# Patient Record
Sex: Male | Born: 1945 | Hispanic: Yes | Marital: Married | Smoking: Former smoker
Health system: Southern US, Community
[De-identification: ages and names within clinical notes are randomized; demographics above are authoritative.]

## PROBLEM LIST (undated history)

## (undated) DIAGNOSIS — E119 Type 2 diabetes mellitus without complications: Secondary | ICD-10-CM

## (undated) DIAGNOSIS — E785 Hyperlipidemia, unspecified: Secondary | ICD-10-CM

## (undated) DIAGNOSIS — I519 Heart disease, unspecified: Secondary | ICD-10-CM

## (undated) DIAGNOSIS — K219 Gastro-esophageal reflux disease without esophagitis: Secondary | ICD-10-CM

## (undated) DIAGNOSIS — H269 Unspecified cataract: Secondary | ICD-10-CM

## (undated) DIAGNOSIS — H409 Unspecified glaucoma: Secondary | ICD-10-CM

## (undated) DIAGNOSIS — M109 Gout, unspecified: Secondary | ICD-10-CM

## (undated) DIAGNOSIS — I10 Essential (primary) hypertension: Secondary | ICD-10-CM

## (undated) HISTORY — DX: Essential (primary) hypertension: I10

## (undated) HISTORY — DX: Hyperlipidemia, unspecified: E78.5

## (undated) HISTORY — DX: Heart disease, unspecified: I51.9

## (undated) HISTORY — DX: Gout, unspecified: M10.9

## (undated) HISTORY — DX: Unspecified glaucoma: H40.9

## (undated) HISTORY — DX: Gastro-esophageal reflux disease without esophagitis: K21.9

## (undated) HISTORY — DX: Type 2 diabetes mellitus without complications: E11.9

## (undated) HISTORY — PX: EYE SURGERY: SHX253

## (undated) HISTORY — PX: CHOLECYSTECTOMY: SHX55

## (undated) HISTORY — DX: Unspecified cataract: H26.9

---

## 2000-11-10 ENCOUNTER — Encounter: Payer: Self-pay | Admitting: Emergency Medicine

## 2000-11-11 ENCOUNTER — Inpatient Hospital Stay (HOSPITAL_COMMUNITY): Admission: EM | Admit: 2000-11-11 | Discharge: 2000-11-12 | Payer: Self-pay | Admitting: Emergency Medicine

## 2000-11-12 ENCOUNTER — Encounter: Payer: Self-pay | Admitting: Vascular Surgery

## 2005-10-10 ENCOUNTER — Emergency Department (HOSPITAL_COMMUNITY): Admission: EM | Admit: 2005-10-10 | Discharge: 2005-10-10 | Payer: Self-pay | Admitting: Emergency Medicine

## 2012-01-27 ENCOUNTER — Encounter (HOSPITAL_COMMUNITY): Payer: Self-pay | Admitting: Emergency Medicine

## 2012-01-27 ENCOUNTER — Emergency Department (HOSPITAL_COMMUNITY): Payer: Medicare Other

## 2012-01-27 ENCOUNTER — Emergency Department (HOSPITAL_COMMUNITY)
Admission: EM | Admit: 2012-01-27 | Discharge: 2012-01-27 | Disposition: A | Discharge status: Home / Self Care | Payer: Medicare Other | Attending: Emergency Medicine | Admitting: Emergency Medicine

## 2012-01-27 DIAGNOSIS — M549 Dorsalgia, unspecified: Secondary | ICD-10-CM | POA: Insufficient documentation

## 2012-01-27 DIAGNOSIS — Z79899 Other long term (current) drug therapy: Secondary | ICD-10-CM | POA: Insufficient documentation

## 2012-01-27 DIAGNOSIS — R109 Unspecified abdominal pain: Secondary | ICD-10-CM | POA: Insufficient documentation

## 2012-01-27 LAB — CBC WITH DIFFERENTIAL/PLATELET
Eosinophils Absolute: 0.1 10*3/uL (ref 0.0–0.7)
Eosinophils Relative: 2 % (ref 0–5)
HCT: 44 % (ref 39.0–52.0)
Hemoglobin: 14.9 g/dL (ref 13.0–17.0)
Lymphocytes Relative: 21 % (ref 12–46)
Lymphs Abs: 1.7 10*3/uL (ref 0.7–4.0)
MCH: 31.6 pg (ref 26.0–34.0)
MCV: 93.2 fL (ref 78.0–100.0)
Monocytes Absolute: 0.6 10*3/uL (ref 0.1–1.0)
Monocytes Relative: 7 % (ref 3–12)
Platelets: 228 10*3/uL (ref 150–400)
RBC: 4.72 MIL/uL (ref 4.22–5.81)
WBC: 8.3 10*3/uL (ref 4.0–10.5)

## 2012-01-27 LAB — URINALYSIS, MICROSCOPIC ONLY
Bilirubin Urine: NEGATIVE
Hgb urine dipstick: NEGATIVE
Nitrite: NEGATIVE
Protein, ur: NEGATIVE mg/dL
Urobilinogen, UA: 1 mg/dL (ref 0.0–1.0)

## 2012-01-27 LAB — LIPASE, BLOOD: Lipase: 24 U/L (ref 11–59)

## 2012-01-27 LAB — COMPREHENSIVE METABOLIC PANEL
ALT: 21 U/L (ref 0–53)
BUN: 15 mg/dL (ref 6–23)
CO2: 28 mEq/L (ref 19–32)
Calcium: 9.4 mg/dL (ref 8.4–10.5)
GFR calc Af Amer: >90 mL/min (ref >90)
GFR calc non Af Amer: 85 mL/min — ABNORMAL LOW (ref >90)
Glucose, Bld: 159 mg/dL — ABNORMAL HIGH (ref 70–99)
Sodium: 140 mEq/L (ref 135–145)

## 2012-01-27 MED ORDER — ONDANSETRON HCL 4 MG/2ML IJ SOLN
4.0000 mg | Freq: Once | INTRAMUSCULAR | Status: AC
  Administered 2012-01-27: 4 mg via INTRAVENOUS
  Filled 2012-01-27: qty 2

## 2012-01-27 MED ORDER — HYDROCODONE-ACETAMINOPHEN 5-500 MG PO TABS: 1.0000 | ORAL_TABLET | Freq: Four times a day (QID) | ORAL | Status: DC | PRN

## 2012-01-27 MED ORDER — HYDROMORPHONE HCL PF 1 MG/ML IJ SOLN
1.0000 mg | Freq: Once | INTRAMUSCULAR | Status: AC
  Administered 2012-01-27: 1 mg via INTRAVENOUS
  Filled 2012-01-27: qty 1

## 2012-01-27 NOTE — ED Notes (Signed)
Pt. C/o of right sided back and flank pain that occasionally shoots up to ribs with deep inspiration. X 1 week. Pt. Denies any urinary symptoms. Pt states he has burning with urination but this is normal for him. Pt. States he feels nauseous.

## 2012-01-27 NOTE — ED Provider Notes (Addendum)
History     CSN: [id number]  Arrival date & time 01/27/12  1234   First MD Initiated Contact with Patient 01/27/12 1343      Chief Complaint  Patient presents with  . Flank Pain    (Consider location/radiation/quality/duration/timing/severity/associated sxs/prior treatment) Patient is a 66 y.o. male presenting with flank pain. The history is provided by the patient.  Flank Pain Pertinent negatives include no chest pain, no abdominal pain, no headaches and no shortness of breath.  in past week pt c/o pain right mid to upper flank laterally and posteriorly. Non radiating. Constant, dull. Moderate to severe. Is constant but waxes and wanes in severity. No specific exacerbating or alleviating factors.  ?similar pain years ago with kidney stone. Has had gallbladder removed in past, denies other abd surgery. Nausea. No vomiting. Having normal bms. No constipation or diarrhea. No abd or flank trauma or strain. No cough or uri c/o. No fever or chills. No cp or sob. No lower abd pain, no scrotal or testicular pain or swelling.   History reviewed. No pertinent past medical history.  Past Surgical History  Procedure Date  . Cholecystectomy     History reviewed. No pertinent family history.  History  Substance Use Topics  . Smoking status: Never Smoker   . Smokeless tobacco: Not on file  . Alcohol Use: Yes     occasion      Review of Systems  Constitutional: Negative for fever.  HENT: Negative for neck pain.   Eyes: Negative for redness.  Respiratory: Negative for shortness of breath.   Cardiovascular: Negative for chest pain.  Gastrointestinal: Negative for abdominal pain.  Genitourinary: Positive for flank pain. Negative for dysuria and hematuria.  Musculoskeletal: Negative for back pain.  Skin: Negative for rash.  Neurological: Negative for headaches.  Hematological: Does not bruise/bleed easily.  Psychiatric/Behavioral: Negative for confusion.    Allergies   Penicillins  Home Medications   Current Outpatient Rx  Name Route Sig Dispense Refill  . BC HEADACHE PO Oral Take 1 packet by mouth daily as needed. For pain    . INDOMETHACIN 25 MG PO CAPS Oral Take 25 mg by mouth 3 (three) times daily with meals.    Marland Kitchen HYDROCODONE-ACETAMINOPHEN 5-500 MG PO TABS Oral Take 1-2 tablets by mouth every 6 (six) hours as needed for pain. 20 tablet 0    BP 141/88  Pulse 72  Temp 99.1 F (37.3 C) (Oral)  Resp 18  SpO2 96%  Physical Exam  Nursing note and vitals reviewed. Constitutional: He is oriented to person, place, and time. He appears well-developed and well-nourished. No distress.  HENT:  Head: Atraumatic.  Eyes: Pupils are equal, round, and reactive to light.  Neck: Neck supple. No tracheal deviation present.  Cardiovascular: Normal rate, regular rhythm, normal heart sounds and intact distal pulses.   Pulmonary/Chest: Effort normal and breath sounds normal. No accessory muscle usage. No respiratory distress. He exhibits no tenderness.  Abdominal: Soft. Bowel sounds are normal. He exhibits no distension and no mass. There is no tenderness. There is no rebound and no guarding.  Genitourinary:       No cva tenderness. No scrotal/testicular pain or tenderness. No hernia.   Musculoskeletal: Normal range of motion. He exhibits no edema and no tenderness.  Neurological: He is alert and oriented to person, place, and time.  Skin: Skin is warm and dry. No rash noted.       No rash/shingles in area of pain  Psychiatric: He  has a normal mood and affect.    ED Course  Procedures (including critical care time)  Results for orders placed during the hospital encounter of 01/27/12  CBC WITH DIFFERENTIAL      Component Value Range   WBC 8.3  4.0 - 10.5 K/uL   RBC 4.72  4.22 - 5.81 MIL/uL   Hemoglobin 14.9  13.0 - 17.0 g/dL   HCT 81.1  91.4 - 78.2 %   MCV 93.2  78.0 - 100.0 fL   MCH 31.6  26.0 - 34.0 pg   MCHC 33.9  30.0 - 36.0 g/dL   RDW 95.6  21.3  - 08.6 %   Platelets 228  150 - 400 K/uL   Neutrophils Relative 70  43 - 77 %   Neutro Abs 5.8  1.7 - 7.7 K/uL   Lymphocytes Relative 21  12 - 46 %   Lymphs Abs 1.7  0.7 - 4.0 K/uL   Monocytes Relative 7  3 - 12 %   Monocytes Absolute 0.6  0.1 - 1.0 K/uL   Eosinophils Relative 2  0 - 5 %   Eosinophils Absolute 0.1  0.0 - 0.7 K/uL   Basophils Relative 1  0 - 1 %   Basophils Absolute 0.0  0.0 - 0.1 K/uL  COMPREHENSIVE METABOLIC PANEL      Component Value Range   Sodium 140  135 - 145 mEq/L   Potassium 3.9  3.5 - 5.1 mEq/L   Chloride 103  96 - 112 mEq/L   CO2 28  19 - 32 mEq/L   Glucose, Bld 159 (*) 70 - 99 mg/dL   BUN 15  6 - 23 mg/dL   Creatinine, Ser 5.78  0.50 - 1.35 mg/dL   Calcium 9.4  8.4 - 46.9 mg/dL   Total Protein 7.4  6.0 - 8.3 g/dL   Albumin 4.1  3.5 - 5.2 g/dL   AST 17  0 - 37 U/L   ALT 21  0 - 53 U/L   Alkaline Phosphatase 91  39 - 117 U/L   Total Bilirubin 0.5  0.3 - 1.2 mg/dL   GFR calc non Af Amer 85 (*) >90 mL/min   GFR calc Af Amer >90  >90 mL/min  LIPASE, BLOOD      Component Value Range   Lipase 24  11 - 59 U/L  URINALYSIS, MICROSCOPIC ONLY      Component Value Range   Color, Urine YELLOW  YELLOW   APPearance CLEAR  CLEAR   Specific Gravity, Urine 1.026  1.005 - 1.030   pH 8.0  5.0 - 8.0   Glucose, UA NEGATIVE  NEGATIVE mg/dL   Hgb urine dipstick NEGATIVE  NEGATIVE   Bilirubin Urine NEGATIVE  NEGATIVE   Ketones, ur NEGATIVE  NEGATIVE mg/dL   Protein, ur NEGATIVE  NEGATIVE mg/dL   Urobilinogen, UA 1.0  0.0 - 1.0 mg/dL   Nitrite NEGATIVE  NEGATIVE   Leukocytes, UA NEGATIVE  NEGATIVE   Squamous Epithelial / LPF RARE  RARE   Ct Abdomen Pelvis Wo Contrast  01/27/2012  *RADIOLOGY REPORT*  Clinical Data: Right flank pain.  History of kidney stones.  CT ABDOMEN AND PELVIS WITHOUT CONTRAST  Technique:  Multidetector CT imaging of the abdomen and pelvis was performed following the standard protocol without intravenous contrast.  Comparison: None.  Findings:  The lung bases are clear.  No pericardial or pleural effusion noted.  The heart size appears normal.  Normal appearance of the liver.  Prior cholecystectomy.  No biliary dilatation.  The pancreas is normal.  The spleen is unremarkable.  Both adrenal glands are normal.  The right kidney is normal.  No right-sided hydronephrosis or nephrolithiasis.  The left kidney is also normal without evidence for nephrolithiasis or hydronephrosis. Urinary bladder appears within normal limits.  Calcification within the central portion of the prostate gland is noted.  The seminal vesicles appear unremarkable.  No enlarged upper abdominal lymph nodes.  There is no pelvic or inguinal adenopathy.  No ascites or fluid collections within the abdomen or pelvis.  The stomach is normal.  The small bowel loops are unremarkable. The appendix is visualized and appears normal.  Normal appearance of the colon.  Review of the visualized bony structures is significant for mild degenerative disc disease.  No acute bony abnormalities identified.  IMPRESSION:  1.  No acute findings identified within the abdomen or pelvis. 2.  No evidence for renal calculi or obstructive uropathy.   Original Report Authenticated By: Signa Kell, M.D.    Dg Chest 2 View  01/27/2012  *RADIOLOGY REPORT*  Clinical Data: Pain  CHEST - 2 VIEW  Comparison: 10/10/2005  Findings: The heart size and mediastinal contours are within normal limits.  Both lungs are clear.  The visualized skeletal structures are unremarkable.  IMPRESSION: No active cardiopulmonary abnormalities.   Original Report Authenticated By: Signa Kell, M.D.         MDM  Iv ns. Dilaudid 1 mg iv. Labs. Ct.  Reviewed nursing notes and prior charts for additional history.    Ct negative for acute process. On recheck pts pain mainly posterior/back, +pain w turning torso and palpation area. ?musculoskeletal pain. Chest cta. abd soft nt. No rash/shingles to area. Spine nt.         Suzi Roots, MD 01/27/12 1540  Suzi Roots, MD 01/27/12 1556

## 2012-01-27 NOTE — ED Notes (Signed)
Pt reports R flank pain for about a week that radiates to RUQ; pt reports nausea, denies dysuria

## 2015-01-19 DIAGNOSIS — H11003 Unspecified pterygium of eye, bilateral: Secondary | ICD-10-CM | POA: Insufficient documentation

## 2015-05-03 DIAGNOSIS — K219 Gastro-esophageal reflux disease without esophagitis: Secondary | ICD-10-CM | POA: Diagnosis not present

## 2015-05-03 DIAGNOSIS — N400 Enlarged prostate without lower urinary tract symptoms: Secondary | ICD-10-CM | POA: Diagnosis not present

## 2015-05-03 DIAGNOSIS — I10 Essential (primary) hypertension: Secondary | ICD-10-CM | POA: Diagnosis not present

## 2015-05-03 DIAGNOSIS — M109 Gout, unspecified: Secondary | ICD-10-CM | POA: Diagnosis not present

## 2015-05-03 DIAGNOSIS — E119 Type 2 diabetes mellitus without complications: Secondary | ICD-10-CM | POA: Diagnosis not present

## 2015-05-05 DIAGNOSIS — I10 Essential (primary) hypertension: Secondary | ICD-10-CM | POA: Diagnosis not present

## 2015-05-05 DIAGNOSIS — E559 Vitamin D deficiency, unspecified: Secondary | ICD-10-CM | POA: Diagnosis not present

## 2015-05-05 DIAGNOSIS — E119 Type 2 diabetes mellitus without complications: Secondary | ICD-10-CM | POA: Diagnosis not present

## 2015-05-05 DIAGNOSIS — M109 Gout, unspecified: Secondary | ICD-10-CM | POA: Diagnosis not present

## 2015-05-05 DIAGNOSIS — R5383 Other fatigue: Secondary | ICD-10-CM | POA: Diagnosis not present

## 2015-09-08 DIAGNOSIS — H5713 Ocular pain, bilateral: Secondary | ICD-10-CM | POA: Diagnosis not present

## 2015-09-08 DIAGNOSIS — H04123 Dry eye syndrome of bilateral lacrimal glands: Secondary | ICD-10-CM | POA: Diagnosis not present

## 2015-09-08 DIAGNOSIS — H40043 Steroid responder, bilateral: Secondary | ICD-10-CM | POA: Diagnosis not present

## 2015-09-08 DIAGNOSIS — H00015 Hordeolum externum left lower eyelid: Secondary | ICD-10-CM | POA: Diagnosis not present

## 2015-10-18 DIAGNOSIS — H04123 Dry eye syndrome of bilateral lacrimal glands: Secondary | ICD-10-CM | POA: Diagnosis not present

## 2015-10-18 DIAGNOSIS — H00015 Hordeolum externum left lower eyelid: Secondary | ICD-10-CM | POA: Diagnosis not present

## 2015-10-18 DIAGNOSIS — H5713 Ocular pain, bilateral: Secondary | ICD-10-CM | POA: Diagnosis not present

## 2015-10-18 DIAGNOSIS — H40043 Steroid responder, bilateral: Secondary | ICD-10-CM | POA: Diagnosis not present

## 2016-01-19 DIAGNOSIS — H401131 Primary open-angle glaucoma, bilateral, mild stage: Secondary | ICD-10-CM | POA: Diagnosis not present

## 2016-01-19 DIAGNOSIS — H2513 Age-related nuclear cataract, bilateral: Secondary | ICD-10-CM | POA: Diagnosis not present

## 2016-01-19 DIAGNOSIS — H5713 Ocular pain, bilateral: Secondary | ICD-10-CM | POA: Diagnosis not present

## 2016-01-19 DIAGNOSIS — H40043 Steroid responder, bilateral: Secondary | ICD-10-CM | POA: Diagnosis not present

## 2016-02-26 DIAGNOSIS — Z23 Encounter for immunization: Secondary | ICD-10-CM | POA: Diagnosis not present

## 2016-09-14 ENCOUNTER — Ambulatory Visit (INDEPENDENT_AMBULATORY_CARE_PROVIDER_SITE_OTHER): Payer: Medicare Other | Admitting: Physician Assistant

## 2016-09-14 ENCOUNTER — Encounter: Payer: Self-pay | Admitting: Physician Assistant

## 2016-09-14 VITALS — BP 108/70 | HR 81 | Temp 97.8°F | Resp 16 | Ht 63.0 in | Wt 234.8 lb

## 2016-09-14 DIAGNOSIS — Z0000 Encounter for general adult medical examination without abnormal findings: Secondary | ICD-10-CM

## 2016-09-14 DIAGNOSIS — E119 Type 2 diabetes mellitus without complications: Secondary | ICD-10-CM | POA: Insufficient documentation

## 2016-09-14 DIAGNOSIS — Z7689 Persons encountering health services in other specified circumstances: Secondary | ICD-10-CM

## 2016-09-14 DIAGNOSIS — I10 Essential (primary) hypertension: Secondary | ICD-10-CM

## 2016-09-14 DIAGNOSIS — Z23 Encounter for immunization: Secondary | ICD-10-CM | POA: Diagnosis not present

## 2016-09-14 NOTE — Progress Notes (Signed)
Patient ID: Patrick Marshall MRN: [id number], DOB: Feb 14, 1946 71 y.o. Date of Encounter: 09/14/2016, 11:12 AM    Chief Complaint: Physical (CPE)  HPI: 71 y.o. y/o male here for CPE.   He presents as a new patient to establish care. He had been seeing Delia Chimes but her practice is closed so he is here to establish.  His daughter who is 69 years old is here with him for visit. He speaks some [place] but she speaks more [place]. She helps to translate.  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  Daughter states that she is 19 years old and at home it is the 2 of them. Patient states that he also has another daughter who lives in [place] but she has her own family and is usually too busy to come with him to his appointments.  He has diabetes and hypertension. These are his only known medical problems.  I asked about eye exam. He states that he his last eye exam was 4 or 5 months ago. States that he sees someone in [place]. Says that they always call him when he is due for another visit.  He has no specific complaints or concerns to address today.   Review of Systems: Consitutional: No fever, chills, fatigue, night sweats, lymphadenopathy, or weight changes. Eyes: No visual changes, eye redness, or discharge. ENT/Mouth: Ears: No otalgia, tinnitus, hearing loss, discharge. Nose: No congestion, rhinorrhea, sinus pain, or epistaxis. Throat: No sore throat, post nasal drip, or teeth pain. Cardiovascular: No CP, palpitations, diaphoresis, DOE, edema, orthopnea, PND. Respiratory: No cough, hemoptysis, SOB, or wheezing. Gastrointestinal: No anorexia, dysphagia, reflux, pain, nausea, vomiting, hematemesis, diarrhea, constipation, BRBPR, or melena. Genitourinary: No dysuria, frequency, urgency, hematuria, incontinence, nocturia, decreased urinary stream, discharge, impotence, or testicular pain/masses. Musculoskeletal: No  decreased ROM, myalgias, stiffness, joint swelling, or weakness. Skin: No rash, erythema, lesion changes, pain, warmth, jaundice, or pruritis. Neurological: No headache, dizziness, syncope, seizures, tremors, memory loss, coordination problems, or paresthesias. Psychological: No anxiety, depression, hallucinations, SI/HI. Endocrine: Known diabetes. All other systems were reviewed and are otherwise negative.  Past Medical History:  Diagnosis Date  . Diabetes mellitus without complication ([place])   . Hypertension      Past Surgical History:  Procedure Laterality Date  . CHOLECYSTECTOMY    . EYE SURGERY     2016 both eyes    Home Meds:  Outpatient Medications Prior to Visit  Medication Sig Dispense Refill  . Aspirin-Salicylamide-Caffeine (BC HEADACHE PO) Take 1 packet by mouth daily as needed. For pain    . HYDROcodone-acetaminophen (VICODIN) 5-500 MG per tablet Take 1-2 tablets by mouth every 6 (six) hours as needed for pain. 20 tablet 0  . indomethacin (INDOCIN) 25 MG capsule Take 25 mg by mouth 3 (three) times daily with meals.     No facility-administered medications prior to visit.     Allergies:  Allergies  Allergen Reactions  . Penicillins Other (See Comments)    Difficulty sleeping    Social History   Social History  . Marital status: Married    Spouse name: N/A  . Number of children: N/A  . Years of education: N/A   Occupational History  . Not on file.   Social History Main Topics  . Smoking status: Never Smoker  . Smokeless tobacco: Never Used  . Alcohol use Yes     Comment: occasion  . Drug use: No  .  Sexual activity: Not on file   Other Topics Concern  . Not on file   Social History Narrative  . No narrative on file    No family history on file.  Physical Exam: Blood pressure 108/70, pulse 81, temperature 97.8 F (36.6 C), temperature source Oral, resp. rate 16, height 5\' 3"  (1.6 m), weight 234 lb 12.8 oz (106.5 kg), SpO2 97 %.  General:  Mildly obese Hispanic Male. Appears in no acute distress. HEENT: Normocephalic, atraumatic. Conjunctiva pink, sclera non-icteric. Pupils 2 mm constricting to 1 mm, round, regular, and equally reactive to light and accomodation. EOMI. Internal auditory canal clear. TMs with good cone of light and without pathology. Nasal mucosa pink. Nares are without discharge. No sinus tenderness. Oral mucosa pink. Pharynx without exudate.   Neck: Supple. Trachea midline. No thyromegaly. Full ROM. No lymphadenopathy. No carotid bruit. Lungs: Clear to auscultation bilaterally without wheezes, rales, or rhonchi. Breathing is of normal effort and unlabored. Cardiovascular: RRR with S1 S2. No murmurs, rubs, or gallops. No carotid or abdominal bruits. Abdomen: Soft, non-tender, non-distended with normoactive bowel sounds. No hepatosplenomegaly or masses. No rebound/guarding. No CVA tenderness. No hernias. Musculoskeletal: Full range of motion and 5/5 strength throughout.  Skin: Warm and moist without erythema, ecchymosis, wounds, or rash. Neuro: A+Ox3. CN II-XII grossly intact. Moves all extremities spontaneously. Full sensation throughout. Normal gait.  Diabetic foot exam: Inspection is normal. He has no wounds. No problem areas. He has no palpable dorsalis pedis or posterior tibial pulses bilaterally. Psych:  Responds to questions appropriately with a normal affect.   Assessment/Plan:  71 y.o. y/o  male here for CPE  -1. Encounter for preventive health examination A. Screening Labs: - CBC with Differential/Platelet; Future - COMPLETE METABOLIC PANEL WITH GFR; Future - Lipid panel; Future - TSH; Future - PSA; Future - Hemoglobin A1c; Future   B. Screening For Prostate Cancer: - PSA; Future  C. Screening For Colorectal Cancer:  He states that he has never had a colonoscopy. Discussed with them recommendations for colorectal cancer screening and fact that colonoscopy is the best test to evaluate for this. He  is agreeable to see GI for colonoscopy. - Ambulatory referral to Gastroenterology  D. Immunizations: Flu------------------N/A Tetanus-------- he states that he has had no tetanus vaccine and at least 10 years. He is agreeable to get this today especially given his work on a farm highly recommended. T dap given here 09/14/16 Pneumococcal--- given Pneumovax 23 today. Give Prevnar 13 6-12 months. Shingrix-----will wait to discuss this at future visits since we have so much to address today.   2. Medicare annual wellness visit, subsequent  - CBC with Differential/Platelet; Future - COMPLETE METABOLIC PANEL WITH GFR; Future - Lipid panel; Future - TSH; Future - PSA; Future - Hemoglobin A1c; Future  3. Controlled type 2 diabetes mellitus without complication, without long-term current use of insulin ([place])  He is on metformin. He is on ACE inhibitor. -----------AT NEXT OV--WILL DISCUSS ASPIRIN-----ALSO WILL F/U RESULTS OF FLP---ADD STATIN IF LDL > 70------------   - Hemoglobin A1c; Future -MicroAlbumin  Today I discussed proper foot care and he voices understanding and agrees.  He is already having routine eye exams.   4. Essential hypertension Blood pressure is at goal. Will continue current medication. Will check lab to monitor. - COMPLETE METABOLIC PANEL WITH GFR; Future   Will schedule routine follow-up visit 3 months. Follow-up sooner if needed.   Subjective:   Patient presents for Medicare Annual/Subsequent preventive examination.  Review Past Medical/Family/Social: All of this information was reviewed today.  Risk Factors  Current exercise habits: He works on a tobacco farm. Dietary issues discussed: He has been educated regarding diabetic diet.  Cardiac risk factors: Obesity, diabetes, hypertension  Depression Screen  (Note: if answer to either of the following is "Yes", a more complete depression screening is indicated)  Over the past two weeks, have you felt  down, depressed or hopeless? No Over the past two weeks, have you felt little interest or pleasure in doing things? No Have you lost interest or pleasure in daily life? No Do you often feel hopeless? No Do you cry easily over simple problems? No   Activities of Daily Living  In your present state of health, do you have any difficulty performing the following activities?:  Driving? No  Managing money? No  Feeding yourself? No  Getting from bed to chair? No  Climbing a flight of stairs? No  Preparing food and eating?: No  Bathing or showering? No  Getting dressed: No  Getting to the toilet? No  Using the toilet:No  Moving around from place to place: No  In the past year have you fallen or had a near fall?:No  Are you sexually active? No  Do you have more than one partner? No   Hearing Difficulties: No  Do you often ask people to speak up or repeat themselves? No  Do you experience ringing or noises in your ears? No Do you have difficulty understanding soft or whispered voices? No  Do you feel that you have a problem with memory? No Do you often misplace items? No  Do you feel safe at home? Yes  Cognitive Testing  Alert? Yes Normal Appearance?Yes  Oriented to person? Yes Place? Yes  Time? Yes  Recall of three objects? Yes  Can perform simple calculations? Yes  Displays appropriate judgment?Yes  Can read the correct time from a watch face?Yes   List the Names of Other Physician/Practitioners you currently use:  He sees an eye doctor. This is the only other practitioner he sees.  Indicate any recent Medical Services you may have received from other than [facility] providers in the past year (date may be approximate).  None Screening Tests / Date Colonoscopy  ----so far he has never had a colonoscopy. Today I have referred him to GI for colonoscopy.                   Zostavax --------- will discuss shingles vaccine at upcoming visit. 2 much to discuss today. Mammogram  --n/a Influenza Vaccine --na Tetanus/tdap----T dap given today 09/14/16     Assessment:    Annual wellness medicare exam   Plan:    During the course of the visit the patient was educated and counseled about appropriate screening and preventive services including:  Screening mammography  Colorectal cancer screening  Shingles vaccine. Prescription given to that she can get the vaccine at the pharmacy or Medicare part D.  Screen + for depression. 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 We discussed the options of counseling versus possibly a medication. I encouraged her strongly think about the counseling. She is going through some medical problems currently and her husband is as well Mrs. been very stressful for her. She says she will think about it. She does have Xanax to use as needed. Though she may benefit from an SSRI for her more depressive type symptoms but she wants to hold off at this time.  I aksed her to please have her cardioloist send records since we have none on file.  Diet review for nutrition referral? Yes ____ Not Indicated __x__  Patient Instructions (the written plan) was given to the patient.  Medicare Attestation  I have personally reviewed:  The patient's medical and social history  Their use of alcohol, tobacco or illicit drugs  Their current medications and supplements  The patient's functional ability including ADLs,fall risks, home safety risks, cognitive, and hearing and visual impairment  Diet and physical activities  Evidence for depression or mood disorders  The patient's weight, height, BMI, and visual acuity have been recorded in the chart. I have made referrals, counseling, and provided education to the patient based on review of the above and I have provided the patient with a written personalized care plan for preventive services.        Signed:   [address] [place], [place]  09/14/2016 11:12 AM

## 2016-09-14 NOTE — Addendum Note (Signed)
Addended by: Vonna Kotyk A on: 09/14/2016 05:14 PM   Modules accepted: Orders

## 2016-09-15 ENCOUNTER — Other Ambulatory Visit: Payer: Medicare Other

## 2016-09-15 DIAGNOSIS — E119 Type 2 diabetes mellitus without complications: Secondary | ICD-10-CM | POA: Diagnosis not present

## 2016-09-15 DIAGNOSIS — Z0000 Encounter for general adult medical examination without abnormal findings: Secondary | ICD-10-CM | POA: Diagnosis not present

## 2016-09-15 DIAGNOSIS — I10 Essential (primary) hypertension: Secondary | ICD-10-CM

## 2016-09-15 LAB — CBC WITH DIFFERENTIAL/PLATELET
Basophils Absolute: 66 cells/uL (ref 0–200)
Basophils Relative: 1 %
Eosinophils Absolute: 396 cells/uL (ref 15–500)
Eosinophils Relative: 6 %
HEMATOCRIT: 40 % (ref 38.5–50.0)
HEMOGLOBIN: 12.9 g/dL — AB (ref 13.0–17.0)
LYMPHS ABS: 1914 {cells}/uL (ref 850–3900)
Lymphocytes Relative: 29 %
MCH: 29.3 pg (ref 27.0–33.0)
MCHC: 32.3 g/dL (ref 32.0–36.0)
MCV: 90.7 fL (ref 80.0–100.0)
MPV: 10.7 fL (ref 7.5–12.5)
Monocytes Absolute: 528 cells/uL (ref 200–950)
Monocytes Relative: 8 %
NEUTROS PCT: 56 %
Neutro Abs: 3696 cells/uL (ref 1500–7800)
Platelets: 243 10*3/uL (ref 140–400)
RBC: 4.41 MIL/uL (ref 4.20–5.80)
RDW: 14.9 % (ref 11.0–15.0)
WBC: 6.6 10*3/uL (ref 3.8–10.8)

## 2016-09-15 LAB — COMPLETE METABOLIC PANEL WITH GFR
ALT: 19 U/L (ref 9–46)
AST: 16 U/L (ref 10–35)
Albumin: 4.1 g/dL (ref 3.6–5.1)
Alkaline Phosphatase: 71 U/L (ref 40–115)
BUN: 21 mg/dL (ref 7–25)
CALCIUM: 8.7 mg/dL (ref 8.6–10.3)
CO2: 22 mmol/L (ref 20–31)
Chloride: 106 mmol/L (ref 98–110)
Creat: 1.49 mg/dL — ABNORMAL HIGH (ref 0.70–1.25)
GFR, Est African American: 55 mL/min — ABNORMAL LOW (ref >=60)
GFR, Est Non African American: 47 mL/min — ABNORMAL LOW (ref >=60)
GLUCOSE: 109 mg/dL — AB (ref 70–99)
POTASSIUM: 5 mmol/L (ref 3.5–5.3)
Sodium: 139 mmol/L (ref 135–146)
Total Bilirubin: 0.4 mg/dL (ref 0.2–1.2)
Total Protein: 6.5 g/dL (ref 6.1–8.1)

## 2016-09-15 LAB — LIPID PANEL
CHOL/HDL RATIO: 3.5 ratio (ref <5.0)
Cholesterol: 158 mg/dL (ref <200)
HDL: 45 mg/dL (ref >40)
LDL CALC: 93 mg/dL (ref <100)
TRIGLYCERIDES: 101 mg/dL (ref <150)
VLDL: 20 mg/dL (ref <30)

## 2016-09-15 LAB — MICROALBUMIN, URINE: Microalb, Ur: 0.4 mg/dL

## 2016-09-16 LAB — PSA: PSA: 2.9 ng/mL (ref <=4.0)

## 2016-09-16 LAB — HEMOGLOBIN A1C
Hgb A1c MFr Bld: 6.7 % — ABNORMAL HIGH (ref <5.7)
Mean Plasma Glucose: 146 mg/dL

## 2016-09-16 LAB — TSH: TSH: 1.88 m[IU]/L (ref 0.40–4.50)

## 2016-09-19 ENCOUNTER — Other Ambulatory Visit: Payer: Self-pay

## 2016-09-19 DIAGNOSIS — R799 Abnormal finding of blood chemistry, unspecified: Principal | ICD-10-CM

## 2016-09-19 DIAGNOSIS — R7989 Other specified abnormal findings of blood chemistry: Secondary | ICD-10-CM

## 2016-09-19 MED ORDER — SIMVASTATIN 10 MG PO TABS: 10.0000 mg | ORAL_TABLET | Freq: Every day | ORAL | 1 refills | Status: DC

## 2016-10-16 ENCOUNTER — Encounter: Payer: Self-pay | Admitting: Physician Assistant

## 2016-10-31 ENCOUNTER — Other Ambulatory Visit: Payer: Medicare Other

## 2016-10-31 DIAGNOSIS — R799 Abnormal finding of blood chemistry, unspecified: Secondary | ICD-10-CM | POA: Diagnosis not present

## 2016-10-31 DIAGNOSIS — R7989 Other specified abnormal findings of blood chemistry: Secondary | ICD-10-CM

## 2016-10-31 LAB — COMPREHENSIVE METABOLIC PANEL
ALBUMIN: 4.4 g/dL (ref 3.6–5.1)
ALT: 20 U/L (ref 9–46)
AST: 18 U/L (ref 10–35)
Alkaline Phosphatase: 65 U/L (ref 40–115)
BUN: 22 mg/dL (ref 7–25)
CALCIUM: 9.3 mg/dL (ref 8.6–10.3)
CHLORIDE: 100 mmol/L (ref 98–110)
CO2: 24 mmol/L (ref 20–32)
Creat: 1.22 mg/dL (ref 0.70–1.25)
GLUCOSE: 124 mg/dL — AB (ref 70–99)
Potassium: 4.4 mmol/L (ref 3.5–5.3)
SODIUM: 136 mmol/L (ref 135–146)
Total Bilirubin: 0.6 mg/dL (ref 0.2–1.2)
Total Protein: 6.8 g/dL (ref 6.1–8.1)

## 2016-10-31 LAB — LIPID PANEL
CHOLESTEROL: 134 mg/dL (ref <200)
HDL: 53 mg/dL (ref >40)
LDL Cholesterol: 50 mg/dL (ref <100)
Total CHOL/HDL Ratio: 2.5 Ratio (ref <5.0)
Triglycerides: 157 mg/dL — ABNORMAL HIGH (ref <150)
VLDL: 31 mg/dL — AB (ref <30)

## 2016-11-24 ENCOUNTER — Encounter (HOSPITAL_COMMUNITY): Payer: Self-pay | Admitting: *Deleted

## 2016-11-24 ENCOUNTER — Emergency Department (HOSPITAL_COMMUNITY)
Admission: EM | Admit: 2016-11-24 | Discharge: 2016-11-24 | Disposition: A | Discharge status: Home / Self Care | Payer: Medicare Other | Attending: Emergency Medicine | Admitting: Emergency Medicine

## 2016-11-24 ENCOUNTER — Telehealth: Payer: Self-pay | Admitting: Physician Assistant

## 2016-11-24 DIAGNOSIS — K0889 Other specified disorders of teeth and supporting structures: Secondary | ICD-10-CM | POA: Insufficient documentation

## 2016-11-24 DIAGNOSIS — K029 Dental caries, unspecified: Secondary | ICD-10-CM | POA: Diagnosis not present

## 2016-11-24 DIAGNOSIS — I10 Essential (primary) hypertension: Secondary | ICD-10-CM | POA: Insufficient documentation

## 2016-11-24 DIAGNOSIS — E119 Type 2 diabetes mellitus without complications: Secondary | ICD-10-CM | POA: Diagnosis not present

## 2016-11-24 DIAGNOSIS — Z7984 Long term (current) use of oral hypoglycemic drugs: Secondary | ICD-10-CM | POA: Insufficient documentation

## 2016-11-24 MED ORDER — CLINDAMYCIN HCL 150 MG PO CAPS: 300.0000 mg | ORAL_CAPSULE | Freq: Three times a day (TID) | ORAL | 0 refills | Status: DC

## 2016-11-24 NOTE — ED Notes (Signed)
C/o right upper dental pain x 3 weeks.

## 2016-11-24 NOTE — ED Provider Notes (Signed)
[facility] DEPT Provider Note   CSN: [id number] Arrival date & time: 11/24/16  1130     History   Chief Complaint Chief Complaint  Patient presents with  . Dental Pain    HPI Patrick Marshall is a 71 y.o. male.  Patrick Marshall is a 71 y.o. Male who presents to the emergency department complaining of right upper dental pain for the past 2 weeks. He reports his pain is worse with eating and with cold liquids. He tells me he has been taking medication from his home country for a few days with little relief. He has a pill of augmentin which he reports taking once and some pain pills that have tylenol and naproxen in them. He does not have a dental provider. He denies fevers, discharge out of his mouth, sore throat, trouble swallowing, or rashes.    The history is provided by the patient and medical records. No language interpreter was used.  Dental Pain      Past Medical History:  Diagnosis Date  . Diabetes mellitus without complication ([place])   . Hypertension     Patient Active Problem List   Diagnosis Date Noted  . Controlled type 2 diabetes mellitus without complication, without long-term current use of insulin ([place]) 09/14/2016  . Hypertension 09/14/2016    Past Surgical History:  Procedure Laterality Date  . CHOLECYSTECTOMY    . EYE SURGERY     2016 both eyes       Home Medications    Prior to Admission medications   Medication Sig Start Date End Date Taking? Authorizing Provider  clindamycin (CLEOCIN) 150 MG capsule Take 2 capsules (300 mg total) by mouth 3 (three) times daily. May dispense as 150mg  capsules 11/24/16   Waynetta Pean, PA-C  lisinopril (PRINIVIL,ZESTRIL) 10 MG tablet Take 10 mg by mouth daily.    [provider]  metFORMIN (GLUCOPHAGE) 500 MG tablet Take 500 mg by mouth 2 (two) times daily.    [provider]  simvastatin (ZOCOR) 10 MG tablet Take 1 tablet (10 mg total) by mouth at bedtime. 09/19/16   Orlena Sheldon, PA-C      Family History History reviewed. No pertinent family history.  Social History Social History  Substance Use Topics  . Smoking status: Never Smoker  . Smokeless tobacco: Never Used  . Alcohol use Yes     Comment: occasion     Allergies   Penicillins   Review of Systems Review of Systems  Constitutional: Negative for chills and fever.  HENT: Positive for dental problem. Negative for drooling, ear pain, facial swelling, sore throat and trouble swallowing.   Eyes: Negative for pain and visual disturbance.  Musculoskeletal: Negative for neck pain and neck stiffness.  Skin: Negative for rash.  Neurological: Negative for headaches.     Physical Exam Updated Vital Signs BP 124/89 (BP Location: Right Arm)   Pulse 86   Temp (!) 97.5 F (36.4 C) (Oral)   Resp 18   SpO2 100%   Physical Exam  Constitutional: He appears well-developed and well-nourished. No distress.  Non-toxic appearing.   HENT:  Head: Normocephalic and atraumatic.  Right Ear: External ear normal.  Left Ear: External ear normal.  Mouth/Throat: Oropharynx is clear and moist. No oropharyngeal exudate.  Tenderness to right upper incisor. No acute findings. Generally poor dentition.  No discharge from the mouth. No facial swelling.  Uvula is midline without edema. Soft palate rises symmetrically. No tonsillar hypertrophy or exudates. Tongue protrusion  is normal. No trismus. No PTA.  Bilateral tympanic membranes are pearly-gray without erythema or loss of landmarks.   Eyes: Pupils are equal, round, and reactive to light. Conjunctivae are normal. Right eye exhibits no discharge. Left eye exhibits no discharge.  Neck: Normal range of motion. Neck supple. No JVD present. No tracheal deviation present.  Cardiovascular: Normal rate and intact distal pulses.   Pulmonary/Chest: Effort normal. No stridor. No respiratory distress.  Lymphadenopathy:    He has no cervical adenopathy.  Neurological: He is alert.  Coordination normal.  Skin: Skin is warm and dry. No rash noted. He is not diaphoretic.  Psychiatric: He has a normal mood and affect. His behavior is normal.  Nursing note and vitals reviewed.    ED Treatments / Results  Labs (all labs ordered are listed, but only abnormal results are displayed) Labs Reviewed - No data to display  EKG  EKG Interpretation None       Radiology No results found.  Procedures Procedures (including critical care time)  Medications Ordered in ED Medications - No data to display   Initial Impression / Assessment and Plan / ED Course  I have reviewed the triage vital signs and the nursing notes.  Pertinent labs & imaging results that were available during my care of the patient were reviewed by me and considered in my medical decision making (see chart for details).    This is a 71 y.o. Male who presents to the emergency department complaining of right upper dental pain for the past 2 weeks. He reports his pain is worse with eating and with cold liquids. He tells me he has been taking medication from his home country for a few days with little relief. He has a pill of augmentin which he reports taking once and some pain pills that have tylenol and naproxen in them.  Patient with toothache.  No gross abscess.  Exam unconcerning for Ludwig's angina or spread of infection.  He tells me he had 1 dose of the Augmentin. Says he is an allergy to penicillins. He tells me he is unsure. He tells me now he has one other pill of Augmentin. I advised him to discontinue this and will place him on a course of clindamycin. I advised he continue taking the pain pills that he was prescribed by his doctor in [place] own [place]. They are to have Tylenol in them which is what I would plan on giving him. I provided him with a dental resource guide and encouraged him to follow-up with dentistry. I advised the patient to follow-up with their primary care provider this week. I  advised the patient to return to the emergency department with new or worsening symptoms or new concerns. The patient verbalized understanding and agreement with plan.     Final Clinical Impressions(s) / ED Diagnoses   Final diagnoses:  Pain, dental  Dental caries    New Prescriptions New Prescriptions   CLINDAMYCIN (CLEOCIN) 150 MG CAPSULE    Take 2 capsules (300 mg total) by mouth 3 (three) times daily. May dispense as 150mg  capsules     Waynetta Pean, Hershal Coria 11/24/16 [place]    Lacretia Leigh, MD 11/24/16 1415

## 2016-11-24 NOTE — ED Triage Notes (Signed)
Pt reports hx of root canal, now having pain to that tooth x 2-3 weeks. Difficulty eating and sleeping. Airway intact, no swelling noted.

## 2016-11-28 ENCOUNTER — Other Ambulatory Visit: Payer: Self-pay | Admitting: Family Medicine

## 2016-11-28 MED ORDER — SIMVASTATIN 10 MG PO TABS: 10.0000 mg | ORAL_TABLET | Freq: Every day | ORAL | 1 refills | Status: DC

## 2016-11-28 NOTE — Telephone Encounter (Signed)
Pharm requesting 90 day supply on simvastatin - med sent to pharm for 90 day supply

## 2016-11-29 NOTE — Telephone Encounter (Signed)
Error

## 2016-12-18 ENCOUNTER — Ambulatory Visit: Payer: Medicare Other | Admitting: Physician Assistant

## 2016-12-21 ENCOUNTER — Encounter: Payer: Self-pay | Admitting: Physician Assistant

## 2017-03-09 ENCOUNTER — Telehealth: Payer: Self-pay | Admitting: Physician Assistant

## 2017-03-09 MED ORDER — LISINOPRIL 10 MG PO TABS: 10.0000 mg | ORAL_TABLET | Freq: Every day | ORAL | 0 refills | Status: DC

## 2017-03-09 MED ORDER — METFORMIN HCL 500 MG PO TABS: 500.0000 mg | ORAL_TABLET | Freq: Two times a day (BID) | ORAL | 0 refills | Status: DC

## 2017-03-09 NOTE — Telephone Encounter (Signed)
Rx sent to pharmacy   

## 2017-03-09 NOTE — Telephone Encounter (Signed)
Patient has appt Monday, however would like to get refill on his lisinopril and metformin if possible  He is out!  [facility]

## 2017-03-12 ENCOUNTER — Other Ambulatory Visit: Payer: Self-pay

## 2017-03-12 ENCOUNTER — Encounter: Payer: Self-pay | Admitting: Physician Assistant

## 2017-03-12 ENCOUNTER — Ambulatory Visit: Payer: Medicare Other | Admitting: Physician Assistant

## 2017-03-12 VITALS — BP 128/90 | HR 92 | Temp 97.9°F | Resp 18 | Wt 237.2 lb

## 2017-03-12 DIAGNOSIS — Z23 Encounter for immunization: Secondary | ICD-10-CM | POA: Diagnosis not present

## 2017-03-12 DIAGNOSIS — Z1159 Encounter for screening for other viral diseases: Secondary | ICD-10-CM | POA: Diagnosis not present

## 2017-03-12 DIAGNOSIS — E119 Type 2 diabetes mellitus without complications: Secondary | ICD-10-CM | POA: Diagnosis not present

## 2017-03-12 DIAGNOSIS — E785 Hyperlipidemia, unspecified: Secondary | ICD-10-CM | POA: Diagnosis not present

## 2017-03-12 DIAGNOSIS — I10 Essential (primary) hypertension: Secondary | ICD-10-CM

## 2017-03-12 MED ORDER — ASPIRIN EC 81 MG PO TBEC: 81.0000 mg | DELAYED_RELEASE_TABLET | Freq: Every day | ORAL | 2 refills | Status: DC

## 2017-03-12 NOTE — Progress Notes (Signed)
Patient ID: Patrick Marshall MRN: [id number], DOB: 1945/05/13 71 y.o. Date of Encounter: 03/12/2017, 10:30 AM    Chief Complaint: Routine f/u OV  HPI: 71 y.o. y/o male here for    09/14/2016: He presents as a new patient to establish care. He had been seeing Delia Chimes but her practice is closed so he is here to establish.  His daughter who is 69 years old is here with him for visit. He speaks some [place] but she speaks more [place]. She helps to translate.  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  Daughter states that she is 15 years old and at home it is the 2 of them. Patient states that he also has another daughter who lives in [place] but she has her own family and is usually too busy to come with him to his appointments.  He has diabetes and hypertension. These are his only known medical problems.  I asked about eye exam. He states that he his last eye exam was 4 or 5 months ago. States that he sees someone in [place]. Says that they always call him when he is due for another visit.  He has no specific complaints or concerns to address today.    03/12/2017: At his visit 09/14/16 labs were obtained.  His cholesterol showed LDL 93.  Given that he had diabetes I have him start simvastatin 10 mg.  Did start that as directed and did come in for follow-up labs 10/31/16.  We will was down to 50. Other labs were good lab check 09/14/16. Today he reports that he continues to take the simvastatin daily.  This is causing no myalgias or other adverse effects. He is also taking his medication for diabetes as directed.  This is causing no adverse effects. Also taking blood pressure medication as directed.  This is causing no lightheadedness or other adverse effects. He also reports that he is taking a baby aspirin daily.  This is added to med list today. He has no specific concerns today. He has been feeling  fine. Notes that he has not been working as much during these winter months and plus be been having lots of rain.   Review of Systems: Consitutional: No fever, chills, fatigue, night sweats, lymphadenopathy, or weight changes. Eyes: No visual changes, eye redness, or discharge. ENT/Mouth: Ears: No otalgia, tinnitus, hearing loss, discharge. Nose: No congestion, rhinorrhea, sinus pain, or epistaxis. Throat: No sore throat, post nasal drip, or teeth pain. Cardiovascular: No CP, palpitations, diaphoresis, DOE, edema, orthopnea, PND. Respiratory: No cough, hemoptysis, SOB, or wheezing. Gastrointestinal: No anorexia, dysphagia, reflux, pain, nausea, vomiting, hematemesis, diarrhea, constipation, BRBPR, or melena. Genitourinary: No dysuria, frequency, urgency, hematuria, incontinence, nocturia, decreased urinary stream, discharge, impotence, or testicular pain/masses. Musculoskeletal: No decreased ROM, myalgias, stiffness, joint swelling, or weakness. Skin: No rash, erythema, lesion changes, pain, warmth, jaundice, or pruritis. Neurological: No headache, dizziness, syncope, seizures, tremors, memory loss, coordination problems, or paresthesias. Psychological: No anxiety, depression, hallucinations, SI/HI. Endocrine: Known diabetes. All other systems were reviewed and are otherwise negative.  Past Medical History:  Diagnosis Date  . Diabetes mellitus without complication ([place])   . Hypertension      Past Surgical History:  Procedure Laterality Date  . CHOLECYSTECTOMY    . EYE SURGERY     2016 both eyes    Home Meds:  Outpatient Medications Prior to Visit  Medication Sig Dispense Refill  .  clindamycin (CLEOCIN) 150 MG capsule Take 2 capsules (300 mg total) by mouth 3 (three) times daily. May dispense as 150mg  capsules 42 capsule 0  . lisinopril (PRINIVIL,ZESTRIL) 10 MG tablet Take 1 tablet (10 mg total) by mouth daily. 30 tablet 0  . metFORMIN (GLUCOPHAGE) 500 MG tablet Take 1 tablet (500  mg total) by mouth 2 (two) times daily. 60 tablet 0  . simvastatin (ZOCOR) 10 MG tablet Take 1 tablet (10 mg total) by mouth at bedtime. 90 tablet 1   No facility-administered medications prior to visit.     Allergies:  Allergies  Allergen Reactions  . Penicillins Other (See Comments)    Difficulty sleeping    Social History   Socioeconomic History  . Marital status: Married    Spouse name: Not on file  . Number of children: Not on file  . Years of education: Not on file  . Highest education level: Not on file  Social Needs  . Financial resource strain: Not on file  . Food insecurity - worry: Not on file  . Food insecurity - inability: Not on file  . Transportation needs - medical: Not on file  . Transportation needs - non-medical: Not on file  Occupational History  . Not on file  Tobacco Use  . Smoking status: Never Smoker  . Smokeless tobacco: Never Used  Substance and Sexual Activity  . Alcohol use: Yes    Comment: occasion  . Drug use: No  . Sexual activity: Not on file  Other Topics Concern  . Not on file  Social History Narrative  . Not on file    History reviewed. No pertinent family history.  Physical Exam: Blood pressure 128/90, pulse 92, temperature 97.9 F (36.6 C), temperature source Oral, resp. rate 18, weight 107.6 kg (237 lb 3.2 oz), SpO2 97 %.  General: Obese Hispanic Male. Appears in no acute distress. Neck: Supple. Trachea midline. No thyromegaly. Full ROM. No lymphadenopathy. No carotid bruit. Lungs: Clear to auscultation bilaterally without wheezes, rales, or rhonchi. Breathing is of normal effort and unlabored. Cardiovascular: RRR with S1 S2. No murmurs, rubs, or gallops. No carotid or abdominal bruits. Abdomen: Soft, non-tender, non-distended with normoactive bowel sounds. No hepatosplenomegaly or masses. No rebound/guarding. No CVA tenderness. No hernias. Musculoskeletal: Full range of motion and 5/5 strength throughout.  Skin: Warm and  moist without erythema, ecchymosis, wounds, or rash. Neuro: A+Ox3. CN II-XII grossly intact. Moves all extremities spontaneously. Full sensation throughout. Normal gait.  Diabetic foot exam: Inspection is normal. He has no wounds. No problem areas. He has no palpable dorsalis pedis or posterior tibial pulses bilaterally. Psych:  Responds to questions appropriately with a normal affect.   Assessment/Plan:  71 y.o. y/o  male here for  1. Hyperlipidemia, unspecified hyperlipidemia type He is taking his simvastatin 10 mg daily.  Did have small amount of coffee with cream in it this morning but otherwise is fasting so we will go ahead and check lab. - COMPLETE METABOLIC PANEL WITH GFR - Lipid panel  2. Essential hypertension Blood Pressure is at goal.  Continue current medication without change.  Check lab to monitor. - COMPLETE METABOLIC PANEL WITH GFR  3. Controlled type 2 diabetes mellitus without complication, without long-term current use of insulin ([place]) Check lab to monitor.  - COMPLETE METABOLIC PANEL WITH GFR - Hemoglobin A1c  4. Need for hepatitis C screening test - Hepatitis C antibody   For preventive care see his CPE note from 09/14/2016.  Plan for routine follow-up visit in 3 months.  Follow-up sooner if needed.  Signed:   [address] [place], [place]  03/12/2017 10:30 AM

## 2017-03-13 LAB — HEMOGLOBIN A1C
HEMOGLOBIN A1C: 6.8 %{Hb} — AB (ref <5.7)
Mean Plasma Glucose: 148 (calc)
eAG (mmol/L): 8.2 (calc)

## 2017-03-13 LAB — COMPLETE METABOLIC PANEL WITH GFR
AG Ratio: 1.7 (calc) (ref 1.0–2.5)
ALKALINE PHOSPHATASE (APISO): 54 U/L (ref 40–115)
ALT: 21 U/L (ref 9–46)
AST: 24 U/L (ref 10–35)
Albumin: 4.5 g/dL (ref 3.6–5.1)
BUN/Creatinine Ratio: 17 (calc) (ref 6–22)
BUN: 22 mg/dL (ref 7–25)
CHLORIDE: 100 mmol/L (ref 98–110)
CO2: 24 mmol/L (ref 20–32)
Calcium: 9.4 mg/dL (ref 8.6–10.3)
Creat: 1.29 mg/dL — ABNORMAL HIGH (ref 0.70–1.18)
GFR, Est African American: 65 mL/min/{1.73_m2} (ref >=60)
GFR, Est Non African American: 56 mL/min/{1.73_m2} — ABNORMAL LOW (ref >=60)
GLUCOSE: 114 mg/dL — AB (ref 65–99)
Globulin: 2.7 g/dL (calc) (ref 1.9–3.7)
POTASSIUM: 4.3 mmol/L (ref 3.5–5.3)
Sodium: 135 mmol/L (ref 135–146)
Total Bilirubin: 1 mg/dL (ref 0.2–1.2)
Total Protein: 7.2 g/dL (ref 6.1–8.1)

## 2017-03-13 LAB — LIPID PANEL
CHOLESTEROL: 133 mg/dL (ref <200)
HDL: 66 mg/dL (ref >40)
LDL CHOLESTEROL (CALC): 43 mg/dL
Non-HDL Cholesterol (Calc): 67 mg/dL (calc) (ref <130)
TRIGLYCERIDES: 160 mg/dL — AB (ref <150)
Total CHOL/HDL Ratio: 2 (calc) (ref <5.0)

## 2017-03-13 LAB — HEPATITIS C ANTIBODY
Hepatitis C Ab: NONREACTIVE
SIGNAL TO CUT-OFF: 0.01 (ref <1.00)

## 2017-04-17 ENCOUNTER — Telehealth: Payer: Self-pay | Admitting: Physician Assistant

## 2017-04-17 MED ORDER — LISINOPRIL 10 MG PO TABS: 10.0000 mg | ORAL_TABLET | Freq: Every day | ORAL | 0 refills | Status: DC

## 2017-04-17 MED ORDER — METFORMIN HCL 500 MG PO TABS: 500.0000 mg | ORAL_TABLET | Freq: Two times a day (BID) | ORAL | 0 refills | Status: DC

## 2017-04-17 NOTE — Telephone Encounter (Signed)
Refill on metformin and lisinopril sent to [facility]

## 2017-04-17 NOTE — Telephone Encounter (Signed)
Pt came in requesting refill on metformin and lisinopril [facility].

## 2017-06-01 DIAGNOSIS — E113293 Type 2 diabetes mellitus with mild nonproliferative diabetic retinopathy without macular edema, bilateral: Secondary | ICD-10-CM | POA: Diagnosis not present

## 2017-06-01 DIAGNOSIS — Z961 Presence of intraocular lens: Secondary | ICD-10-CM | POA: Diagnosis not present

## 2017-06-01 DIAGNOSIS — H401131 Primary open-angle glaucoma, bilateral, mild stage: Secondary | ICD-10-CM | POA: Diagnosis not present

## 2017-06-01 DIAGNOSIS — E113291 Type 2 diabetes mellitus with mild nonproliferative diabetic retinopathy without macular edema, right eye: Secondary | ICD-10-CM | POA: Diagnosis not present

## 2017-06-11 ENCOUNTER — Ambulatory Visit: Payer: Medicare Other | Admitting: Physician Assistant

## 2017-06-11 ENCOUNTER — Encounter: Payer: Self-pay | Admitting: Physician Assistant

## 2017-06-11 ENCOUNTER — Other Ambulatory Visit: Payer: Self-pay

## 2017-06-11 VITALS — BP 130/78 | HR 86 | Temp 98.0°F | Resp 16 | Ht 63.0 in | Wt 240.8 lb

## 2017-06-11 DIAGNOSIS — E119 Type 2 diabetes mellitus without complications: Secondary | ICD-10-CM | POA: Diagnosis not present

## 2017-06-11 DIAGNOSIS — M1000 Idiopathic gout, unspecified site: Secondary | ICD-10-CM

## 2017-06-11 DIAGNOSIS — E785 Hyperlipidemia, unspecified: Secondary | ICD-10-CM | POA: Diagnosis not present

## 2017-06-11 DIAGNOSIS — I10 Essential (primary) hypertension: Secondary | ICD-10-CM | POA: Diagnosis not present

## 2017-06-11 NOTE — Progress Notes (Signed)
Patient ID: Patrick Marshall MRN: [id number], DOB: March 28, 1945 72 y.o. Date of Encounter: 06/11/2017, 9:25 AM    Chief Complaint: Routine f/u OV  HPI: 72 y.o. y/o male here for    09/14/2016: He presents as a new patient to establish care. He had been seeing Delia Chimes but her practice is closed so he is here to establish.  His daughter who is 70 years old is here with him for visit. He speaks some [place] but she speaks more [place]. She helps to translate.  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  Daughter states that she is 74 years old and at home it is the 2 of them. Patient states that he also has another daughter who lives in [place] but she has her own family and is usually too busy to come with him to his appointments.  He has diabetes and hypertension. These are his only known medical problems.  I asked about eye exam. He states that he his last eye exam was 4 or 5 months ago. States that he sees someone in [place]. Says that they always call him when he is due for another visit.  He has no specific complaints or concerns to address today.    03/12/2017: At his visit 09/14/16 labs were obtained.  His cholesterol showed LDL 93.  Given that he had diabetes I have him start simvastatin 10 mg.  Did start that as directed and did come in for follow-up labs 10/31/16.  We will was down to 50. Other labs were good lab check 09/14/16. Today he reports that he continues to take the simvastatin daily.  This is causing no myalgias or other adverse effects. He is also taking his medication for diabetes as directed.  This is causing no adverse effects. Also taking blood pressure medication as directed.  This is causing no lightheadedness or other adverse effects. He also reports that he is taking a baby aspirin daily.  This is added to med list today. He has no specific concerns today. He has been feeling  fine. Notes that he has not been working as much during these winter months and plus be been having lots of rain.    06/11/2017: Today he mentions that in the past he has had h/o gout. Says that recently he has felt symptoms of gout--- some pain and swelling in toe/foot-- but says it resolved on its own within just 1 -2 days.  He has no other complaints, concerns today.  Continues to take all meds as directed with no adv effects. He continues to take the simvastatin daily.  This is causing no myalgias or other adverse effects. He is also taking his medication for diabetes as directed.  This is causing no adverse effects. Also taking blood pressure medication as directed.  This is causing no lightheadedness or other adverse effects. He also reports that he is taking a baby aspirin daily.      Review of Systems: Consitutional: No fever, chills, fatigue, night sweats, lymphadenopathy, or weight changes. Eyes: No visual changes, eye redness, or discharge. ENT/Mouth: Ears: No otalgia, tinnitus, hearing loss, discharge. Nose: No congestion, rhinorrhea, sinus pain, or epistaxis. Throat: No sore throat, post nasal drip, or teeth pain. Cardiovascular: No CP, palpitations, diaphoresis, DOE, edema, orthopnea, PND. Respiratory: No cough, hemoptysis, SOB, or wheezing. Gastrointestinal: No anorexia, dysphagia, reflux, pain, nausea, vomiting, hematemesis, diarrhea, constipation, BRBPR, or melena. Genitourinary: No dysuria,  frequency, urgency, hematuria, incontinence, nocturia, decreased urinary stream, discharge, impotence, or testicular pain/masses. Musculoskeletal: No decreased ROM, myalgias, stiffness, joint swelling, or weakness. Skin: No rash, erythema, lesion changes, pain, warmth, jaundice, or pruritis. Neurological: No headache, dizziness, syncope, seizures, tremors, memory loss, coordination problems, or paresthesias. Psychological: No anxiety, depression, hallucinations, SI/HI. Endocrine: Known  diabetes. All other systems were reviewed and are otherwise negative.  Past Medical History:  Diagnosis Date  . Diabetes mellitus without complication ([place])   . Hypertension      Past Surgical History:  Procedure Laterality Date  . CHOLECYSTECTOMY    . EYE SURGERY     2016 both eyes    Home Meds:  Outpatient Medications Prior to Visit  Medication Sig Dispense Refill  . aspirin EC 81 MG tablet Take 1 tablet (81 mg total) by mouth daily. 150 tablet 2  . clindamycin (CLEOCIN) 150 MG capsule Take 2 capsules (300 mg total) by mouth 3 (three) times daily. May dispense as 150mg  capsules 42 capsule 0  . lisinopril (PRINIVIL,ZESTRIL) 10 MG tablet Take 1 tablet (10 mg total) by mouth daily. 90 tablet 0  . metFORMIN (GLUCOPHAGE) 500 MG tablet Take 1 tablet (500 mg total) by mouth 2 (two) times daily. 180 tablet 0  . simvastatin (ZOCOR) 10 MG tablet Take 1 tablet (10 mg total) by mouth at bedtime. 90 tablet 1   No facility-administered medications prior to visit.     Allergies:  Allergies  Allergen Reactions  . Penicillins Other (See Comments)    Difficulty sleeping    Social History   Socioeconomic History  . Marital status: Married    Spouse name: Not on file  . Number of children: Not on file  . Years of education: Not on file  . Highest education level: Not on file  Social Needs  . Financial resource strain: Not on file  . Food insecurity - worry: Not on file  . Food insecurity - inability: Not on file  . Transportation needs - medical: Not on file  . Transportation needs - non-medical: Not on file  Occupational History  . Not on file  Tobacco Use  . Smoking status: Never Smoker  . Smokeless tobacco: Never Used  Substance and Sexual Activity  . Alcohol use: Yes    Comment: occasion  . Drug use: No  . Sexual activity: Not on file  Other Topics Concern  . Not on file  Social History Narrative  . Not on file    History reviewed. No pertinent family  history.  Physical Exam: Blood pressure 130/78, pulse 86, temperature 98 F (36.7 C), temperature source Oral, resp. rate 16, height 5\' 3"  (1.6 m), weight 109.2 kg (240 lb 12.8 oz), SpO2 96 %.  General: Obese Hispanic Male. Appears in no acute distress. Neck: Supple. Trachea midline. No thyromegaly. Full ROM. No lymphadenopathy. No carotid bruit. Lungs: Clear to auscultation bilaterally without wheezes, rales, or rhonchi. Breathing is of normal effort and unlabored. Cardiovascular: RRR with S1 S2. No murmurs, rubs, or gallops. No carotid or abdominal bruits. Abdomen: Soft, non-tender, non-distended with normoactive bowel sounds. No hepatosplenomegaly or masses. No rebound/guarding. No CVA tenderness. No hernias. Musculoskeletal: Full range of motion and 5/5 strength throughout.  Skin: Warm and moist without erythema, ecchymosis, wounds, or rash. Neuro: A+Ox3. CN II-XII grossly intact. Moves all extremities spontaneously. Full sensation throughout. Normal gait.  Diabetic foot exam: Inspection is normal. He has no wounds. No problem areas. He has no palpable dorsalis pedis or posterior  tibial pulses bilaterally. Psych:  Responds to questions appropriately with a normal affect.   Assessment/Plan:  72 y.o. y/o  male here for  1. Hyperlipidemia, unspecified hyperlipidemia type 06/11/2017: He is taking his simvastatin 10 mg daily.  He had lab 02/2017--LDL was 43---cont simva 10mg  QD   2. Essential hypertension 06/11/2017: Blood Pressure is at goal.  Continue current medication without change.  Check lab to monitor. - COMPLETE METABOLIC PANEL WITH GFR  3. Controlled type 2 diabetes mellitus without complication, without long-term current use of insulin ([place]) 06/11/2017:Check lab to monitor.  NOTE----IF CREATININE INCREASES--- WILL NEED TO ADJUST MEDS----ON METFORMIN---- - COMPLETE METABOLIC PANEL WITH GFR - Hemoglobin A1c  4. Idiopathic gout, unspecified chronicity, unspecified  site 06/11/2017: Check Uric Acid level - Uric acid - BASIC METABOLIC PANEL WITH GFR     For preventive care see his CPE note from 09/14/2016.   Plan for routine follow-up visit in 3 months.  Follow-up sooner if needed.  Signed:   [address] [place], [place]  06/11/2017 9:25 AM

## 2017-06-12 LAB — BASIC METABOLIC PANEL WITH GFR
BUN: 18 mg/dL (ref 7–25)
CO2: 27 mmol/L (ref 20–32)
CREATININE: 1.17 mg/dL (ref 0.70–1.18)
Calcium: 9.6 mg/dL (ref 8.6–10.3)
Chloride: 105 mmol/L (ref 98–110)
GFR, Est African American: 73 mL/min/{1.73_m2} (ref >=60)
GFR, Est Non African American: 63 mL/min/{1.73_m2} (ref >=60)
Glucose, Bld: 156 mg/dL — ABNORMAL HIGH (ref 65–99)
Potassium: 4.8 mmol/L (ref 3.5–5.3)
Sodium: 140 mmol/L (ref 135–146)

## 2017-06-12 LAB — HEMOGLOBIN A1C
Hgb A1c MFr Bld: 6.8 % of total Hgb — ABNORMAL HIGH (ref <5.7)
Mean Plasma Glucose: 148 (calc)
eAG (mmol/L): 8.2 (calc)

## 2017-06-12 LAB — URIC ACID: Uric Acid, Serum: 6.7 mg/dL (ref 4.0–8.0)

## 2017-06-15 ENCOUNTER — Other Ambulatory Visit: Payer: Self-pay

## 2017-06-15 MED ORDER — ALLOPURINOL 100 MG PO TABS: 100.0000 mg | ORAL_TABLET | Freq: Every day | ORAL | 5 refills | Status: DC

## 2017-06-18 ENCOUNTER — Other Ambulatory Visit: Payer: Self-pay

## 2017-06-18 MED ORDER — SIMVASTATIN 10 MG PO TABS: 10.0000 mg | ORAL_TABLET | Freq: Every day | ORAL | 1 refills | Status: DC

## 2017-06-18 MED ORDER — ALLOPURINOL 100 MG PO TABS: 100.0000 mg | ORAL_TABLET | Freq: Every day | ORAL | 1 refills | Status: DC

## 2017-06-18 MED ORDER — ASPIRIN EC 81 MG PO TBEC: 81.0000 mg | DELAYED_RELEASE_TABLET | Freq: Every day | ORAL | 1 refills | Status: AC

## 2017-06-18 MED ORDER — LISINOPRIL 10 MG PO TABS: 10.0000 mg | ORAL_TABLET | Freq: Every day | ORAL | 1 refills | Status: DC

## 2017-06-18 MED ORDER — METFORMIN HCL 500 MG PO TABS: 500.0000 mg | ORAL_TABLET | Freq: Two times a day (BID) | ORAL | 1 refills | Status: DC

## 2017-06-18 NOTE — Telephone Encounter (Signed)
Refill appropriate 

## 2017-12-12 ENCOUNTER — Encounter: Payer: Self-pay | Admitting: Physician Assistant

## 2017-12-12 ENCOUNTER — Ambulatory Visit: Payer: Medicare Other | Admitting: Physician Assistant

## 2017-12-12 VITALS — BP 132/86 | HR 79 | Temp 97.9°F | Resp 16 | Ht 63.0 in | Wt 237.8 lb

## 2017-12-12 DIAGNOSIS — M1000 Idiopathic gout, unspecified site: Secondary | ICD-10-CM | POA: Diagnosis not present

## 2017-12-12 DIAGNOSIS — I10 Essential (primary) hypertension: Secondary | ICD-10-CM

## 2017-12-12 DIAGNOSIS — R0600 Dyspnea, unspecified: Secondary | ICD-10-CM

## 2017-12-12 DIAGNOSIS — M109 Gout, unspecified: Secondary | ICD-10-CM | POA: Insufficient documentation

## 2017-12-12 DIAGNOSIS — E119 Type 2 diabetes mellitus without complications: Secondary | ICD-10-CM | POA: Diagnosis not present

## 2017-12-12 DIAGNOSIS — Z125 Encounter for screening for malignant neoplasm of prostate: Secondary | ICD-10-CM

## 2017-12-12 DIAGNOSIS — E785 Hyperlipidemia, unspecified: Secondary | ICD-10-CM | POA: Diagnosis not present

## 2017-12-12 MED ORDER — COLCHICINE 0.6 MG PO TABS: 0.6000 mg | ORAL_TABLET | Freq: Two times a day (BID) | ORAL | 2 refills | Status: DC

## 2017-12-12 NOTE — Progress Notes (Signed)
Patient ID: VIMAL DEREGO MRN: [id number], DOB: 1945-08-03 72 y.o. Date of Encounter: 12/12/2017, 8:16 AM    Chief Complaint: Routine f/u OV  HPI: 72 y.o. y/o male here for    09/14/2016: He presents as a new patient to establish care. He had been seeing Delia Chimes but her practice is closed so he is here to establish.  His daughter who is 14 years old is here with him for visit. He speaks some [place] but she speaks more [place]. She helps to translate.  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  Daughter states that she is 75 years old and at home it is the 2 of them. Patient states that he also has another daughter who lives in [place] but she has her own family and is usually too busy to come with him to his appointments.  He has diabetes and hypertension. These are his only known medical problems.  I asked about eye exam. He states that he his last eye exam was 4 or 5 months ago. States that he sees someone in [place]. Says that they always call him when he is due for another visit.  He has no specific complaints or concerns to address today.    03/12/2017: At his visit 09/14/16 labs were obtained.  His cholesterol showed LDL 93.  Given that he had diabetes I have him start simvastatin 10 mg.  Did start that as directed and did come in for follow-up labs 10/31/16.  We will was down to 50. Other labs were good lab check 09/14/16. Today he reports that he continues to take the simvastatin daily.  This is causing no myalgias or other adverse effects. He is also taking his medication for diabetes as directed.  This is causing no adverse effects. Also taking blood pressure medication as directed.  This is causing no lightheadedness or other adverse effects. He also reports that he is taking a baby aspirin daily.  This is added to med list today. He has no specific concerns today. He has been feeling  fine. Notes that he has not been working as much during these winter months and plus be been having lots of rain.    06/11/2017: Today he mentions that in the past he has had h/o gout. Says that recently he has felt symptoms of gout--- some pain and swelling in toe/foot-- but says it resolved on its own within just 1 -2 days.  He has no other complaints, concerns today.  Continues to take all meds as directed with no adv effects. He continues to take the simvastatin daily.  This is causing no myalgias or other adverse effects. He is also taking his medication for diabetes as directed.  This is causing no adverse effects. Also taking blood pressure medication as directed.  This is causing no lightheadedness or other adverse effects. He also reports that he is taking a baby aspirin daily.     12/12/2017: Today he reports that he "[place] in [place]."  Says that " while he was in [place] country he had gout in his right first toe and saw Dr. in his country that put an injection in that toe.  Says that got better but now feels like he has the gout up closer to his ankle." Usually his 21 year old daughter comes with him and helps to translate and interpret but today he is alone  for visit.  He does speak some English but is not fluent and does not understand my English real easily. Also states that some nights around 2 AM he will wake up feeling short of breath and feeling some heaviness on his chest. I asked if he was having any chest heaviness or other discomfort in his chest or shortness of breath with physical exertion when he is doing work on the farm but he states that he feels fine once he is up moving around then only has the symptoms at night. He reports that he did have some coffee with cream this morning otherwise is fasting. He is taking his statin daily.  No myalgias or other adverse effects. Is taking his diabetes medicines as directed with no adverse effects. He is  taking his BP meds as directed with no lightheadedness or other adverse effects. Continues to take baby aspirin daily with no bleeding.     Review of Systems: Consitutional: No fever, chills, fatigue, night sweats, lymphadenopathy, or weight changes. Eyes: No visual changes, eye redness, or discharge. ENT/Mouth: Ears: No otalgia, tinnitus, hearing loss, discharge. Nose: No congestion, rhinorrhea, sinus pain, or epistaxis. Throat: No sore throat, post nasal drip, or teeth pain. Cardiovascular: No CP, palpitations, diaphoresis, DOE, edema, orthopnea, PND. Respiratory: No cough, hemoptysis, SOB, or wheezing. Gastrointestinal: No anorexia, dysphagia, reflux, pain, nausea, vomiting, hematemesis, diarrhea, constipation, BRBPR, or melena. Genitourinary: No dysuria, frequency, urgency, hematuria, incontinence, nocturia, decreased urinary stream, discharge, impotence, or testicular pain/masses. Musculoskeletal: No decreased ROM, myalgias, stiffness, joint swelling, or weakness. Skin: No rash, erythema, lesion changes, pain, warmth, jaundice, or pruritis. Neurological: No headache, dizziness, syncope, seizures, tremors, memory loss, coordination problems, or paresthesias. Psychological: No anxiety, depression, hallucinations, SI/HI. Endocrine: Known diabetes. All other systems were reviewed and are otherwise negative.  Past Medical History:  Diagnosis Date  . Diabetes mellitus without complication ([place])   . Hypertension      Past Surgical History:  Procedure Laterality Date  . CHOLECYSTECTOMY    . EYE SURGERY     2016 both eyes    Home Meds:  Outpatient Medications Prior to Visit  Medication Sig Dispense Refill  . allopurinol (ZYLOPRIM) 100 MG tablet Take 1 tablet (100 mg total) by mouth daily. 90 tablet 1  . aspirin EC 81 MG tablet Take 1 tablet (81 mg total) by mouth daily. 90 tablet 1  . clindamycin (CLEOCIN) 150 MG capsule Take 2 capsules (300 mg total) by mouth 3 (three) times  daily. May dispense as 150mg  capsules 42 capsule 0  . lisinopril (PRINIVIL,ZESTRIL) 10 MG tablet Take 1 tablet (10 mg total) by mouth daily. 90 tablet 1  . metFORMIN (GLUCOPHAGE) 500 MG tablet Take 1 tablet (500 mg total) by mouth 2 (two) times daily. 180 tablet 1  . simvastatin (ZOCOR) 10 MG tablet Take 1 tablet (10 mg total) by mouth at bedtime. 90 tablet 1   No facility-administered medications prior to visit.     Allergies:  Allergies  Allergen Reactions  . Penicillins Other (See Comments)    Difficulty sleeping    Social History   Socioeconomic History  . Marital status: Married    Spouse name: Not on file  . Number of children: Not on file  . Years of education: Not on file  . Highest education level: Not on file  Occupational History  . Not on file  Social Needs  . Financial resource strain: Not on file  . Food insecurity:    Worry: Not on  file    Inability: Not on file  . Transportation needs:    Medical: Not on file    Non-medical: Not on file  Tobacco Use  . Smoking status: Never Smoker  . Smokeless tobacco: Never Used  Substance and Sexual Activity  . Alcohol use: Yes    Comment: occasion  . Drug use: No  . Sexual activity: Not on file  Lifestyle  . Physical activity:    Days per week: Not on file    Minutes per session: Not on file  . Stress: Not on file  Relationships  . Social connections:    Talks on phone: Not on file    Gets together: Not on file    Attends religious service: Not on file    Active member of club or organization: Not on file    Attends meetings of clubs or organizations: Not on file    Relationship status: Not on file  . Intimate partner violence:    Fear of current or ex partner: Not on file    Emotionally abused: Not on file    Physically abused: Not on file    Forced sexual activity: Not on file  Other Topics Concern  . Not on file  Social History Narrative  . Not on file    History reviewed. No pertinent family  history.  Physical Exam: Blood pressure 132/86, pulse 79, temperature 97.9 F (36.6 C), temperature source Oral, resp. rate 16, height 5\' 3"  (1.6 m), weight 107.9 kg, SpO2 98 %.  General: Obese Hispanic Male. Appears in no acute distress. Neck: Supple. Trachea midline. No thyromegaly. Full ROM. No lymphadenopathy. No carotid bruit. Lungs: Clear to auscultation bilaterally without wheezes, rales, or rhonchi. Breathing is of normal effort and unlabored. Cardiovascular: RRR with S1 S2. No murmurs, rubs, or gallops. No carotid or abdominal bruits. Abdomen: Soft, non-tender, non-distended with normoactive bowel sounds. No hepatosplenomegaly or masses. No rebound/guarding. No CVA tenderness. No hernias. Musculoskeletal: Full range of motion and 5/5 strength throughout.  Feet: He reports mild tenderness right foot just inferior.distal to medial malleolus. However, there is no warmth at this area.  No erythema.  Very minimal swelling. He then states that it was worse yesterday and is better today. Skin: Warm and moist without erythema, ecchymosis, wounds, or rash. Neuro: A+Ox3. CN II-XII grossly intact. Moves all extremities spontaneously. Full sensation throughout. Normal gait.  Diabetic foot exam: Inspection is normal. He has no wounds. No problem areas. He has no palpable dorsalis pedis or posterior tibial pulses bilaterally. Psych:  Responds to questions appropriately with a normal affect.   Assessment/Plan:  72 y.o. y/o  male here for   Paroxysmal nocturnal dyspnea 12/12/2017: Symptoms are concerning for paroxysmal nocturnal dyspnea.  Will refer to cardiology for ischemia evaluation.   If this is negative then may need sleep study. - Ambulatory referral to Cardiology   Controlled type 2 diabetes mellitus without complication, without long-term current use of insulin ([place]) 12/12/2017: Recheck A1c and microalbumin.  Monitor. - COMPLETE METABOLIC PANEL WITH GFR - Hemoglobin A1c -  Microalbumin, urine - Ambulatory referral to Cardiology   Hyperlipidemia, unspecified hyperlipidemia type 06/11/2017: He is taking his simvastatin 10 mg daily.  He had lab 02/2017--LDL was 43---cont simva 10mg  QD 12/12/2017: He did have some coffee with cream this morning.  Otherwise is fasting.  He is on simvastatin 10 mg.  Check FLP/LFT to monitor.   Essential hypertension 12/12/2017: Blood Pressure is at goal.  Continue current medication without  change.  Check lab to monitor. - COMPLETE METABOLIC PANEL WITH GFR   Idiopathic gout, unspecified chronicity, unspecified site 12/12/2017:  At lab 06/11/2017 showed uric acid 6.7.  At that time started allopurinol 100 mg daily. Today we will add colchicine for him to use as needed when has gout flare. Today we will also recheck uric acid level since adding allopurinol. - Uric acid - COMPLETE METABOLIC PANEL WITH GFR - colchicine 0.6 MG tablet; Take 1 tablet (0.6 mg total) by mouth 2 (two) times daily. As needed for gout flare.  Dispense: 60 tablet; Refill: 2   Prostate cancer screening 12/12/2017: Last PSA 08/2016.  Check annual PSA for prostate cancer screening. - PSA         For preventive care see his CPE note from 09/14/2016.   Plan for routine follow-up visit in 3 months.  Follow-up sooner if needed.  Signed:   [address] [place], [place]  12/12/2017 8:16 AM

## 2017-12-13 LAB — COMPLETE METABOLIC PANEL WITH GFR
AG Ratio: 1.9 (calc) (ref 1.0–2.5)
ALBUMIN MSPROF: 4.4 g/dL (ref 3.6–5.1)
ALT: 21 U/L (ref 9–46)
AST: 20 U/L (ref 10–35)
Alkaline phosphatase (APISO): 91 U/L (ref 40–115)
BUN / CREAT RATIO: 13 (calc) (ref 6–22)
BUN: 15 mg/dL (ref 7–25)
CHLORIDE: 104 mmol/L (ref 98–110)
CO2: 24 mmol/L (ref 20–32)
Calcium: 9.6 mg/dL (ref 8.6–10.3)
Creat: 1.19 mg/dL — ABNORMAL HIGH (ref 0.70–1.18)
GFR, EST AFRICAN AMERICAN: 71 mL/min/{1.73_m2} (ref >=60)
GFR, EST NON AFRICAN AMERICAN: 62 mL/min/{1.73_m2} (ref >=60)
GLUCOSE: 142 mg/dL — AB (ref 65–99)
Globulin: 2.3 g/dL (calc) (ref 1.9–3.7)
Potassium: 4.8 mmol/L (ref 3.5–5.3)
Sodium: 138 mmol/L (ref 135–146)
TOTAL PROTEIN: 6.7 g/dL (ref 6.1–8.1)
Total Bilirubin: 0.3 mg/dL (ref 0.2–1.2)

## 2017-12-13 LAB — MICROALBUMIN, URINE: Microalb, Ur: 0.3 mg/dL

## 2017-12-13 LAB — LIPID PANEL
CHOL/HDL RATIO: 2.5 (calc) (ref <5.0)
Cholesterol: 127 mg/dL (ref <200)
HDL: 51 mg/dL (ref >40)
LDL CHOLESTEROL (CALC): 56 mg/dL
Non-HDL Cholesterol (Calc): 76 mg/dL (calc) (ref <130)
Triglycerides: 116 mg/dL (ref <150)

## 2017-12-13 LAB — PSA: PSA: 3.4 ng/mL (ref <=4.0)

## 2017-12-13 LAB — HEMOGLOBIN A1C
Hgb A1c MFr Bld: 7.2 % of total Hgb — ABNORMAL HIGH (ref <5.7)
Mean Plasma Glucose: 160 (calc)
eAG (mmol/L): 8.9 (calc)

## 2017-12-13 LAB — URIC ACID: URIC ACID, SERUM: 6 mg/dL (ref 4.0–8.0)

## 2017-12-17 ENCOUNTER — Telehealth: Payer: Self-pay

## 2017-12-17 NOTE — Telephone Encounter (Signed)
Prior authorization started on colchicine 0.6mg  tablet

## 2017-12-18 MED ORDER — COLCHICINE 0.6 MG PO TABS: ORAL_TABLET | ORAL | 2 refills | Status: DC

## 2017-12-18 NOTE — Telephone Encounter (Signed)
Received  Voicemail  from Patrick Marshall with Patrick Marshall stating prior authorization for colchicine was a nonformulary drug and was denied because patien had not tried and failed alternatives. Per Patrick Marshall Patrick Marshall is covered with no prior authorization needed.

## 2017-12-19 DIAGNOSIS — E113293 Type 2 diabetes mellitus with mild nonproliferative diabetic retinopathy without macular edema, bilateral: Secondary | ICD-10-CM | POA: Diagnosis not present

## 2017-12-19 DIAGNOSIS — H04123 Dry eye syndrome of bilateral lacrimal glands: Secondary | ICD-10-CM | POA: Diagnosis not present

## 2017-12-19 DIAGNOSIS — H401132 Primary open-angle glaucoma, bilateral, moderate stage: Secondary | ICD-10-CM | POA: Diagnosis not present

## 2017-12-19 DIAGNOSIS — H2513 Age-related nuclear cataract, bilateral: Secondary | ICD-10-CM | POA: Diagnosis not present

## 2018-01-27 NOTE — Progress Notes (Signed)
Cardiology Office Note   Date:  01/28/2018   ID:  Patrick Marshall, DOB [birth year], MRN [id number]  PCP:  Orlena Sheldon, PA-C  Cardiologist:   No primary care provider on file. Referring:  Orlena Sheldon, PA-C  Chief Complaint  Patient presents with  . Shortness of Breath      History of Present Illness: Patrick Marshall is a 72 y.o. male who presents is referred by Orlena Sheldon, PA-C for evalaution of SOB.   This interview was obtained through an interpreter.  Patient is had no prior cardiac history.  He feels like his "heart he is tired.  He feels like there is something stuck in his throat.  He describes as choking sensation.  He says that all of this happens when he lies down at night.  He feels better if he is upright or even seated.  He does associated with eating meals but not with particular types of meals.  He thinks this has been going on for some time but getting slowly worse.  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  He denies any neck or arm discomfort.  He does not describe any palpitations, presyncope or syncope to me.  Sometimes at night when he is short of breath or get up and go to sleep in a recliner.  He is not describing any weight gain or edema.  He does not describe any prior cardiac testing.   Past Medical History:  Diagnosis Date  . Diabetes mellitus without complication ([place])    x few years.  . Hypertension     Past Surgical History:  Procedure Laterality Date  . CHOLECYSTECTOMY    . EYE SURGERY     2016 both eyes     Current Outpatient Medications  Medication Sig Dispense Refill  . allopurinol (ZYLOPRIM) 100 MG tablet Take 1 tablet (100 mg total) by mouth daily. 90 tablet 1  . aspirin EC 81 MG tablet Take 1 tablet (81 mg total) by mouth daily. 90 tablet 1  . clindamycin (CLEOCIN) 150 MG capsule Take 2 capsules (300 mg total) by mouth 3 (three) times daily. May dispense as 150mg   capsules 42 capsule 0  . colchicine (COLCRYS) 0.6 MG tablet Take 1 tablet by mouth 2(times) daily as needed for gout flare up 60 tablet 2  . lisinopril (PRINIVIL,ZESTRIL) 10 MG tablet Take 1 tablet (10 mg total) by mouth daily. 90 tablet 1  . metFORMIN (GLUCOPHAGE) 500 MG tablet Take 1 tablet (500 mg total) by mouth 2 (two) times daily. 180 tablet 1  . simvastatin (ZOCOR) 10 MG tablet Take 1 tablet (10 mg total) by mouth at bedtime. 90 tablet 1   No current facility-administered medications for this visit.     Allergies:   Penicillins    Social History:  The patient  reports that he has never smoked. He has never used smokeless tobacco. He reports that he drinks alcohol. He reports that he does not use drugs.   Family History:  The patient's family history includes Heart failure in his father.    ROS:  Please see the history of present illness.   Otherwise, review of systems are positive for decreased hearing.   All other systems are reviewed and negative.    PHYSICAL EXAM: VS:  BP 104/70 (BP Location: Right Arm, Patient Position: Sitting, Cuff Size: Large)   Pulse 74  Ht 5\' 3"  (1.6 m)   Wt 237 lb (107.5 kg)   BMI 41.98 kg/m  , BMI Body mass index is 41.98 kg/m. GENERAL:  Well appearing HEENT:  Pupils equal round and reactive, fundi not visualized, oral mucosa unremarkable NECK:  No jugular venous distention, waveform within normal limits, carotid upstroke brisk and symmetric, no bruits, no thyromegaly LYMPHATICS:  No cervical, inguinal adenopathy LUNGS:  Clear to auscultation bilaterally BACK:  No CVA tenderness CHEST:  Unremarkable HEART:  PMI not displaced or sustained,S1 and S2 within normal limits, no S3, no S4, no clicks, no rubs, no murmurs ABD:  Flat, positive bowel sounds normal in frequency in pitch, no bruits, no rebound, no guarding, no midline pulsatile mass, no hepatomegaly, no splenomegaly EXT:  2 plus pulses throughout, no edema, no cyanosis no clubbing SKIN:   No rashes no nodules NEURO:  Cranial nerves II through XII grossly intact, motor grossly intact throughout PSYCH:  Cognitively intact, oriented to person place and time    EKG:  EKG is ordered today. The ekg ordered today demonstrates sinus rhythm, rate 74, axis within normal limits, intervals within normal limits, no acute ST-T wave changes.   Recent Labs: 12/12/2017: ALT 21; BUN 15; Creat 1.19; Potassium 4.8; Sodium 138    Lipid Panel    Component Value Date/Time   CHOL 127 12/12/2017 0834   TRIG 116 12/12/2017 0834   HDL 51 12/12/2017 0834   CHOLHDL 2.5 12/12/2017 0834   VLDL 31 (H) 10/31/2016 0919   LDLCALC 56 12/12/2017 0834     Lab Results  Component Value Date   HGBA1C 7.2 (H) 12/12/2017    Wt Readings from Last 3 Encounters:  01/28/18 237 lb (107.5 kg)  12/12/17 237 lb 12.8 oz (107.9 kg)  06/11/17 240 lb 12.8 oz (109.2 kg)      Other studies Reviewed: Additional studies/ records that were reviewed today include: Office records and labs. Review of the above records demonstrates:  Please see elsewhere in the note.     ASSESSMENT AND PLAN:  SOB:   I suspect this is more GI related possibly reflux but he does have risk factors.  I would like him to do a stress test .  He will come back for a POET (Plain Old Exercise Treadmill).  I will check a BNP.   POSSIBLE SLEEP APNEA:    He is going to have this evaluated if the cardiac work up is negative.   DM:  A1C was as above.  He is having therapy per Dena Billet B, PA-C  DYSLIPIDEMIA:    Lipids are excellent.  No change in therapy  HTN:  The blood pressure is at target. No change in medications is indicated. We will continue with therapeutic lifestyle changes (TLC).  OBESITY:  I discussed this and he says that this is natural for his age and he doesn't weigh as much as some people.     Current medicines are reviewed at length with the patient today.  The patient does not have concerns regarding medicines.  The  following changes have been made:  no change  Labs/ tests ordered today include:   Orders Placed This Encounter  Procedures  . Pro b natriuretic peptide (BNP)[facility])  . EXERCISE TOLERANCE TEST (ETT)  . EKG 12-Lead     Disposition:   FU with as needed based on the results of the above.     Signed, Minus Breeding, MD  01/28/2018 10:53 AM  [facility]

## 2018-01-28 ENCOUNTER — Encounter: Payer: Self-pay | Admitting: Cardiology

## 2018-01-28 ENCOUNTER — Ambulatory Visit (INDEPENDENT_AMBULATORY_CARE_PROVIDER_SITE_OTHER): Payer: Medicare Other | Admitting: Cardiology

## 2018-01-28 VITALS — BP 104/70 | HR 74 | Ht 63.0 in | Wt 237.0 lb

## 2018-01-28 DIAGNOSIS — E119 Type 2 diabetes mellitus without complications: Secondary | ICD-10-CM

## 2018-01-28 DIAGNOSIS — E785 Hyperlipidemia, unspecified: Secondary | ICD-10-CM | POA: Diagnosis not present

## 2018-01-28 DIAGNOSIS — I10 Essential (primary) hypertension: Secondary | ICD-10-CM

## 2018-01-28 DIAGNOSIS — R0602 Shortness of breath: Secondary | ICD-10-CM | POA: Insufficient documentation

## 2018-01-28 LAB — PRO B NATRIURETIC PEPTIDE: NT-Pro BNP: 75 pg/mL (ref 0–376)

## 2018-01-28 NOTE — Patient Instructions (Signed)
Medication Instructions:  Continue current medications  If you need a refill on your cardiac medications before your next appointment, please call your pharmacy.  Labwork: BNP Today HERE IN OUR OFFICE AT [facility]  Take the provided lab slips with you to the lab for your blood draw.   You will NOT need to fast   If you have labs (blood work) drawn today and your tests are completely normal, you will receive your results only by: Marland Kitchen MyChart Message (if you have MyChart) OR . A paper copy in the mail If you have any lab test that is abnormal or we need to change your treatment, we will call you to review the results.  Testing/Procedures: Your physician has requested that you have an exercise tolerance test. For further information please visit [email or web address]. Please also follow instruction sheet, as given.  Follow-Up: . You will need a follow up appointment in As Needed.   At [facility], you and your health needs are our priority.  As part of our continuing mission to provide you with exceptional heart care, we have created designated Provider Care Teams.  These Care Teams include your primary Cardiologist (physician) and Advanced Practice Providers (APPs -  Physician Assistants and Nurse Practitioners) who all work together to provide you with the care you need, when you need it.   Thank you for choosing [facility] at [facility]!!

## 2018-01-31 ENCOUNTER — Telehealth (HOSPITAL_COMMUNITY): Payer: Self-pay

## 2018-01-31 NOTE — Telephone Encounter (Signed)
Encounter complete. 

## 2018-02-05 ENCOUNTER — Ambulatory Visit (HOSPITAL_COMMUNITY)
Admission: RE | Admit: 2018-02-05 | Discharge: 2018-02-05 | Disposition: A | Discharge status: Home / Self Care | Payer: Medicare Other | Source: Ambulatory Visit | Attending: Internal Medicine | Admitting: Internal Medicine

## 2018-02-05 DIAGNOSIS — R0602 Shortness of breath: Secondary | ICD-10-CM | POA: Insufficient documentation

## 2018-02-05 LAB — EXERCISE TOLERANCE TEST
CHL CUP RESTING HR STRESS: 74 {beats}/min
CSEPED: 6 min
CSEPEDS: 0 s
CSEPEW: 7 METS
MPHR: 149 {beats}/min
Peak HR: 129 {beats}/min
Percent HR: 86 %
RPE: 18

## 2018-02-14 ENCOUNTER — Encounter: Payer: Self-pay | Admitting: Family Medicine

## 2018-02-26 ENCOUNTER — Ambulatory Visit (INDEPENDENT_AMBULATORY_CARE_PROVIDER_SITE_OTHER): Payer: Medicare Other | Admitting: Family Medicine

## 2018-02-26 ENCOUNTER — Encounter: Payer: Self-pay | Admitting: Family Medicine

## 2018-02-26 VITALS — BP 110/74 | HR 88 | Temp 98.3°F | Resp 16 | Ht 63.0 in | Wt 239.4 lb

## 2018-02-26 DIAGNOSIS — E119 Type 2 diabetes mellitus without complications: Secondary | ICD-10-CM

## 2018-02-26 DIAGNOSIS — Z125 Encounter for screening for malignant neoplasm of prostate: Secondary | ICD-10-CM

## 2018-02-26 DIAGNOSIS — Z23 Encounter for immunization: Secondary | ICD-10-CM

## 2018-02-26 DIAGNOSIS — M1000 Idiopathic gout, unspecified site: Secondary | ICD-10-CM

## 2018-02-26 DIAGNOSIS — E785 Hyperlipidemia, unspecified: Secondary | ICD-10-CM | POA: Diagnosis not present

## 2018-02-26 DIAGNOSIS — I10 Essential (primary) hypertension: Secondary | ICD-10-CM

## 2018-02-26 DIAGNOSIS — Z0000 Encounter for general adult medical examination without abnormal findings: Secondary | ICD-10-CM | POA: Diagnosis not present

## 2018-02-26 DIAGNOSIS — R399 Unspecified symptoms and signs involving the genitourinary system: Secondary | ICD-10-CM

## 2018-02-26 DIAGNOSIS — Z01818 Encounter for other preprocedural examination: Secondary | ICD-10-CM | POA: Diagnosis not present

## 2018-02-26 DIAGNOSIS — Z1211 Encounter for screening for malignant neoplasm of colon: Secondary | ICD-10-CM

## 2018-02-26 DIAGNOSIS — Z6841 Body Mass Index (BMI) 40.0 and over, adult: Secondary | ICD-10-CM

## 2018-02-26 NOTE — Patient Instructions (Addendum)
Mr. Patrick Marshall , Thank you for taking time to come for your Medicare Wellness Visit. I appreciate your ongoing commitment to your health goals. Please review the following plan we discussed and let me know if I can assist you in the future.   These are the goals we discussed: Goals    . Weight (lb) < 200 lb (90.7 kg)       This is a list of the screening recommended for you and due dates:  Health Maintenance  Topic Date Due  . Complete foot exam   12/22/1956  . Eye exam for diabetics  12/22/1956  . Colon Cancer Screening  12/22/1996  . Pneumonia vaccines (2 of 2 - PCV13) 09/14/2017  . Flu Shot  10/25/2017  . Hemoglobin A1C  06/12/2018  . Tetanus Vaccine  09/15/2026  .  Hepatitis C: One time screening is recommended by Center for Disease Control  (CDC) for  adults born from [birth year] through 1964.   Completed   Colonoscopy referral ordered - they will contact you  Foot exam done today  PCV 13 and Flu done today  Return ASAP for FASTING LABS - nothing to eat or drink for 8 hours except water or black coffee with no cream, no sugar   Health Maintenance, Male A healthy lifestyle and preventive care is important for your health and wellness. Ask your health care provider about what schedule of regular examinations is right for you. What should I know about weight and diet? Eat a Healthy Diet  Eat plenty of vegetables, fruits, whole grains, low-fat dairy products, and lean protein.  Do not eat a lot of foods high in solid fats, added sugars, or salt.  Maintain a Healthy Weight Regular exercise can help you achieve or maintain a healthy weight. You should:  Do at least 150 minutes of exercise each week. The exercise should increase your heart rate and make you sweat (moderate-intensity exercise).  Do strength-training exercises at least twice a week.  Watch Your Levels of Cholesterol and Blood Lipids  Have your blood tested for lipids and cholesterol every 5 years starting at 71  years of age. If you are at high risk for heart disease, you should start having your blood tested when you are 71 years old. You may need to have your cholesterol levels checked more often if: ? Your lipid or cholesterol levels are high. ? You are older than 71 years of age. ? You are at high risk for heart disease.  What should I know about cancer screening? Many types of cancers can be detected early and may often be prevented. Lung Cancer  You should be screened every year for lung cancer if: ? You are a current smoker who has smoked for at least 30 years. ? You are a former smoker who has quit within the past 15 years.  Talk to your health care provider about your screening options, when you should start screening, and how often you should be screened.  Colorectal Cancer  Routine colorectal cancer screening usually begins at 71 years of age and should be repeated every 5-10 years until you are 71 years old. You may need to be screened more often if early forms of precancerous polyps or small growths are found. Your health care provider may recommend screening at an earlier age if you have risk factors for colon cancer.  Your health care provider may recommend using home test kits to check for hidden blood in the stool.  A  small camera at the end of a tube can be used to examine your colon (sigmoidoscopy or colonoscopy). This checks for the earliest forms of colorectal cancer.  Prostate and Testicular Cancer  Depending on your age and overall health, your health care provider may do certain tests to screen for prostate and testicular cancer.  Talk to your health care provider about any symptoms or concerns you have about testicular or prostate cancer.  Skin Cancer  Check your skin from head to toe regularly.  Tell your health care provider about any new moles or changes in moles, especially if: ? There is a change in a mole's size, shape, or color. ? You have a mole that is  larger than a pencil eraser.  Always use sunscreen. Apply sunscreen liberally and repeat throughout the day.  Protect yourself by wearing long sleeves, pants, a wide-brimmed hat, and sunglasses when outside.  What should I know about heart disease, diabetes, and high blood pressure?  If you are 81-14 years of age, have your blood pressure checked every 3-5 years. If you are 13 years of age or older, have your blood pressure checked every year. You should have your blood pressure measured twice-once when you are at a hospital or clinic, and once when you are not at a hospital or clinic. Record the average of the two measurements. To check your blood pressure when you are not at a hospital or clinic, you can use: ? An automated blood pressure machine at a pharmacy. ? A home blood pressure monitor.  Talk to your health care provider about your target blood pressure.  If you are between 71-19 years old, ask your health care provider if you should take aspirin to prevent heart disease.  Have regular diabetes screenings by checking your fasting blood sugar level. ? If you are at a normal weight and have a low risk for diabetes, have this test once every three years after the age of 34. ? If you are overweight and have a high risk for diabetes, consider being tested at a younger age or more often.  A one-time screening for abdominal aortic aneurysm (AAA) by ultrasound is recommended for men aged 48-75 years who are current or former smokers. What should I know about preventing infection? Hepatitis B If you have a higher risk for hepatitis B, you should be screened for this virus. Talk with your health care provider to find out if you are at risk for hepatitis B infection. Hepatitis C Blood testing is recommended for:  Everyone born from [birth year] through 1964.  Anyone with known risk factors for hepatitis C.  Sexually Transmitted Diseases (STDs)  You should be screened each year for STDs  including gonorrhea and chlamydia if: ? You are sexually active and are younger than 71 years of age. ? You are older than 71 years of age and your health care provider tells you that you are at risk for this type of infection. ? Your sexual activity has changed since you were last screened and you are at an increased risk for chlamydia or gonorrhea. Ask your health care provider if you are at risk.  Talk with your health care provider about whether you are at high risk of being infected with HIV. Your health care provider may recommend a prescription medicine to help prevent HIV infection.  What else can I do?  Schedule regular health, dental, and eye exams.  Stay current with your vaccines (immunizations).  Do not use any  tobacco products, such as cigarettes, chewing tobacco, and e-cigarettes. If you need help quitting, ask your health care provider.  Limit alcohol intake to no more than 2 drinks per day. One drink equals 12 ounces of beer, 5 ounces of wine, or 1 ounces of hard liquor.  Do not use street drugs.  Do not share needles.  Ask your health care provider for help if you need support or information about quitting drugs.  Tell your health care provider if you often feel depressed.  Tell your health care provider if you have ever been abused or do not feel safe at home. This information is not intended to replace advice given to you by your health care provider. Make sure you discuss any questions you have with your health care provider. Document Released: 09/09/2007 Document Revised: 11/10/2015 Document Reviewed: 12/15/2014 Elsevier Interactive Patient Education  [employer].

## 2018-02-26 NOTE — Progress Notes (Signed)
Subjective:   Patrick Marshall is a 72 y.o. male who presents for Medicare Annual/Subsequent preventive examination.  Has arrived alone, no paperwork done, not completed prior to eval, pt HOH, spanish speaking.  Has multiple acute complaints but does want to proceed with MWV, he was asked to return for new acute complaints.  He complains of chronic pain in feet and ankles, new pain and decreased ROM to right shoulder without injury.  He denies any joint swelling or redness.   He brings with him a form from the ophthalmologist to be completed so he can have cataract procedure done   Review of Systems:  Review of Systems  Constitutional: Negative.  Negative for activity change, appetite change, fatigue and unexpected weight change.  HENT: Negative.   Eyes: Negative.   Respiratory: Negative.  Negative for shortness of breath.   Cardiovascular: Negative.  Negative for chest pain, palpitations and leg swelling.  Gastrointestinal: Negative.  Negative for abdominal pain and blood in stool.  Endocrine: Negative.   Genitourinary: Negative.  Negative for decreased urine volume, difficulty urinating, testicular pain and urgency.  Skin: Negative.  Negative for color change and pallor.  Allergic/Immunologic: Negative.   Neurological: Negative.  Negative for syncope, weakness, light-headedness and numbness.  Psychiatric/Behavioral: Negative.  Negative for confusion, dysphoric mood, self-injury and suicidal ideas. The patient is not nervous/anxious.   All other systems reviewed and are negative.   Cardiac Risk Factors include: advanced age (>73men, >70 women);diabetes mellitus;dyslipidemia;male gender;sedentary lifestyle;obesity (BMI >30kg/m2);hypertension     Objective:    Vitals: BP 110/74   Pulse 88   Temp 98.3 F (36.8 C) (Oral)   Resp 16   Ht 5\' 3"  (1.6 m)   Wt 239 lb 6 oz (108.6 kg)   SpO2 96%   BMI 42.40 kg/m   Body mass index is 42.4 kg/m.   Physical Exam  Constitutional:  He appears well-developed and well-nourished. No distress.  Obese male, well appearing, very HOH  HENT:  Head: Normocephalic and atraumatic.  Right Ear: Tympanic membrane, external ear and ear canal normal. Decreased hearing is noted.  Left Ear: Tympanic membrane, external ear and ear canal normal. Decreased hearing is noted.  Nose: Nose normal.  Mouth/Throat: Oropharynx is clear and moist.  Eyes: Pupils are equal, round, and reactive to light. Conjunctivae, EOM and lids are normal. Right eye exhibits no discharge. Left eye exhibits no discharge.  Neck: Trachea normal and normal range of motion. Neck supple. No tracheal deviation present.  Cardiovascular: Normal rate, regular rhythm, normal heart sounds and intact distal pulses. Exam reveals no gallop and no friction rub.  No murmur heard. Pulmonary/Chest: Effort normal and breath sounds normal. No stridor. No respiratory distress. He has no wheezes. He has no rales.  Abdominal: Soft. Bowel sounds are normal. He exhibits no distension. There is no tenderness.  Musculoskeletal:       Right shoulder: He exhibits decreased range of motion. He exhibits no tenderness and no bony tenderness.  Neurological: He is alert. He exhibits normal muscle tone. Coordination normal.  Skin: Skin is warm and dry. No rash noted. He is not diaphoretic.  Psychiatric: He has a normal mood and affect. His behavior is normal.  Nursing note and vitals reviewed.  Diabetic Foot Exam completed: Diabetic Foot Exam - detailed  Date & Time: 02/26/2018 10:00 AM Diabetic Foot exam was performed with the following findings: Yes  Visual Foot Exam completed.: Yes  Is there a history of foot ulcer?: No  Is there a foot ulcer now?: No  Is there swelling?: No  Is there elevated skin temperature?: No  Is there abnormal foot shape?: No  Is there a claw toe deformity?: No  Are the toenails long?: No  Are the toenails thick?: No  Are the toenails ingrown?: No  Is the skin thin,  fragile, shiny and hairless?": No  Normal Range of Motion?: No  Is there foot or ankle muscle weakness?: No  Do you have pain in calf while walking?: No  Are the shoes appropriate in style and fit?: No  Can the patient see the bottom of their feet?: No  Pulse Foot Exam completed.: Yes  Right Posterior Tibialis: Present Left posterior Tibialis: Present  Right Dorsalis Pedis: Present      Sensory Foot Exam Completed.: Yes  Semmes-Weinstein Monofilament Test  "+" means "has sensation" and "-" means "no sensation"  R Foot Test Control: Pos L Foot Test Control: Pos  R Site 1-Great Toe: Pos L Site 1-Great Toe: Pos  R Site 4: Pos L Site 4: Pos  R site 5: Pos L Site 5: Pos  R Site 6: Pos L Site 6: Pos     Image components are not supported.  Image components are not supported. Image components are not supported.      Advanced Directives 02/26/2018 11/24/2016  Does Patient Have a Medical Advance Directive? No No  Would patient like information on creating a medical advance directive? No - Patient declined -    Tobacco Social History   Tobacco Use  Smoking Status Never Smoker  Smokeless Tobacco Never Used     Counseling given: Not Answered   Clinical Intake:  Pre-visit preparation completed: No  Pain : 0-10 Pain Score: 3  Pain Type: Acute pain Pain Location: Shoulder Pain Orientation: Right     BMI - recorded: 42.4 Nutritional Status: BMI > 30  Obese Diabetes: Yes CBG done?: No Did pt. bring in CBG monitor from home?: No     Interpreter Needed?: No(pt denies need for interpreter, does not have family member with his today like he usually does)  Information entered by :: [facility]  Past Medical History:  Diagnosis Date  . Diabetes mellitus without complication ([place])    x few years.  . Hypertension    Past Surgical History:  Procedure Laterality Date  . CHOLECYSTECTOMY    . EYE SURGERY     2016 both eyes   Family History  Problem Relation Age of Onset  .  Heart failure Father         "Heart Surgery"  died age 15   Social History   Socioeconomic History  . Marital status: Married    Spouse name: Not on file  . Number of children: Not on file  . Years of education: Not on file  . Highest education level: Not on file  Occupational History  . Occupation: Technical brewer  . Financial resource strain: Not on file  . Food insecurity:    Worry: Not on file    Inability: Not on file  . Transportation needs:    Medical: Not on file    Non-medical: Not on file  Tobacco Use  . Smoking status: Never Smoker  . Smokeless tobacco: Never Used  Substance and Sexual Activity  . Alcohol use: Yes    Comment: occasion  . Drug use: No  . Sexual activity: Not on file  Lifestyle  . Physical activity:  Days per week: Not on file    Minutes per session: Not on file  . Stress: Not on file  Relationships  . Social connections:    Talks on phone: Not on file    Gets together: Not on file    Attends religious service: Not on file    Active member of club or organization: Not on file    Attends meetings of clubs or organizations: Not on file    Relationship status: Not on file  Other Topics Concern  . Not on file  Social History Narrative  . Not on file    Outpatient Encounter Medications as of 02/26/2018  Medication Sig  . allopurinol (ZYLOPRIM) 100 MG tablet Take 1 tablet (100 mg total) by mouth daily.  Marland Kitchen aspirin EC 81 MG tablet Take 1 tablet (81 mg total) by mouth daily.  . colchicine (COLCRYS) 0.6 MG tablet Take 1 tablet by mouth 2(times) daily as needed for gout flare up  . metFORMIN (GLUCOPHAGE) 500 MG tablet Take 1 tablet (500 mg total) by mouth 2 (two) times daily.  . simvastatin (ZOCOR) 10 MG tablet Take 1 tablet (10 mg total) by mouth at bedtime.  . [DISCONTINUED] lisinopril (PRINIVIL,ZESTRIL) 10 MG tablet Take 1 tablet (10 mg total) by mouth daily.  . clindamycin (CLEOCIN) 150 MG capsule Take 2 capsules (300 mg total) by  mouth 3 (three) times daily. May dispense as 150mg  capsules (Patient not taking: Reported on 02/28/2018)   No facility-administered encounter medications on file as of 02/26/2018.     Activities of Daily Living In your present state of health, do you have any difficulty performing the following activities: 02/28/2018  Hearing? Y  Vision? Y  Difficulty concentrating or making decisions? N  Walking or climbing stairs? N  Dressing or bathing? N  Doing errands, shopping? N  Preparing Food and eating ? N  Using the Toilet? N  In the past six months, have you accidently leaked urine? N  Do you have problems with loss of bowel control? N  Managing your Medications? N  Managing your Finances? N  Housekeeping or managing your Housekeeping? N  Some recent data might be hidden    Patient Care Team: Rennis Golden as PCP - General (Physician Assistant)    Recent eval by Dr. Percival Spanish - Cardiology for SOB Ophtomology - Dr. Donell Sievert - surgery 03/14/18 for left cataract extraction with lens implant   Assessment:   This is a routine wellness examination for Andrews.  Exercise Activities and Dietary recommendations Current Exercise Habits: The patient does not participate in regular exercise at present, Exercise limited by: None identified  Goals    . Weight (lb) < 200 lb (90.7 kg)       Fall Risk Fall Risk  02/26/2018 12/12/2017 06/11/2017 03/12/2017 09/14/2016  Falls in the past year? 0 No No No No  Number falls in past yr: 0 - - - -  Injury with Fall? 0 - - - -   Is the patient's home free of loose throw rugs in walkways, pet beds, electrical cords, etc?   no      Grab bars in the bathroom? no      Handrails on the stairs?   no      Adequate lighting?   yes   Depression Screen PHQ 2/9 Scores 02/26/2018 12/12/2017 06/11/2017 03/12/2017  PHQ - 2 Score 0 0 0 0      Office Visit from 02/26/2018 in [place]  [facility]  AUDIT-C Score  1       Cognitive Function       6CIT Screen 02/26/2018  What Year? 0 points  What month? 0 points  What time? 0 points  Count back from 20 0 points  Months in reverse 0 points  Repeat phrase 2 points  Total Score 2    Immunization History  Administered Date(s) Administered  . Influenza, High Dose Seasonal PF 02/26/2018  . Influenza-Unspecified 03/12/2017  . Pneumococcal Conjugate-13 02/26/2018  . Pneumococcal Polysaccharide-23 09/14/2016  . Tdap 09/14/2016    Qualifies for Shingles Vaccine? Yes - pt unsure if done int he past  Screening Tests Health Maintenance  Topic Date Due  . COLONOSCOPY  12/22/1996  . HEMOGLOBIN A1C  06/12/2018  . OPHTHALMOLOGY EXAM  01/29/2019  . FOOT EXAM  02/27/2019  . TETANUS/TDAP  09/15/2026  . INFLUENZA VACCINE  Completed  . Hepatitis C Screening  Completed  . PNA vac Low Risk Adult  Completed   Cancer Screenings: Lung: Low Dose CT Chest recommended if Age 8-80 years, 30 pack-year currently smoking OR have quit w/in 15years. Patient does not qualify. Colorectal: ordered - referred to GI  Additional Screenings:  Hepatitis C Screening: previously completed      Plan:      Problem List Items Addressed This Visit      Cardiovascular and Mediastinum   Hypertension   Relevant Orders   COMPLETE METABOLIC PANEL WITH GFR     Other   Hyperlipidemia   Relevant Orders   COMPLETE METABOLIC PANEL WITH GFR   Lipid panel   Gout   Relevant Orders   COMPLETE METABOLIC PANEL WITH GFR   Uric Acid   Prostate cancer screening   Relevant Orders   PSA   Class 3 severe obesity with body mass index (BMI) of 40.0 to 44.9 in adult [facility])   Relevant Orders   COMPLETE METABOLIC PANEL WITH GFR   Lipid panel   Hemoglobin A1c   Lower urinary tract symptoms (LUTS)   Relevant Orders   PSA    Other Visit Diagnoses    Encounter for Medicare annual wellness exam    -  Primary   Preop general physical exam       forms completed for pt   Relevant Orders   COMPLETE METABOLIC PANEL  WITH GFR   Lipid panel   Hemoglobin A1c   Controlled type 2 diabetes mellitus without complication, without long-term current use of insulin (HCC)       Relevant Orders   COMPLETE METABOLIC PANEL WITH GFR   Lipid panel   Hemoglobin A1c   Screening for malignant neoplasm of colon       Relevant Orders   Ambulatory referral to Gastroenterology   Need for influenza vaccination       Relevant Orders   Flu vaccine HIGH DOSE PF (Fluzone High dose) (Completed)   Need for vaccination       Relevant Orders   Pneumococcal conjugate vaccine 13-valent (Completed)      Patient was instructed to return as soon as possible for fasting labs    I have personally reviewed and noted the following in the patient's chart:   . Medical and social history . Use of alcohol, tobacco or illicit drugs  . Current medications and supplements . Functional ability and status . Nutritional status . Physical activity . Advanced directives . List of other physicians . Hospitalizations, surgeries, and ER visits in previous 42  months . Vitals . Screenings to include cognitive, depression, and falls . Referrals and appointments  In addition, I have reviewed and discussed with patient certain preventive protocols, quality metrics, and best practice recommendations. A written personalized care plan for preventive services as well as general preventive health recommendations were provided to patient.     Delsa Grana, PA-C  02/26/2018

## 2018-02-27 ENCOUNTER — Other Ambulatory Visit: Payer: Self-pay | Admitting: Family Medicine

## 2018-02-27 MED ORDER — LISINOPRIL 10 MG PO TABS: 10.0000 mg | ORAL_TABLET | Freq: Every day | ORAL | 1 refills | Status: DC

## 2018-02-28 ENCOUNTER — Encounter: Payer: Self-pay | Admitting: Family Medicine

## 2018-02-28 DIAGNOSIS — R399 Unspecified symptoms and signs involving the genitourinary system: Secondary | ICD-10-CM | POA: Insufficient documentation

## 2018-02-28 DIAGNOSIS — H2513 Age-related nuclear cataract, bilateral: Secondary | ICD-10-CM | POA: Diagnosis not present

## 2018-02-28 DIAGNOSIS — Z6841 Body Mass Index (BMI) 40.0 and over, adult: Secondary | ICD-10-CM | POA: Insufficient documentation

## 2018-02-28 DIAGNOSIS — H2512 Age-related nuclear cataract, left eye: Secondary | ICD-10-CM | POA: Diagnosis not present

## 2018-03-04 ENCOUNTER — Other Ambulatory Visit: Payer: Medicare Other

## 2018-03-04 DIAGNOSIS — E785 Hyperlipidemia, unspecified: Secondary | ICD-10-CM

## 2018-03-04 DIAGNOSIS — Z6841 Body Mass Index (BMI) 40.0 and over, adult: Secondary | ICD-10-CM

## 2018-03-04 DIAGNOSIS — Z125 Encounter for screening for malignant neoplasm of prostate: Secondary | ICD-10-CM

## 2018-03-04 DIAGNOSIS — E119 Type 2 diabetes mellitus without complications: Secondary | ICD-10-CM

## 2018-03-04 DIAGNOSIS — R399 Unspecified symptoms and signs involving the genitourinary system: Secondary | ICD-10-CM

## 2018-03-04 DIAGNOSIS — I10 Essential (primary) hypertension: Secondary | ICD-10-CM | POA: Diagnosis not present

## 2018-03-04 DIAGNOSIS — M1000 Idiopathic gout, unspecified site: Secondary | ICD-10-CM

## 2018-03-04 DIAGNOSIS — Z01818 Encounter for other preprocedural examination: Secondary | ICD-10-CM

## 2018-03-05 LAB — COMPLETE METABOLIC PANEL WITH GFR
AG Ratio: 1.8 (calc) (ref 1.0–2.5)
ALBUMIN MSPROF: 4.2 g/dL (ref 3.6–5.1)
ALT: 22 U/L (ref 9–46)
AST: 19 U/L (ref 10–35)
Alkaline phosphatase (APISO): 64 U/L (ref 40–115)
BUN / CREAT RATIO: 16 (calc) (ref 6–22)
BUN: 19 mg/dL (ref 7–25)
CO2: 28 mmol/L (ref 20–32)
CREATININE: 1.22 mg/dL — AB (ref 0.70–1.18)
Calcium: 9.1 mg/dL (ref 8.6–10.3)
Chloride: 105 mmol/L (ref 98–110)
GFR, EST AFRICAN AMERICAN: 69 mL/min/{1.73_m2} (ref >=60)
GFR, Est Non African American: 59 mL/min/{1.73_m2} — ABNORMAL LOW (ref >=60)
GLUCOSE: 109 mg/dL — AB (ref 65–99)
Globulin: 2.3 g/dL (calc) (ref 1.9–3.7)
Potassium: 4.9 mmol/L (ref 3.5–5.3)
Sodium: 139 mmol/L (ref 135–146)
TOTAL PROTEIN: 6.5 g/dL (ref 6.1–8.1)
Total Bilirubin: 0.5 mg/dL (ref 0.2–1.2)

## 2018-03-05 LAB — PSA: PSA: 3.8 ng/mL (ref <=4.0)

## 2018-03-05 LAB — LIPID PANEL
CHOLESTEROL: 159 mg/dL (ref <200)
HDL: 48 mg/dL (ref >40)
LDL Cholesterol (Calc): 92 mg/dL (calc)
Non-HDL Cholesterol (Calc): 111 mg/dL (calc) (ref <130)
Total CHOL/HDL Ratio: 3.3 (calc) (ref <5.0)
Triglycerides: 97 mg/dL (ref <150)

## 2018-03-05 LAB — HEMOGLOBIN A1C
HEMOGLOBIN A1C: 6.9 %{Hb} — AB (ref <5.7)
Mean Plasma Glucose: 151 (calc)
eAG (mmol/L): 8.4 (calc)

## 2018-03-05 LAB — URIC ACID: Uric Acid, Serum: 7.6 mg/dL (ref 4.0–8.0)

## 2018-03-06 ENCOUNTER — Other Ambulatory Visit: Payer: Self-pay | Admitting: Family Medicine

## 2018-03-06 ENCOUNTER — Encounter: Payer: Self-pay | Admitting: Gastroenterology

## 2018-03-06 DIAGNOSIS — E119 Type 2 diabetes mellitus without complications: Secondary | ICD-10-CM

## 2018-03-06 DIAGNOSIS — E785 Hyperlipidemia, unspecified: Secondary | ICD-10-CM

## 2018-03-06 DIAGNOSIS — M1000 Idiopathic gout, unspecified site: Secondary | ICD-10-CM

## 2018-03-06 MED ORDER — METFORMIN HCL 500 MG PO TABS: 500.0000 mg | ORAL_TABLET | Freq: Two times a day (BID) | ORAL | 1 refills | Status: DC

## 2018-03-06 MED ORDER — ALLOPURINOL 100 MG PO TABS: 150.0000 mg | ORAL_TABLET | Freq: Every day | ORAL | 2 refills | Status: DC

## 2018-03-06 MED ORDER — SIMVASTATIN 10 MG PO TABS: 10.0000 mg | ORAL_TABLET | Freq: Every day | ORAL | 1 refills | Status: DC

## 2018-03-06 NOTE — Progress Notes (Signed)
meds refilled and 1 month labs put in  Otherwise he should return in 3-4 months for f/up visit on DM, HLD, HTN, gout.

## 2018-03-13 ENCOUNTER — Ambulatory Visit: Payer: Medicare Other | Admitting: Physician Assistant

## 2018-03-14 DIAGNOSIS — I10 Essential (primary) hypertension: Secondary | ICD-10-CM | POA: Diagnosis not present

## 2018-03-14 DIAGNOSIS — H25812 Combined forms of age-related cataract, left eye: Secondary | ICD-10-CM | POA: Diagnosis not present

## 2018-03-14 DIAGNOSIS — Z7984 Long term (current) use of oral hypoglycemic drugs: Secondary | ICD-10-CM | POA: Diagnosis not present

## 2018-03-14 DIAGNOSIS — H2512 Age-related nuclear cataract, left eye: Secondary | ICD-10-CM | POA: Diagnosis not present

## 2018-03-14 DIAGNOSIS — Z880 Allergy status to penicillin: Secondary | ICD-10-CM | POA: Diagnosis not present

## 2018-03-14 DIAGNOSIS — Z79899 Other long term (current) drug therapy: Secondary | ICD-10-CM | POA: Diagnosis not present

## 2018-03-14 DIAGNOSIS — E119 Type 2 diabetes mellitus without complications: Secondary | ICD-10-CM | POA: Diagnosis not present

## 2018-03-14 DIAGNOSIS — Z7982 Long term (current) use of aspirin: Secondary | ICD-10-CM | POA: Diagnosis not present

## 2018-03-14 DIAGNOSIS — Z87891 Personal history of nicotine dependence: Secondary | ICD-10-CM | POA: Diagnosis not present

## 2018-03-14 DIAGNOSIS — Z6841 Body Mass Index (BMI) 40.0 and over, adult: Secondary | ICD-10-CM | POA: Diagnosis not present

## 2018-03-14 DIAGNOSIS — E669 Obesity, unspecified: Secondary | ICD-10-CM | POA: Diagnosis not present

## 2018-04-03 ENCOUNTER — Telehealth: Payer: Self-pay | Admitting: *Deleted

## 2018-04-03 NOTE — Telephone Encounter (Signed)
Patient no showed for Previsit. Called the patient and left message to call back and reschedule. Patient informed if no call back by 5 pm today, will cancel colonoscopy scheduled for 04/17/18.information given to Spanish speaking scheduler, Georgette Shell who will also call and attempt to reschedule.

## 2018-04-17 ENCOUNTER — Encounter: Payer: Medicare Other | Admitting: Gastroenterology

## 2018-05-03 DIAGNOSIS — Z961 Presence of intraocular lens: Secondary | ICD-10-CM | POA: Diagnosis not present

## 2018-05-03 DIAGNOSIS — T8529XA Other mechanical complication of intraocular lens, initial encounter: Secondary | ICD-10-CM | POA: Diagnosis not present

## 2018-05-06 ENCOUNTER — Encounter: Payer: Self-pay | Admitting: Family Medicine

## 2018-08-12 DIAGNOSIS — H2511 Age-related nuclear cataract, right eye: Secondary | ICD-10-CM | POA: Diagnosis not present

## 2018-08-12 DIAGNOSIS — H04123 Dry eye syndrome of bilateral lacrimal glands: Secondary | ICD-10-CM | POA: Diagnosis not present

## 2018-08-12 DIAGNOSIS — E113293 Type 2 diabetes mellitus with mild nonproliferative diabetic retinopathy without macular edema, bilateral: Secondary | ICD-10-CM | POA: Diagnosis not present

## 2018-08-12 DIAGNOSIS — H401132 Primary open-angle glaucoma, bilateral, moderate stage: Secondary | ICD-10-CM | POA: Diagnosis not present

## 2018-10-08 DIAGNOSIS — Z961 Presence of intraocular lens: Secondary | ICD-10-CM | POA: Diagnosis not present

## 2018-10-08 DIAGNOSIS — H20013 Primary iridocyclitis, bilateral: Secondary | ICD-10-CM | POA: Diagnosis not present

## 2018-10-08 DIAGNOSIS — H11153 Pinguecula, bilateral: Secondary | ICD-10-CM | POA: Diagnosis not present

## 2018-11-08 DIAGNOSIS — M5431 Sciatica, right side: Secondary | ICD-10-CM | POA: Diagnosis not present

## 2018-11-08 DIAGNOSIS — M955 Acquired deformity of pelvis: Secondary | ICD-10-CM | POA: Diagnosis not present

## 2018-11-08 DIAGNOSIS — M9903 Segmental and somatic dysfunction of lumbar region: Secondary | ICD-10-CM | POA: Diagnosis not present

## 2018-11-08 DIAGNOSIS — M9905 Segmental and somatic dysfunction of pelvic region: Secondary | ICD-10-CM | POA: Diagnosis not present

## 2018-11-11 DIAGNOSIS — M955 Acquired deformity of pelvis: Secondary | ICD-10-CM | POA: Diagnosis not present

## 2018-11-11 DIAGNOSIS — H2511 Age-related nuclear cataract, right eye: Secondary | ICD-10-CM | POA: Diagnosis not present

## 2018-11-11 DIAGNOSIS — M5431 Sciatica, right side: Secondary | ICD-10-CM | POA: Diagnosis not present

## 2018-11-11 DIAGNOSIS — M9903 Segmental and somatic dysfunction of lumbar region: Secondary | ICD-10-CM | POA: Diagnosis not present

## 2018-11-11 DIAGNOSIS — H04123 Dry eye syndrome of bilateral lacrimal glands: Secondary | ICD-10-CM | POA: Diagnosis not present

## 2018-11-11 DIAGNOSIS — M9905 Segmental and somatic dysfunction of pelvic region: Secondary | ICD-10-CM | POA: Diagnosis not present

## 2018-11-11 DIAGNOSIS — H401132 Primary open-angle glaucoma, bilateral, moderate stage: Secondary | ICD-10-CM | POA: Diagnosis not present

## 2018-11-11 DIAGNOSIS — Z961 Presence of intraocular lens: Secondary | ICD-10-CM | POA: Diagnosis not present

## 2018-11-13 DIAGNOSIS — M9905 Segmental and somatic dysfunction of pelvic region: Secondary | ICD-10-CM | POA: Diagnosis not present

## 2018-11-13 DIAGNOSIS — M5431 Sciatica, right side: Secondary | ICD-10-CM | POA: Diagnosis not present

## 2018-11-13 DIAGNOSIS — M955 Acquired deformity of pelvis: Secondary | ICD-10-CM | POA: Diagnosis not present

## 2018-11-13 DIAGNOSIS — M9903 Segmental and somatic dysfunction of lumbar region: Secondary | ICD-10-CM | POA: Diagnosis not present

## 2018-11-16 DIAGNOSIS — M9905 Segmental and somatic dysfunction of pelvic region: Secondary | ICD-10-CM | POA: Diagnosis not present

## 2018-11-16 DIAGNOSIS — M5431 Sciatica, right side: Secondary | ICD-10-CM | POA: Diagnosis not present

## 2018-11-16 DIAGNOSIS — M9903 Segmental and somatic dysfunction of lumbar region: Secondary | ICD-10-CM | POA: Diagnosis not present

## 2018-11-16 DIAGNOSIS — M955 Acquired deformity of pelvis: Secondary | ICD-10-CM | POA: Diagnosis not present

## 2018-11-18 DIAGNOSIS — M9903 Segmental and somatic dysfunction of lumbar region: Secondary | ICD-10-CM | POA: Diagnosis not present

## 2018-11-18 DIAGNOSIS — M5431 Sciatica, right side: Secondary | ICD-10-CM | POA: Diagnosis not present

## 2018-11-18 DIAGNOSIS — M955 Acquired deformity of pelvis: Secondary | ICD-10-CM | POA: Diagnosis not present

## 2018-11-18 DIAGNOSIS — M9905 Segmental and somatic dysfunction of pelvic region: Secondary | ICD-10-CM | POA: Diagnosis not present

## 2018-11-20 DIAGNOSIS — M9905 Segmental and somatic dysfunction of pelvic region: Secondary | ICD-10-CM | POA: Diagnosis not present

## 2018-11-20 DIAGNOSIS — M9903 Segmental and somatic dysfunction of lumbar region: Secondary | ICD-10-CM | POA: Diagnosis not present

## 2018-11-20 DIAGNOSIS — M955 Acquired deformity of pelvis: Secondary | ICD-10-CM | POA: Diagnosis not present

## 2018-11-20 DIAGNOSIS — M5431 Sciatica, right side: Secondary | ICD-10-CM | POA: Diagnosis not present

## 2018-11-22 DIAGNOSIS — M9905 Segmental and somatic dysfunction of pelvic region: Secondary | ICD-10-CM | POA: Diagnosis not present

## 2018-11-22 DIAGNOSIS — M5431 Sciatica, right side: Secondary | ICD-10-CM | POA: Diagnosis not present

## 2018-11-22 DIAGNOSIS — M955 Acquired deformity of pelvis: Secondary | ICD-10-CM | POA: Diagnosis not present

## 2018-11-22 DIAGNOSIS — M9903 Segmental and somatic dysfunction of lumbar region: Secondary | ICD-10-CM | POA: Diagnosis not present

## 2018-11-25 DIAGNOSIS — M955 Acquired deformity of pelvis: Secondary | ICD-10-CM | POA: Diagnosis not present

## 2018-11-25 DIAGNOSIS — M9903 Segmental and somatic dysfunction of lumbar region: Secondary | ICD-10-CM | POA: Diagnosis not present

## 2018-11-25 DIAGNOSIS — M5431 Sciatica, right side: Secondary | ICD-10-CM | POA: Diagnosis not present

## 2018-11-25 DIAGNOSIS — M9905 Segmental and somatic dysfunction of pelvic region: Secondary | ICD-10-CM | POA: Diagnosis not present

## 2018-11-27 DIAGNOSIS — M955 Acquired deformity of pelvis: Secondary | ICD-10-CM | POA: Diagnosis not present

## 2018-11-27 DIAGNOSIS — M9903 Segmental and somatic dysfunction of lumbar region: Secondary | ICD-10-CM | POA: Diagnosis not present

## 2018-11-27 DIAGNOSIS — M5431 Sciatica, right side: Secondary | ICD-10-CM | POA: Diagnosis not present

## 2018-11-27 DIAGNOSIS — M9905 Segmental and somatic dysfunction of pelvic region: Secondary | ICD-10-CM | POA: Diagnosis not present

## 2018-11-28 DIAGNOSIS — M5431 Sciatica, right side: Secondary | ICD-10-CM | POA: Diagnosis not present

## 2018-11-28 DIAGNOSIS — M955 Acquired deformity of pelvis: Secondary | ICD-10-CM | POA: Diagnosis not present

## 2018-11-28 DIAGNOSIS — M9903 Segmental and somatic dysfunction of lumbar region: Secondary | ICD-10-CM | POA: Diagnosis not present

## 2018-11-28 DIAGNOSIS — M9905 Segmental and somatic dysfunction of pelvic region: Secondary | ICD-10-CM | POA: Diagnosis not present

## 2018-12-12 ENCOUNTER — Telehealth: Payer: Self-pay | Admitting: Physician Assistant

## 2018-12-12 ENCOUNTER — Other Ambulatory Visit: Payer: Self-pay | Admitting: Family Medicine

## 2018-12-12 DIAGNOSIS — E119 Type 2 diabetes mellitus without complications: Secondary | ICD-10-CM

## 2018-12-12 MED ORDER — METFORMIN HCL 500 MG PO TABS: 500.0000 mg | ORAL_TABLET | Freq: Two times a day (BID) | ORAL | 1 refills | Status: DC

## 2018-12-12 MED ORDER — LISINOPRIL 10 MG PO TABS: 10.0000 mg | ORAL_TABLET | Freq: Every day | ORAL | 1 refills | Status: DC

## 2018-12-12 NOTE — Telephone Encounter (Signed)
Medication called/sent to requested pharmacy  

## 2018-12-12 NOTE — Telephone Encounter (Signed)
Patient needs refills on lisinopril and metformin  [facility]

## 2018-12-16 ENCOUNTER — Other Ambulatory Visit: Payer: Self-pay

## 2018-12-16 ENCOUNTER — Encounter: Payer: Self-pay | Admitting: Family Medicine

## 2018-12-16 ENCOUNTER — Ambulatory Visit (INDEPENDENT_AMBULATORY_CARE_PROVIDER_SITE_OTHER): Payer: Medicare Other | Admitting: Family Medicine

## 2018-12-16 VITALS — BP 132/80 | HR 83 | Temp 98.4°F | Resp 16 | Ht 63.0 in | Wt 237.0 lb

## 2018-12-16 DIAGNOSIS — M25561 Pain in right knee: Secondary | ICD-10-CM

## 2018-12-16 DIAGNOSIS — M722 Plantar fascial fibromatosis: Secondary | ICD-10-CM

## 2018-12-16 MED ORDER — DICLOFENAC SODIUM 75 MG PO TBEC: 75.0000 mg | DELAYED_RELEASE_TABLET | Freq: Two times a day (BID) | ORAL | 0 refills | Status: DC

## 2018-12-16 NOTE — Progress Notes (Signed)
Subjective:    Patient ID: Patrick Marshall, male    DOB: 1945-08-08, 73 y.o.   MRN: [id number]  HPI Patient presents today with pain in his right knee and right heel.  He states that several weeks ago he twisted his right knee and felt pain suddenly in his right knee.  He now reports pain over the medial and lateral joint lines although this is been gradually getting better.  He denies any laxity to varus or valgus stress.  He denies any locking or catching in the knee but he does report pain with ambulation.  He has a positive Apley grind test today.  He has a negative anterior and posterior drawer sign.  He does have a small effusion.  However his worst pain is in his right heel.  He reports a stabbing pain in his right heel on the plantar surface of the calcaneus near the insertion of the plantar fascia.  He states it feels like someone is stabbing him with a knife every time he gets out of bed in the morning or after sitting a prolonged period of time.  He denies any injuries to his feet. Past Medical History:  Diagnosis Date  . Diabetes mellitus without complication ([place])    x few years.  . Hypertension    Past Surgical History:  Procedure Laterality Date  . CHOLECYSTECTOMY    . EYE SURGERY     2016 both eyes   Current Outpatient Medications on File Prior to Visit  Medication Sig Dispense Refill  . lisinopril (ZESTRIL) 10 MG tablet Take 1 tablet (10 mg total) by mouth daily. 90 tablet 1  . metFORMIN (GLUCOPHAGE) 500 MG tablet Take 1 tablet (500 mg total) by mouth 2 (two) times daily. 180 tablet 1  . simvastatin (ZOCOR) 10 MG tablet Take 1 tablet (10 mg total) by mouth at bedtime. 90 tablet 1  . allopurinol (ZYLOPRIM) 100 MG tablet Take 1.5 tablets (150 mg total) by mouth daily. (Patient not taking: Reported on 12/16/2018) 180 tablet 2  . colchicine (COLCRYS) 0.6 MG tablet Take 1 tablet by mouth 2(times) daily as needed for gout flare up (Patient not taking: Reported on 12/16/2018) 60  tablet 2   No current facility-administered medications on file prior to visit.    Allergies  Allergen Reactions  . Penicillins Other (See Comments)    Difficulty sleeping   Social History   Socioeconomic History  . Marital status: Married    Spouse name: Not on file  . Number of children: Not on file  . Years of education: Not on file  . Highest education level: Not on file  Occupational History  . Occupation: Technical brewer  . Financial resource strain: Not on file  . Food insecurity    Worry: Not on file    Inability: Not on file  . Transportation needs    Medical: Not on file    Non-medical: Not on file  Tobacco Use  . Smoking status: Never Smoker  . Smokeless tobacco: Never Used  Substance and Sexual Activity  . Alcohol use: Yes    Comment: occasion  . Drug use: No  . Sexual activity: Not on file  Lifestyle  . Physical activity    Days per week: Not on file    Minutes per session: Not on file  . Stress: Not on file  Relationships  . Social Herbalist on phone: Not on file    Gets  together: Not on file    Attends religious service: Not on file    Active member of club or organization: Not on file    Attends meetings of clubs or organizations: Not on file    Relationship status: Not on file  . Intimate partner violence    Fear of current or ex partner: Not on file    Emotionally abused: Not on file    Physically abused: Not on file    Forced sexual activity: Not on file  Other Topics Concern  . Not on file  Social History Narrative  . Not on file      Review of Systems  All other systems reviewed and are negative.      Objective:   Physical Exam Vitals signs reviewed.  Cardiovascular:     Rate and Rhythm: Normal rate and regular rhythm.  Pulmonary:     Effort: Pulmonary effort is normal.     Breath sounds: Normal breath sounds.  Musculoskeletal:     Right knee: He exhibits swelling, effusion and abnormal meniscus. He  exhibits normal range of motion. Tenderness found. Medial joint line and lateral joint line tenderness noted.     Right foot: Normal range of motion. Tenderness and bony tenderness present. No swelling or deformity.       Feet:           Assessment & Plan:  Plantar fasciitis, right  Acute pain of right knee  Patient has plantar fasciitis in the right foot.  Using sterile technique, I injected the point of maximum tenderness with 1 mL of 0.1% lidocaine without epinephrine and a 1 mL of 40 mg/mL Kenalog.  Patient tolerated the procedure well without complication.  I believe his knee pain is likely either arthritis or possibly a meniscal tear.  I recommended trying diclofenac 75 mg twice daily for 1 to 2 weeks.  If pain is no better I would recommend an x-ray and possibly a cortisone injection in the right knee.  Patient will notify me if symptoms do not improve

## 2018-12-31 ENCOUNTER — Other Ambulatory Visit: Payer: Self-pay

## 2018-12-31 ENCOUNTER — Encounter: Payer: Self-pay | Admitting: Family Medicine

## 2018-12-31 ENCOUNTER — Ambulatory Visit (INDEPENDENT_AMBULATORY_CARE_PROVIDER_SITE_OTHER): Payer: Medicare Other | Admitting: Family Medicine

## 2018-12-31 VITALS — BP 158/94 | HR 98 | Temp 97.2°F | Resp 16 | Ht 63.0 in | Wt 235.0 lb

## 2018-12-31 DIAGNOSIS — M25561 Pain in right knee: Secondary | ICD-10-CM | POA: Diagnosis not present

## 2018-12-31 DIAGNOSIS — M722 Plantar fascial fibromatosis: Secondary | ICD-10-CM

## 2018-12-31 DIAGNOSIS — E118 Type 2 diabetes mellitus with unspecified complications: Secondary | ICD-10-CM

## 2018-12-31 MED ORDER — DICLOFENAC SODIUM 1 % TD GEL: 2.0000 g | Freq: Four times a day (QID) | TRANSDERMAL | 2 refills | Status: DC

## 2018-12-31 MED ORDER — DICLOFENAC SODIUM 75 MG PO TBEC: 75.0000 mg | DELAYED_RELEASE_TABLET | Freq: Two times a day (BID) | ORAL | 0 refills | Status: DC

## 2018-12-31 NOTE — Progress Notes (Signed)
Subjective:    Patient ID: Patrick Marshall, male    DOB: 11-27-1945, 73 y.o.   MRN: [id number]  HPI  12/16/18 Patient presents today with pain in his right knee and right heel.  He states that several weeks ago he twisted his right knee and felt pain suddenly in his right knee.  He now reports pain over the medial and lateral joint lines although this is been gradually getting better.  He denies any laxity to varus or valgus stress.  He denies any locking or catching in the knee but he does report pain with ambulation.  He has a positive Apley grind test today.  He has a negative anterior and posterior drawer sign.  He does have a small effusion.  However his worst pain is in his right heel.  He reports a stabbing pain in his right heel on the plantar surface of the calcaneus near the insertion of the plantar fascia.  He states it feels like someone is stabbing him with a knife every time he gets out of bed in the morning or after sitting a prolonged period of time.  He denies any injuries to his feet.  At that time, my plan was: Patient has plantar fasciitis in the right foot.  Using sterile technique, I injected the point of maximum tenderness with 1 mL of 0.1% lidocaine without epinephrine and a 1 mL of 40 mg/mL Kenalog.  Patient tolerated the procedure well without complication.  I believe his knee pain is likely either arthritis or possibly a meniscal tear.  I recommended trying diclofenac 75 mg twice daily for 1 to 2 weeks.  If pain is no better I would recommend an x-ray and possibly a cortisone injection in the right knee.  Patient will notify me if symptoms do not improve  12/31/18 Patient returns today stating that the cortisone injection in his right heel helped for approximately a week and a half.  The pain was still there however it was better.  However now that injection has worn off and the pain has returned in his right heel.  The diclofenac did not help the pain either.  Regarding his  right knee, he states that the pain has improved dramatically in his right knee.  He continues to have some mild pain over the medial and lateral joint line but he states that the diclofenac helped substantially.  He describes the pain at worst is a 3 on a scale of 1-10.  Some days is better than that.  He declines a cortisone injection in the knee today as he feels that he does not require it.  However he mainly wants the pain in his heel to improve.  Unfortunately the patient did not see benefit with a cortisone injection.  Pain still suggest plantar fasciitis.  Patient's blood pressure today is elevated.  He has not had lab work pertaining to his diabetes since December of last year.  I have respectfully requested that we do this today.  On his last lab work there was mild chronic kidney disease which would make repeated use of NSAIDs unsafe.  I explained to the patient through an interpreter that he should not take the medication daily as this would likely lead to an ulcer.  Also explained that the medication can irritate and exacerbate chronic kidney disease. Past Medical History:  Diagnosis Date  . Diabetes mellitus without complication ([place])    x few years.  . Hypertension    Past Surgical  History:  Procedure Laterality Date  . CHOLECYSTECTOMY    . EYE SURGERY     2016 both eyes   Current Outpatient Medications on File Prior to Visit  Medication Sig Dispense Refill  . diclofenac (VOLTAREN) 75 MG EC tablet Take 1 tablet (75 mg total) by mouth 2 (two) times daily. 30 tablet 0  . lisinopril (ZESTRIL) 10 MG tablet Take 1 tablet (10 mg total) by mouth daily. 90 tablet 1  . metFORMIN (GLUCOPHAGE) 500 MG tablet Take 1 tablet (500 mg total) by mouth 2 (two) times daily. 180 tablet 1  . allopurinol (ZYLOPRIM) 100 MG tablet Take 1.5 tablets (150 mg total) by mouth daily. (Patient not taking: Reported on 12/16/2018) 180 tablet 2  . colchicine (COLCRYS) 0.6 MG tablet Take 1 tablet by mouth 2(times)  daily as needed for gout flare up (Patient not taking: Reported on 12/16/2018) 60 tablet 2  . simvastatin (ZOCOR) 10 MG tablet Take 1 tablet (10 mg total) by mouth at bedtime. (Patient not taking: Reported on 12/31/2018) 90 tablet 1   No current facility-administered medications on file prior to visit.    Allergies  Allergen Reactions  . Penicillins Other (See Comments)    Difficulty sleeping   Social History   Socioeconomic History  . Marital status: Married    Spouse name: Not on file  . Number of children: Not on file  . Years of education: Not on file  . Highest education level: Not on file  Occupational History  . Occupation: Technical brewer  . Financial resource strain: Not on file  . Food insecurity    Worry: Not on file    Inability: Not on file  . Transportation needs    Medical: Not on file    Non-medical: Not on file  Tobacco Use  . Smoking status: Never Smoker  . Smokeless tobacco: Never Used  Substance and Sexual Activity  . Alcohol use: Yes    Comment: occasion  . Drug use: No  . Sexual activity: Not on file  Lifestyle  . Physical activity    Days per week: Not on file    Minutes per session: Not on file  . Stress: Not on file  Relationships  . Social Herbalist on phone: Not on file    Gets together: Not on file    Attends religious service: Not on file    Active member of club or organization: Not on file    Attends meetings of clubs or organizations: Not on file    Relationship status: Not on file  . Intimate partner violence    Fear of current or ex partner: Not on file    Emotionally abused: Not on file    Physically abused: Not on file    Forced sexual activity: Not on file  Other Topics Concern  . Not on file  Social History Narrative  . Not on file      Review of Systems  All other systems reviewed and are negative.      Objective:   Physical Exam Vitals signs reviewed.  Cardiovascular:     Rate and Rhythm:  Normal rate and regular rhythm.  Pulmonary:     Effort: Pulmonary effort is normal.     Breath sounds: Normal breath sounds.  Musculoskeletal:     Right knee: He exhibits swelling, effusion and abnormal meniscus. He exhibits normal range of motion. Tenderness found. Medial joint line and lateral joint line  tenderness noted.     Right foot: Normal range of motion. Tenderness and bony tenderness present. No swelling or deformity.       Feet:           Assessment & Plan:  Controlled type 2 diabetes mellitus with complication, without long-term current use of insulin (HCC) - Plan: Hemoglobin A1c, COMPLETE METABOLIC PANEL WITH GFR  Plantar fasciitis, right  Acute pain of right knee  Patient declines lab work today.  I want him to return at his earliest convenience so that we can get a CMP and hemoglobin A1c.  Goal hemoglobin A1c is less than 7.  Furthermore I want to understand what his renal function is to determine if it safe for him to continue the diclofenac.  I have recommended that he use the diclofenac for his knee sparingly.  He can take it 1 to 2 days a week and drink plenty of water with it I believe this would be safe.  Regarding his heel he can use Voltaren gel 2 g applied 4 times daily as needed for pain.  Meanwhile I will consult podiatry.

## 2019-01-06 ENCOUNTER — Telehealth: Payer: Self-pay | Admitting: *Deleted

## 2019-01-06 NOTE — Telephone Encounter (Signed)
Your information has been submitted to [employer] [place]. [employer] [place] will review the request and notify you of the determination decision directly, typically within 3 business days of your submission and once all necessary information is received.  You will also receive your request decision electronically. To check for an update later, open the request again from your dashboard.  If [employer] [place] has not responded within the specified timeframe or if you have any questions about your PA submission, contact [employer] [place] directly at (MAPD) [phone] or (PDP) [phone]. 

## 2019-01-06 NOTE — Telephone Encounter (Signed)
Received request from pharmacy for PA on Voltaren Gel.   PA submitted.   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

## 2019-01-06 NOTE — Telephone Encounter (Signed)
Received PA determination.   PA approved effective from 01/06/2019 through 01/06/2020

## 2019-01-07 ENCOUNTER — Other Ambulatory Visit: Payer: Self-pay

## 2019-01-07 ENCOUNTER — Ambulatory Visit: Payer: Medicare Other | Admitting: Podiatry

## 2019-01-07 ENCOUNTER — Encounter: Payer: Self-pay | Admitting: Podiatry

## 2019-01-07 ENCOUNTER — Ambulatory Visit (INDEPENDENT_AMBULATORY_CARE_PROVIDER_SITE_OTHER): Payer: Medicare Other

## 2019-01-07 VITALS — BP 123/87

## 2019-01-07 DIAGNOSIS — M79671 Pain in right foot: Secondary | ICD-10-CM | POA: Diagnosis not present

## 2019-01-07 DIAGNOSIS — M722 Plantar fascial fibromatosis: Secondary | ICD-10-CM

## 2019-01-07 DIAGNOSIS — M79672 Pain in left foot: Secondary | ICD-10-CM | POA: Diagnosis not present

## 2019-01-07 NOTE — Patient Instructions (Signed)
Fascitis plantar, rehabilitacin Plantar Fasciitis Rehab Pregunte al mdico qu ejercicios son seguros para usted. Haga los ejercicios exactamente como se lo haya indicado el mdico y gradelos como se lo hayan indicado. Es normal sentir un estiramiento leve, tironeo, opresin o Tree surgeon al [employer] [place]. Detngase de inmediato si siente un dolor repentino o Printmaker. No comience a hacer estos ejercicios hasta que se lo indique el mdico. Ejercicios de elongacin y amplitud de movimiento Estos ejercicios calientan los msculos y las articulaciones, y mejoran la movilidad y la flexibilidad del pie. Adems, ayudan a Best boy. Estiramiento de la fascia plantar  1. Sintese con la pierna izquierda/derecha cruzada sobre la rodilla opuesta. 2. Sostenga el taln con [place], con el pulgar cerca del arco. Con la otra mano, sostenga los dedos de los pies y empjelos con [place]. Debe sentir un estiramiento en la parte de abajo de los dedos o del pie (fascia plantar), o de ambos. 3. Mantenga esta posicin durante _________ segundos. 4. Afloje lentamente los dedos y vuelva a la posicin inicial. Repita __________ veces. Realice este ejercicio __________ veces al da. Estiramiento de los gemelos, de pie Este ejercicio tambin se denomina estiramiento de la pantorrilla (los msculos gemelos). Estira los msculos posteriores de la parte superior de la pantorrilla. 1. Prese con las manos [employer] pared. 2. Extienda la pierna izquierda/derecha hacia atrs y flexione ligeramente la rodilla de la pierna de adelante. 3. Mantenga los talones apoyados en el suelo y la rodilla de atrs extendida, y lleve el peso hacia la pared. No arquee la espalda. Debe sentir un estiramiento suave en la parte superior de la pantorrilla izquierda/derecha. 4. Mantenga esta posicin durante __________ segundos. Repita __________ veces. Realice este ejercicio __________ veces al da.  Estiramiento del msculo sleo, de pie Este ejercicio tambin se denomina estiramiento de la pantorrilla (sleo). Estira los msculos posteriores de la parte inferior de la pantorrilla. 1. Prese con las manos [employer] pared. 2. Extienda la pierna izquierda/derecha hacia atrs y flexione ligeramente la rodilla de la pierna de adelante. 3. Mantenga los talones apoyados en el suelo, flexione la rodilla de atrs y lleve el peso ligeramente a la pierna de atrs. Debe sentir un estiramiento suave en la parte profunda de la parte inferior de la pantorrilla. 4. Mantenga esta posicin durante __________ segundos. Repita __________ veces. Realice este ejercicio __________ veces al da. Estiramiento de los [employer] gemelos y sleo, de pie con un escaln Este ejercicio estira los msculos posteriores de la parte inferior de la pierna. Estos msculos se encuentran en la parte superior de la pantorrilla (gastrocnemio) y la parte inferior de la pantorrilla (sleo). 1. Prese sobre un escaln apoyando solo la regin metatarsiana de su pie derecho/izquierdo. La regin metatarsiana del pie es la superficie sobre la que caminamos, justo debajo de los dedos. 2. Mantenga el otro pie apoyado con firmeza en el mismo escaln. 3. Sostngase de la pared o de una baranda para mantener el equilibrio. 4. [place] lentamente el otro pie y deje que el peso del cuerpo presione el taln izquierdo/derecho sobre el borde del escaln. Debe sentir un estiramiento en la pantorrilla izquierda/derecha. 5. Mantenga esta posicin durante __________ segundos. 6. Vuelva a poner ambos pies sobre el escaln. 7. Repita este ejercicio con una leve flexin en la rodilla izquierda/derecha. Reptalo __________ veces con la rodilla izquierda/derecha extendida y __________ veces con la rodilla izquierda/derecha flexionada. Realice este ejercicio __________ veces al da. Ejercicio de  equilibrio Este ejercicio aumenta el equilibrio y el  control de la fuerza del arco, para ayudar a reducir la presin sobre la fascia plantar. Pararse sobre una pierna Si este ejercicio es muy fcil, puede intentar hacerlo con los ojos cerrados o parado sobre [place]. 1. Sin calzado, prese cerca de una baranda o [place]. Puede sostenerse de la baranda o del marco de la puerta, segn lo necesite. 2. Prese sobre el pie izquierdo/derecho. Sin despegar el dedo gordo del suelo, intente mantener el arco levantado. No deje que el pie se vaya hacia adentro. 3. Mantenga esta posicin durante __________ segundos. Repita __________ veces. Realice este ejercicio __________ veces al da. Esta informacin no tiene Marine scientist el consejo del mdico. Asegrese de hacerle al mdico cualquier pregunta que tenga. Document Released: 12/28/2005 Document Revised: 02/13/2018 Document Reviewed: 02/13/2018 Elsevier Patient Education  2020 [employer].

## 2019-01-08 ENCOUNTER — Encounter: Payer: Self-pay | Admitting: Podiatry

## 2019-01-08 NOTE — Progress Notes (Signed)
Subjective:  Patient ID: Patrick Marshall, male    DOB: May 23, 1945,  MRN: [id number]  Chief Complaint  Patient presents with  . Foot Pain    pt is here for plantar fasciitis of the right foot, pt states that pain has been going on for awhile, pt stated he twisted his knee awhile back, pt has tried icing it, but only to his knee    73 y.o. male presents with the above complaint.  Patient states that he has bilateral heel pain.  Especially when he gets up from the bed in the morning and takes a 4 step the pain is unbearable then goes little better as it progresses throughout the day but then at and then stays the same.  He has not tried anything conservative at home.  It hurts him the most when he is ambulating.   Review of Systems: Negative except as noted in the HPI. Denies N/V/F/Ch.  Past Medical History:  Diagnosis Date  . Diabetes mellitus without complication ([place])    x few years.  . Hypertension     Current Outpatient Medications:  .  allopurinol (ZYLOPRIM) 100 MG tablet, Take 1.5 tablets (150 mg total) by mouth daily., Disp: 180 tablet, Rfl: 2 .  colchicine (COLCRYS) 0.6 MG tablet, Take 1 tablet by mouth 2(times) daily as needed for gout flare up, Disp: 60 tablet, Rfl: 2 .  diclofenac (VOLTAREN) 75 MG EC tablet, Take 1 tablet (75 mg total) by mouth 2 (two) times daily., Disp: 30 tablet, Rfl: 0 .  diclofenac (VOLTAREN) 75 MG EC tablet, Take 1 tablet (75 mg total) by mouth 2 (two) times daily., Disp: 30 tablet, Rfl: 0 .  diclofenac sodium (VOLTAREN) 1 % GEL, Apply 2 g topically 4 (four) times daily., Disp: 100 g, Rfl: 2 .  lisinopril (ZESTRIL) 10 MG tablet, Take 1 tablet (10 mg total) by mouth daily., Disp: 90 tablet, Rfl: 1 .  metFORMIN (GLUCOPHAGE) 500 MG tablet, Take 1 tablet (500 mg total) by mouth 2 (two) times daily., Disp: 180 tablet, Rfl: 1 .  simvastatin (ZOCOR) 10 MG tablet, Take 1 tablet (10 mg total) by mouth at bedtime., Disp: 90 tablet, Rfl: 1  Social History    Tobacco Use  Smoking Status Never Smoker  Smokeless Tobacco Never Used    Allergies  Allergen Reactions  . Penicillins Other (See Comments)    Difficulty sleeping   Objective:   Vitals:   01/07/19 1621  BP: 123/87   There is no height or weight on file to calculate BMI. Constitutional Well developed. Well nourished.  Vascular Dorsalis pedis pulses palpable bilaterally. Posterior tibial pulses palpable bilaterally. Capillary refill normal to all digits.  No cyanosis or clubbing noted. Pedal hair growth normal.  Neurologic Normal speech. Oriented to person, place, and time. Epicritic sensation to light touch grossly present bilaterally.  Dermatologic Nails well groomed and normal in appearance. No open wounds. No skin lesions.  Orthopedic: Normal joint ROM without pain or crepitus bilaterally. No visible deformities. Tender to palpation at the calcaneal tuber bilaterally. No pain with calcaneal squeeze bilaterally. Ankle ROM diminished range of motion bilaterally. Silf[place]kiold Test: positive bilaterally.   Radiographs: Taken and reviewed. No acute fractures or dislocations. No evidence of stress fracture.  Plantar heel spur present. Posterior heel spur present.   Assessment:   1. Plantar fasciitis, right    Plan:  Patient was evaluated and treated and all questions answered.  Plantar Fasciitis, bilaterally - XR reviewed as above.  -  Educated on icing and stretching. Instructions given.  - Injection delivered to the plantar fascia as below. - DME: Plantar Fascial Brace bilateral - Pharmacologic management:  Educated on risks/benefits and proper taking of medication. -I educated him on the etiology and various treatment options of plantar fasciitis.  Procedure: Injection Tendon/Ligament Location: Bilateral plantar fascia at the glabrous junction; medial approach. Skin Prep: alcohol Injectate: 0.5 cc 0.5% marcaine plain, 0.5 cc of 1% Lidocaine, 0.5 cc kenalog 10.  Disposition: Patient tolerated procedure well. Injection site dressed with a band-aid.  No follow-ups on file.

## 2019-01-31 DIAGNOSIS — H5712 Ocular pain, left eye: Secondary | ICD-10-CM | POA: Diagnosis not present

## 2019-01-31 DIAGNOSIS — H401132 Primary open-angle glaucoma, bilateral, moderate stage: Secondary | ICD-10-CM | POA: Diagnosis not present

## 2019-01-31 DIAGNOSIS — H20042 Secondary noninfectious iridocyclitis, left eye: Secondary | ICD-10-CM | POA: Diagnosis not present

## 2019-02-07 ENCOUNTER — Ambulatory Visit: Payer: Medicare Other | Admitting: Podiatry

## 2019-02-07 ENCOUNTER — Other Ambulatory Visit: Payer: Self-pay

## 2019-02-07 DIAGNOSIS — M79672 Pain in left foot: Secondary | ICD-10-CM

## 2019-02-07 DIAGNOSIS — M79671 Pain in right foot: Secondary | ICD-10-CM

## 2019-02-07 DIAGNOSIS — M2141 Flat foot [pes planus] (acquired), right foot: Secondary | ICD-10-CM | POA: Diagnosis not present

## 2019-02-07 DIAGNOSIS — M722 Plantar fascial fibromatosis: Secondary | ICD-10-CM

## 2019-02-07 DIAGNOSIS — E119 Type 2 diabetes mellitus without complications: Secondary | ICD-10-CM

## 2019-02-07 DIAGNOSIS — M205X9 Other deformities of toe(s) (acquired), unspecified foot: Secondary | ICD-10-CM

## 2019-02-07 DIAGNOSIS — M2142 Flat foot [pes planus] (acquired), left foot: Secondary | ICD-10-CM

## 2019-02-11 ENCOUNTER — Ambulatory Visit: Payer: Medicare Other | Admitting: Podiatry

## 2019-02-12 ENCOUNTER — Encounter: Payer: Self-pay | Admitting: Podiatry

## 2019-02-12 NOTE — Progress Notes (Signed)
Subjective:  Patient ID: Patrick Marshall, male    DOB: Sep 14, 1945,  MRN: [id number]  Chief Complaint  Patient presents with  . Foot Pain    pt is here for f/u of foot pain to the right foot, pt states that he is still in pain and is requesting more injesting    73 y.o. male presents with the above complaint.  Patient is here in follow-up to bilateral lower extremity plantar fasciitis.  Patient had injection done when I last saw him.  Patient states that the pain is still present it has decreased after injection but not completely.  Patient has tried the plantar fascial braces which has helped a little bit but not fully.  Patient states that the injection helped and is requesting more to completely alleviate the pain from plantar fasciitis.  Patient would like to know if there is any long-term management to help alleviate the pain.  He denies any other acute complaints.  He has been ambulating in regular sneakers.   Review of Systems: Negative except as noted in the HPI. Denies N/V/F/Ch.  Past Medical History:  Diagnosis Date  . Diabetes mellitus without complication ([place])    x few years.  . Hypertension     Current Outpatient Medications:  .  allopurinol (ZYLOPRIM) 100 MG tablet, Take 1.5 tablets (150 mg total) by mouth daily., Disp: 180 tablet, Rfl: 2 .  colchicine (COLCRYS) 0.6 MG tablet, Take 1 tablet by mouth 2(times) daily as needed for gout flare up, Disp: 60 tablet, Rfl: 2 .  diclofenac (VOLTAREN) 75 MG EC tablet, Take 1 tablet (75 mg total) by mouth 2 (two) times daily., Disp: 30 tablet, Rfl: 0 .  diclofenac (VOLTAREN) 75 MG EC tablet, Take 1 tablet (75 mg total) by mouth 2 (two) times daily., Disp: 30 tablet, Rfl: 0 .  diclofenac sodium (VOLTAREN) 1 % GEL, Apply 2 g topically 4 (four) times daily., Disp: 100 g, Rfl: 2 .  lisinopril (ZESTRIL) 10 MG tablet, Take 1 tablet (10 mg total) by mouth daily., Disp: 90 tablet, Rfl: 1 .  metFORMIN (GLUCOPHAGE) 500 MG tablet, Take 1 tablet  (500 mg total) by mouth 2 (two) times daily., Disp: 180 tablet, Rfl: 1 .  simvastatin (ZOCOR) 10 MG tablet, Take 1 tablet (10 mg total) by mouth at bedtime., Disp: 90 tablet, Rfl: 1  Social History   Tobacco Use  Smoking Status Never Smoker  Smokeless Tobacco Never Used    Allergies  Allergen Reactions  . Penicillins Other (See Comments)    Difficulty sleeping   Objective:  There were no vitals filed for this visit. There is no height or weight on file to calculate BMI. Constitutional Well developed. Well nourished.  Vascular Dorsalis pedis pulses palpable bilaterally. Posterior tibial pulses palpable bilaterally. Capillary refill normal to all digits.  No cyanosis or clubbing noted. Pedal hair growth normal.  Neurologic Normal speech. Oriented to person, place, and time. Epicritic sensation to light touch grossly present bilaterally.  Dermatologic Nails well groomed and normal in appearance. No open wounds. No skin lesions.  Orthopedic: Normal joint ROM without pain or crepitus bilaterally. No visible deformities. Tender to palpation at the calcaneal tuber bilaterally. No pain with calcaneal squeeze bilaterally. Ankle ROM diminished range of motion bilaterally.    Radiographs: None  Assessment:   1. Plantar fasciitis, right   2. Plantar fasciitis, left   3. Pain in right foot   4. Left foot pain   5. Pes planus of both  feet   6. Type 2 diabetes mellitus without complication, without long-term current use of insulin (HCC)   7. Toe contracture, unspecified laterality    Plan:  Patient was evaluated and treated and all questions answered.  Plantar Fasciitis, bilaterally - XR reviewed as above.  -Re-educated on icing and stretching. Instructions given.  - Injection delivered to the plantar fascia as below. - DME: None - Pharmacologic management: Educated on risks/benefits and proper taking of medication.  Bilateral pes planus deformity -I explained to the  patient the etiology and various treatment options available about treating bilateral pes planus deformity.  Given the patient is a diabetic with increase area of sensory pressure points.  I believe patient will benefit from diabetic insoles. Patient will follow up with Patrick Marshall to obtain the diabetic insoles made with diabetic shoes.  Procedure: Injection Tendon/Ligament Location: Bilateral plantar fascia at the glabrous junction; medial approach. Skin Prep: alcohol Injectate: 0.5 cc 0.5% marcaine plain, 0.5 cc of 1% Lidocaine, 0.5 cc kenalog 10. Disposition: Patient tolerated procedure well. Injection site dressed with a band-aid.  Return in about 1 week (around 02/14/2019) for Sched with Patrick Marshall for [employer].

## 2019-02-17 ENCOUNTER — Other Ambulatory Visit: Payer: Self-pay

## 2019-02-17 ENCOUNTER — Ambulatory Visit: Payer: Medicare Other | Admitting: Orthotics

## 2019-02-17 DIAGNOSIS — E119 Type 2 diabetes mellitus without complications: Secondary | ICD-10-CM

## 2019-02-17 DIAGNOSIS — M722 Plantar fascial fibromatosis: Secondary | ICD-10-CM

## 2019-02-17 DIAGNOSIS — M79671 Pain in right foot: Secondary | ICD-10-CM

## 2019-02-17 DIAGNOSIS — M2141 Flat foot [pes planus] (acquired), right foot: Secondary | ICD-10-CM

## 2019-02-17 NOTE — Progress Notes (Signed)
Patient has [employer] plan and due to financial considerations does not want to pursue with f/o at this time.

## 2019-02-25 DIAGNOSIS — H401132 Primary open-angle glaucoma, bilateral, moderate stage: Secondary | ICD-10-CM | POA: Diagnosis not present

## 2019-02-25 DIAGNOSIS — Z961 Presence of intraocular lens: Secondary | ICD-10-CM | POA: Diagnosis not present

## 2019-04-04 ENCOUNTER — Ambulatory Visit: Payer: Medicare Other | Admitting: Podiatry

## 2019-04-14 ENCOUNTER — Ambulatory Visit: Payer: Medicare Other | Admitting: Podiatry

## 2019-04-14 ENCOUNTER — Other Ambulatory Visit: Payer: Self-pay

## 2019-04-14 DIAGNOSIS — M722 Plantar fascial fibromatosis: Secondary | ICD-10-CM

## 2019-04-14 DIAGNOSIS — M76822 Posterior tibial tendinitis, left leg: Secondary | ICD-10-CM

## 2019-04-14 DIAGNOSIS — M2142 Flat foot [pes planus] (acquired), left foot: Secondary | ICD-10-CM | POA: Diagnosis not present

## 2019-04-14 DIAGNOSIS — M2141 Flat foot [pes planus] (acquired), right foot: Secondary | ICD-10-CM

## 2019-04-14 DIAGNOSIS — M76821 Posterior tibial tendinitis, right leg: Secondary | ICD-10-CM | POA: Diagnosis not present

## 2019-04-15 ENCOUNTER — Encounter: Payer: Self-pay | Admitting: Podiatry

## 2019-04-15 NOTE — Progress Notes (Addendum)
Subjective:  Patient ID: Patrick Marshall, male    DOB: 06-28-1945,  MRN: [id number]  No chief complaint on file.   74 y.o. male presents with the above complaint.  Patient is here to follow-up for bilateral plantar fasciitis.  Patient states that the heel pain has completely resolved he does not have any major pain to the heel.  However he is here with another complaint of bilateral posterior tibial tendon pain.  Patient states that this has been hurting him for quite some time now and has progressively gotten worse.  He would like to know if there is anything that could be done.  He states the pain is right posterior to the tibial plafond.  He denies doing anything for this.  He denies seeing anyone else for this.  He states that he will be going back to his [place] soon and would like to have this resolved prior to that.   Review of Systems: Negative except as noted in the HPI. Denies N/V/F/Ch.  Past Medical History:  Diagnosis Date  . Diabetes mellitus without complication ([place])    x few years.  . Hypertension     Current Outpatient Medications:  .  allopurinol (ZYLOPRIM) 100 MG tablet, Take 1.5 tablets (150 mg total) by mouth daily., Disp: 180 tablet, Rfl: 2 .  colchicine (COLCRYS) 0.6 MG tablet, Take 1 tablet by mouth 2(times) daily as needed for gout flare up, Disp: 60 tablet, Rfl: 2 .  diclofenac (VOLTAREN) 75 MG EC tablet, Take 1 tablet (75 mg total) by mouth 2 (two) times daily., Disp: 30 tablet, Rfl: 0 .  diclofenac (VOLTAREN) 75 MG EC tablet, Take 1 tablet (75 mg total) by mouth 2 (two) times daily., Disp: 30 tablet, Rfl: 0 .  diclofenac sodium (VOLTAREN) 1 % GEL, Apply 2 g topically 4 (four) times daily., Disp: 100 g, Rfl: 2 .  lisinopril (ZESTRIL) 10 MG tablet, Take 1 tablet (10 mg total) by mouth daily., Disp: 90 tablet, Rfl: 1 .  metFORMIN (GLUCOPHAGE) 500 MG tablet, Take 1 tablet (500 mg total) by mouth 2 (two) times daily., Disp: 180 tablet, Rfl: 1 .  simvastatin  (ZOCOR) 10 MG tablet, Take 1 tablet (10 mg total) by mouth at bedtime., Disp: 90 tablet, Rfl: 1  Social History   Tobacco Use  Smoking Status Never Smoker  Smokeless Tobacco Never Used    Allergies  Allergen Reactions  . Penicillins Other (See Comments)    Difficulty sleeping   Objective:  There were no vitals filed for this visit. There is no height or weight on file to calculate BMI. Constitutional Well developed. Well nourished.  Vascular Dorsalis pedis pulses palpable bilaterally. Posterior tibial pulses palpable bilaterally. Capillary refill normal to all digits.  No cyanosis or clubbing noted. Pedal hair growth normal.  Neurologic Normal speech. Oriented to person, place, and time. Epicritic sensation to light touch grossly present bilaterally.  Dermatologic Nails well groomed and normal in appearance. No open wounds. No skin lesions.  Orthopedic: Normal joint ROM without pain or crepitus bilaterally. No visible deformities. No pain on palpation to the bilateral calcaneal tuber.  No pain with range of motion.  Good ankle range of motion noted after doing stretching exercises.  Pain on palpation to the posterior tibial tendon bilaterally with right being worse than left.  Pain along the course of the tendon as it curves this way behind the posterior tibial plafond.  No pain on the insertion.  Pain with forced eversion of  the foot pain with active and passive inversion of the foot    Radiographs: None  Assessment:   No diagnosis found. Plan:  Patient was evaluated and treated and all questions answered.  Plantar Fasciitis, bilaterally -Resolved.  Patient states he no longer has any pain.  I have asked the patient to continue doing stretching exercises and wearing good supportive sneakers.  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  I explained to the patient the importance of orthotics and good supportive sneakers.  Patient was not able to get orthotics as they are too costly and would like to hold off on them for now.  I believe patient will benefit from a steroid injection to the point of maximal tenderness.  Patient agrees with the injection would like to proceed with injection to both the posterior tibial tendon but not within the tendon sheath itself.  I explained to the patient there is a high risk of rupture if injected within the tendon.  Patient states understanding would like to proceed with injection. -A steroid injection was performed at bilateral posterior ankle at the point of maximal tenderness using 1% plain Lidocaine and 10 mg of Kenalog. This was well tolerated. -ASO Tri-Lock ankle brace was dispensed to the right foot as this is giving him a lot more pain than the left side.  I have asked the patient he can switch back and forth if the right side pain resolves he can move all to the left side with it.  Patient states understanding.   Bilateral pes planus deformity -I explained to the patient the etiology and various treatment options available about treating bilateral pes planus deformity.  Given the patient is a diabetic with increase area of sensory pressure points.  I believe patient will benefit from diabetic insoles. Patient will follow up with Liliane Channel to obtain the diabetic insoles made with diabetic shoes.    Return if symptoms worsen or fail to improve.

## 2019-05-19 ENCOUNTER — Other Ambulatory Visit: Payer: Self-pay

## 2019-05-19 ENCOUNTER — Ambulatory Visit: Payer: Medicare Other | Attending: Internal Medicine

## 2019-05-19 DIAGNOSIS — Z20822 Contact with and (suspected) exposure to covid-19: Secondary | ICD-10-CM | POA: Diagnosis not present

## 2019-05-20 LAB — NOVEL CORONAVIRUS, NAA: SARS-CoV-2, NAA: NOT DETECTED

## 2019-09-04 ENCOUNTER — Telehealth: Payer: Self-pay | Admitting: Family Medicine

## 2019-09-04 ENCOUNTER — Other Ambulatory Visit: Payer: Self-pay

## 2019-09-04 DIAGNOSIS — E119 Type 2 diabetes mellitus without complications: Secondary | ICD-10-CM

## 2019-09-04 MED ORDER — METFORMIN HCL 500 MG PO TABS: 500.0000 mg | ORAL_TABLET | Freq: Two times a day (BID) | ORAL | 1 refills | Status: DC

## 2019-09-04 MED ORDER — LISINOPRIL 10 MG PO TABS: 10.0000 mg | ORAL_TABLET | Freq: Every day | ORAL | 1 refills | Status: DC

## 2019-09-04 NOTE — Telephone Encounter (Signed)
refilled 

## 2019-09-04 NOTE — Telephone Encounter (Signed)
CB# [phone] Refill Metformin, Zestril sent [facility]  [address]

## 2019-12-05 ENCOUNTER — Telehealth: Payer: Self-pay | Admitting: *Deleted

## 2019-12-05 NOTE — Telephone Encounter (Signed)
-----   Message from Susy Frizzle, MD sent at 12/04/2019  6:22 AM EDT ----- Patient needs an ov.  ----- Message ----- From: Cathi Roan, [facility] Sent: 12/02/2019   2:43 PM EDT To: Susy Frizzle, MD  Dr. Dennard Schaumann-   Per review of chart and payor information, patient has a diagnosis of diabetes but is not currently prescribed a statin.    He was previously taking simvastatin 10mg , but the prescription expired December 2020. Due to [place] pandemic, patient does not have an upcoming appointment scheduled with you this calendar year. If medically appropriate, would you like to have patient come in for labs or an office visit to assess hyperlipidemia?   10-year ASCVD risk: 35.9%  Last LDL: 92 mg/dL    Thank you for your time!   Ruben Reason, [place] [phone]

## 2019-12-05 NOTE — Telephone Encounter (Signed)
Letter sent.

## 2019-12-18 ENCOUNTER — Other Ambulatory Visit: Payer: Self-pay

## 2019-12-18 ENCOUNTER — Ambulatory Visit (INDEPENDENT_AMBULATORY_CARE_PROVIDER_SITE_OTHER): Payer: Medicare Other | Admitting: Family Medicine

## 2019-12-18 VITALS — BP 110/80 | HR 96 | Temp 98.2°F | Ht 63.0 in | Wt 235.0 lb

## 2019-12-18 DIAGNOSIS — E118 Type 2 diabetes mellitus with unspecified complications: Secondary | ICD-10-CM

## 2019-12-18 DIAGNOSIS — E785 Hyperlipidemia, unspecified: Secondary | ICD-10-CM

## 2019-12-18 DIAGNOSIS — I10 Essential (primary) hypertension: Secondary | ICD-10-CM | POA: Diagnosis not present

## 2019-12-18 DIAGNOSIS — I209 Angina pectoris, unspecified: Secondary | ICD-10-CM

## 2019-12-18 MED ORDER — ROSUVASTATIN CALCIUM 20 MG PO TABS: 20.0000 mg | ORAL_TABLET | Freq: Every day | ORAL | 3 refills | Status: DC

## 2019-12-18 MED ORDER — METOPROLOL SUCCINATE ER 25 MG PO TB24: 12.5000 mg | ORAL_TABLET | Freq: Every day | ORAL | 3 refills | Status: DC

## 2019-12-18 MED ORDER — NITROGLYCERIN 0.4 MG SL SUBL: 0.4000 mg | SUBLINGUAL_TABLET | SUBLINGUAL | 3 refills | Status: DC | PRN

## 2019-12-18 NOTE — Progress Notes (Signed)
Subjective:    Patient ID: Patrick Marshall, male    DOB: May 27, 1945, 74 y.o.   MRN: [id number]  HPI  Patient is a very pleasant 74 year old Hispanic gentleman who presents today complaining of shortness of breath and dyspnea on exertion.  He states that for the last several months he has been coming increasingly short of breath.  It occurs with activity.  He is noticing it more frequently and with less activity now.  He also reports a tightness and a fullness in his chest with activity that improves with rest.  He denies any pain.  He denies any nausea or vomiting.  He does report a pressure-like sensation in his chest.  He denies any syncope or near syncope or palpitations.  He denies any pleurisy or cough or hemoptysis.  Patient had a stress test of his heart in 2019 that was normal.  He has not had blood work in more than a year to check his diabetes or his cholesterol or his blood counts.  He is taking a baby aspirin.  He denies any orthopnea or paroxysmal nocturnal dyspnea. Past Medical History:  Diagnosis Date  . Diabetes mellitus without complication ([place])    x few years.  . Hypertension    Past Surgical History:  Procedure Laterality Date  . CHOLECYSTECTOMY    . EYE SURGERY     2016 both eyes   Current Outpatient Medications on File Prior to Visit  Medication Sig Dispense Refill  . allopurinol (ZYLOPRIM) 100 MG tablet Take 1.5 tablets (150 mg total) by mouth daily. 180 tablet 2  . colchicine (COLCRYS) 0.6 MG tablet Take 1 tablet by mouth 2(times) daily as needed for gout flare up 60 tablet 2  . diclofenac (VOLTAREN) 75 MG EC tablet Take 1 tablet (75 mg total) by mouth 2 (two) times daily. 30 tablet 0  . diclofenac (VOLTAREN) 75 MG EC tablet Take 1 tablet (75 mg total) by mouth 2 (two) times daily. 30 tablet 0  . diclofenac sodium (VOLTAREN) 1 % GEL Apply 2 g topically 4 (four) times daily. 100 g 2  . lisinopril (ZESTRIL) 10 MG tablet Take 1 tablet (10 mg total) by mouth daily. 90  tablet 1  . metFORMIN (GLUCOPHAGE) 500 MG tablet Take 1 tablet (500 mg total) by mouth 2 (two) times daily. 180 tablet 1  . simvastatin (ZOCOR) 10 MG tablet Take 1 tablet (10 mg total) by mouth at bedtime. 90 tablet 1   No current facility-administered medications on file prior to visit.   Allergies  Allergen Reactions  . Penicillins Other (See Comments)    Difficulty sleeping   Social History   Socioeconomic History  . Marital status: Married    Spouse name: Not on file  . Number of children: Not on file  . Years of education: Not on file  . Highest education level: Not on file  Occupational History  . Occupation: Interior and spatial designer  Tobacco Use  . Smoking status: Never Smoker  . Smokeless tobacco: Never Used  Substance and Sexual Activity  . Alcohol use: Yes    Comment: occasion  . Drug use: No  . Sexual activity: Not on file  Other Topics Concern  . Not on file  Social History Narrative  . Not on file   Social Determinants of Health   Financial Resource Strain:   . Difficulty of Paying Living Expenses: Not on file  Food Insecurity:   . Worried About Charity fundraiser in the  Last Year: Not on file  . Ran Out of Food in the Last Year: Not on file  Transportation Needs:   . Lack of Transportation (Medical): Not on file  . Lack of Transportation (Non-Medical): Not on file  Physical Activity:   . Days of Exercise per Week: Not on file  . Minutes of Exercise per Session: Not on file  Stress:   . Feeling of Stress : Not on file  Social Connections:   . Frequency of Communication with Friends and Family: Not on file  . Frequency of Social Gatherings with Friends and Family: Not on file  . Attends Religious Services: Not on file  . Active Member of Clubs or Organizations: Not on file  . Attends Archivist Meetings: Not on file  . Marital Status: Not on file  Intimate Partner Violence:   . Fear of Current or Ex-Partner: Not on file  . Emotionally Abused: Not on  file  . Physically Abused: Not on file  . Sexually Abused: Not on file      Review of Systems  All other systems reviewed and are negative.      Objective:   Physical Exam Vitals reviewed.  Constitutional:      General: He is not in acute distress.    Appearance: Normal appearance. He is obese. He is not ill-appearing or toxic-appearing.  HENT:     Right Ear: Tympanic membrane and ear canal normal.     Left Ear: Tympanic membrane and ear canal normal.  Neck:     Vascular: No carotid bruit.  Cardiovascular:     Rate and Rhythm: Normal rate and regular rhythm.     Pulses: Normal pulses.     Heart sounds: Normal heart sounds. No murmur heard.  No friction rub. No gallop.   Pulmonary:     Effort: Pulmonary effort is normal. No respiratory distress.     Breath sounds: Normal breath sounds. No stridor. No wheezing, rhonchi or rales.  Chest:     Chest wall: No tenderness.  Abdominal:     General: Abdomen is flat. Bowel sounds are normal. There is no distension.     Palpations: Abdomen is soft.     Tenderness: There is no abdominal tenderness. There is no guarding.  Musculoskeletal:     Right lower leg: No edema.     Left lower leg: No edema.  Neurological:     Mental Status: He is alert.           Assessment & Plan:  Controlled type 2 diabetes mellitus with complication, without long-term current use of insulin (HCC) - Plan: Hemoglobin A1c, COMPLETE METABOLIC PANEL WITH GFR, CBC with Differential/Platelet, Lipid panel, Microalbumin, urine  Hyperlipidemia, unspecified hyperlipidemia type  Essential hypertension  Angina pectoris ([place]) - Plan: EKG 12-Lead  Patient had a normal exercise treadmill test 2 years ago however his symptoms sound concerning for angina.  I suspect that the patient has underlying coronary artery disease.  He certainly has risk factors including age, hypertension, and diabetes.  He is taking an aspirin 81 mg daily.  I will give the patient  nitroglycerin 0.4 mg sublingual as needed chest pain.  We will start the patient back on a high intensity statin Crestor 20 mg a day.  We will also start the patient on a low-dose beta-blocker.  His blood pressure is relatively low and therefore we will need to be very careful with this.  I will start him on 25 mg  tablets, he will take a half a tablet a day.  Consult cardiology as soon as possible as the patient requires evaluation for coronary artery disease.  EKG today is very reassuring.  Shows normal sinus rhythm with normal intervals and a normal axis with no evidence of ischemia or infarction.  Patient does have ST segment depression in lead III and nonspecific ST changes in aVF which are nonspecific.

## 2019-12-19 LAB — COMPLETE METABOLIC PANEL WITH GFR
AG Ratio: 1.7 (calc) (ref 1.0–2.5)
ALT: 24 U/L (ref 9–46)
AST: 21 U/L (ref 10–35)
Albumin: 4.3 g/dL (ref 3.6–5.1)
Alkaline phosphatase (APISO): 92 U/L (ref 35–144)
BUN: 14 mg/dL (ref 7–25)
CO2: 29 mmol/L (ref 20–32)
Calcium: 9.5 mg/dL (ref 8.6–10.3)
Chloride: 102 mmol/L (ref 98–110)
Creat: 1.15 mg/dL (ref 0.70–1.18)
GFR, Est African American: 73 mL/min/{1.73_m2} (ref >=60)
GFR, Est Non African American: 63 mL/min/{1.73_m2} (ref >=60)
Globulin: 2.5 g/dL (calc) (ref 1.9–3.7)
Glucose, Bld: 131 mg/dL — ABNORMAL HIGH (ref 65–99)
Potassium: 4.5 mmol/L (ref 3.5–5.3)
Sodium: 139 mmol/L (ref 135–146)
Total Bilirubin: 0.4 mg/dL (ref 0.2–1.2)
Total Protein: 6.8 g/dL (ref 6.1–8.1)

## 2019-12-19 LAB — CBC WITH DIFFERENTIAL/PLATELET
Absolute Monocytes: 538 cells/uL (ref 200–950)
Basophils Absolute: 69 cells/uL (ref 0–200)
Basophils Relative: 1 %
Eosinophils Absolute: 207 cells/uL (ref 15–500)
Eosinophils Relative: 3 %
HCT: 42.4 % (ref 38.5–50.0)
Hemoglobin: 14 g/dL (ref 13.2–17.1)
Lymphs Abs: 1911 cells/uL (ref 850–3900)
MCH: 31.5 pg (ref 27.0–33.0)
MCHC: 33 g/dL (ref 32.0–36.0)
MCV: 95.3 fL (ref 80.0–100.0)
MPV: 11.5 fL (ref 7.5–12.5)
Monocytes Relative: 7.8 %
Neutro Abs: 4175 cells/uL (ref 1500–7800)
Neutrophils Relative %: 60.5 %
Platelets: 258 10*3/uL (ref 140–400)
RBC: 4.45 10*6/uL (ref 4.20–5.80)
RDW: 12.7 % (ref 11.0–15.0)
Total Lymphocyte: 27.7 %
WBC: 6.9 10*3/uL (ref 3.8–10.8)

## 2019-12-19 LAB — LIPID PANEL
Cholesterol: 130 mg/dL (ref <200)
HDL: 57 mg/dL (ref >=40)
LDL Cholesterol (Calc): 44 mg/dL (calc)
Non-HDL Cholesterol (Calc): 73 mg/dL (calc) (ref <130)
Total CHOL/HDL Ratio: 2.3 (calc) (ref <5.0)
Triglycerides: 236 mg/dL — ABNORMAL HIGH (ref <150)

## 2019-12-19 LAB — HEMOGLOBIN A1C
Hgb A1c MFr Bld: 7 % of total Hgb — ABNORMAL HIGH (ref <5.7)
Mean Plasma Glucose: 154 (calc)
eAG (mmol/L): 8.5 (calc)

## 2019-12-19 LAB — MICROALBUMIN, URINE: Microalb, Ur: 0.2 mg/dL

## 2020-01-13 ENCOUNTER — Ambulatory Visit: Payer: Medicare Other | Admitting: Cardiology

## 2020-01-13 ENCOUNTER — Other Ambulatory Visit: Payer: Self-pay

## 2020-01-13 ENCOUNTER — Encounter: Payer: Self-pay | Admitting: Cardiology

## 2020-01-13 VITALS — BP 128/80 | HR 80 | Ht 63.0 in | Wt 235.0 lb

## 2020-01-13 DIAGNOSIS — E785 Hyperlipidemia, unspecified: Secondary | ICD-10-CM

## 2020-01-13 DIAGNOSIS — Z01812 Encounter for preprocedural laboratory examination: Secondary | ICD-10-CM | POA: Diagnosis not present

## 2020-01-13 DIAGNOSIS — E8881 Metabolic syndrome: Secondary | ICD-10-CM

## 2020-01-13 DIAGNOSIS — R072 Precordial pain: Secondary | ICD-10-CM

## 2020-01-13 DIAGNOSIS — E119 Type 2 diabetes mellitus without complications: Secondary | ICD-10-CM | POA: Diagnosis not present

## 2020-01-13 DIAGNOSIS — I10 Essential (primary) hypertension: Secondary | ICD-10-CM

## 2020-01-13 DIAGNOSIS — Z6841 Body Mass Index (BMI) 40.0 and over, adult: Secondary | ICD-10-CM

## 2020-01-13 NOTE — Patient Instructions (Addendum)
Medication Instructions:  Your Physician recommend you continue on your current medication as directed.    *If you need a refill on your cardiac medications before your next appointment, please call your pharmacy*   Lab Work: Your physician recommends that you return for lab work in 1 week prior to procedure ( BMP).  If you have labs (blood work) drawn today and your tests are completely normal, you will receive your results only by: Marland Kitchen MyChart Message (if you have MyChart) OR . A paper copy in the mail If you have any lab test that is abnormal or we need to change your treatment, we will call you to review the results.   Testing/Procedures: Cardiac CT Angiography (CTA), is a special type of CT scan that uses a computer to produce multi-dimensional views of major blood vessels throughout the body. In CT angiography, a contrast material is injected through an IV to help visualize the blood vessels  [facility]    Follow-Up: At [facility], you and your health needs are our priority.  As part of our continuing mission to provide you with exceptional heart care, we have created designated Provider Care Teams.  These Care Teams include your primary Cardiologist (physician) and Advanced Practice Providers (APPs -  Physician Assistants and Nurse Practitioners) who all work together to provide you with the care you need, when you need it.  We recommend signing up for the patient portal called "MyChart".  Sign up information is provided on this After Visit Summary.  MyChart is used to connect with patients for Virtual Visits (Telemedicine).  Patients are able to view lab/test results, encounter notes, upcoming appointments, etc.  Non-urgent messages can be sent to your provider as well.   To learn more about what you can do with MyChart, go to [email or web address].    Your next appointment:   As needed  The format for your next appointment:   In Person  Provider:   Buford Dresser, MD  Your cardiac CT will be scheduled at one of the below locations:   [facility] [address] [place], [place] [address] [phone]  If scheduled at [facility], please arrive at the [facility] main entrance of [facility] 30 minutes prior to test start time. Proceed to the [facility] Radiology Department (first floor) to check-in and test prep.  Please follow these instructions carefully (unless otherwise directed):  Hold all erectile dysfunction medications at least 3 days (72 hrs) prior to test.  On the Night Before the Test: . Be sure to Drink plenty of water. . Do not consume any caffeinated/decaffeinated beverages or chocolate 12 hours prior to your test. . Do not take any antihistamines 12 hours prior to your test.   On the Day of the Test: . Drink plenty of water. Do not drink any water within one hour of the test. . Do not eat any food 4 hours prior to the test. . You may take your regular medications prior to the test.  . Take metoprolol 25 mg ( 1 whole tablet) two hours prior to test.        After the Test: . Drink plenty of water. . After receiving IV contrast, you may experience a mild flushed feeling. This is normal. . On occasion, you may experience a mild rash up to 24 hours after the test. This is not dangerous. If this occurs, you can take Benadryl 25 mg and increase your fluid intake. Marland Kitchen  If you experience trouble breathing, this can be serious. If it is severe call 911 IMMEDIATELY. If it is mild, please call our office. . If you take any of these medications: Glipizide/Metformin, Avandament, Glucavance, please do not take 48 hours after completing test unless otherwise instructed.   Once we have confirmed authorization from your insurance company, we will call you to set up a date and time for your test. Based on how quickly your insurance processes prior authorizations requests, please allow up to 4 weeks to be  contacted for scheduling your Cardiac CT appointment. Be advised that routine Cardiac CT appointments could be scheduled as many as 8 weeks after your provider has ordered it.  For non-scheduling related questions, please contact the cardiac imaging nurse navigator should you have any questions/concerns: Marchia Bond, Cardiac Imaging Nurse Navigator Burley Saver, Interim Cardiac Imaging Nurse [facility] Direct Office Dial: [phone]   For scheduling needs, including cancellations and rescheduling, please call Vivien Rota at [phone], option 3.

## 2020-01-13 NOTE — Progress Notes (Signed)
Cardiology Office Note:    Date:  01/13/2020   ID:  Patrick Marshall, DOB June 04, 1945, MRN [id number]  PCP:  Patrick Frizzle, MD  Cardiologist:  Patrick Dresser, MD  Referring MD: Patrick Frizzle, MD   CC: dyspnea on exertion, chest pain  History of Present Illness:    Patrick Marshall is a 74 y.o. male with a hx of hypertension, type II diabetes, obesity who is seen as a new patient to me at the request of Patrick Frizzle, MD for the evaluation and management of dyspnea on exertion. He was previously seen by Dr. Percival Marshall in 2019 and is not new to our practice.   Note from 12/18/19 with Dr. Dennard Marshall reviewed. Noted several months of worsening dyspnea on exertion, along with a tightness in his chest with activity. Prior treadmill stress test in 2019 was unremarkable.  Here with Marshall interpreter today, Patrick Marshall, with [place].  Chest pain: Has chest discomfort, not a clear pain, sometimes radiates up to shoulder. Feels tingling, like small bites in his chest. Feels like his heart is large in his chest, can't catch his breath when laying down. Has been happening for some time, but recently has become more frequent. Happening almost every night, worse laying down. Stopped eating dinner as this was making the symptoms worse. Feels like there is gas in his stomach. Has tried nitro twice, not sure that it helped. -Aggravating/alleviating factors: Worse with eating, lying down. Not occuring with activity. Only when laying down, better when he sits up. Tends to wake him up around midnight with shortness of breath. Checks his pulse, feels like it is fast at this point but hasn't measured.  -Prior cardiac history: ETT 2019 unremarkable -Alcohol: occasional, but feels like when he has a beer his heart races -Tobacco: former smoker years ago, but none in many years -Comorbidities: hypertension, diabetes, obesity -Exercise level: works daily at a tobacco farm, very active, no  limitations. Has worked in the field his entire life.  -Cardiac ROS: no shortness of breath otherwise, no PND, no orthopnea, no LE edema, no syncope -Family history:  Father had heart issues, but were not related to his death. Died age 50.   On review with his visit with Dr. Percival Marshall in 2019, these symptoms are very similar to prior. Had ETT that was unremarkable.  Takes baby aspirin daily. Is not sure if he is taking atorvastatin or rosuvastatin, does not have his list with him today. Rosuvastatin appears most recent on his list.  Past Medical History:  Diagnosis Date  . Diabetes mellitus without complication ([place])    x few years.  . Hypertension     Past Surgical History:  Procedure Laterality Date  . CHOLECYSTECTOMY    . EYE SURGERY     2016 both eyes    Current Medications: Current Outpatient Medications on File Prior to Visit  Medication Sig  . aspirin EC 81 MG tablet Take 81 mg by mouth daily. Swallow whole.  . allopurinol (ZYLOPRIM) 100 MG tablet Take 1.5 tablets (150 mg total) by mouth daily.  Marland Kitchen atorvastatin (LIPITOR) 40 MG tablet Take 40 mg by mouth daily.  Marland Kitchen lisinopril (ZESTRIL) 10 MG tablet Take 1 tablet (10 mg total) by mouth daily.  . metFORMIN (GLUCOPHAGE) 500 MG tablet Take 1 tablet (500 mg total) by mouth 2 (two) times daily.  . metoprolol succinate (TOPROL-XL) 25 MG 24 hr tablet Take 0.5 tablets (12.5 mg total) by mouth daily.  . nitroGLYCERIN (  NITROSTAT) 0.4 MG SL tablet Place 1 tablet (0.4 mg total) under the tongue every 5 (five) minutes as needed for chest pain.  . rosuvastatin (CRESTOR) 20 MG tablet Take 1 tablet (20 mg total) by mouth daily.   No current facility-administered medications on file prior to visit.     Allergies:   Penicillins   Social History   Tobacco Use  . Smoking status: Never Smoker  . Smokeless tobacco: Never Used  Substance Use Topics  . Alcohol use: Yes    Comment: occasion  . Drug use: No    Family History: family  history includes Heart failure in his father.  ROS:   Please see the history of present illness.  Additional pertinent ROS otherwise unremarkable.     EKGs/Labs/Other Studies Reviewed:    The following studies were reviewed today: ETT 02-24-18  Blood pressure demonstrated a normal response to exercise.  There was no ST segment deviation noted during stress.   Normal ETT Patient only achieved 86% of PMHR Normal hemodynamic response Rare PVCls during stress and recovery   EKG:  EKG is personally reviewed.  The ekg ordered today demonstrates NSR at 80 bpm  Recent Labs: 12/18/2019: ALT 24; BUN 14; Creat 1.15; Hemoglobin 14.0; Platelets 258; Potassium 4.5; Sodium 139  Recent Lipid Panel    Component Value Date/Time   CHOL 130 12/18/2019 1223   TRIG 236 (H) 12/18/2019 1223   HDL 57 12/18/2019 1223   CHOLHDL 2.3 12/18/2019 1223   VLDL 31 (H) 10/31/2016 0919   LDLCALC 44 12/18/2019 1223    Physical Exam:    VS:  BP 128/80   Pulse 80   Ht 5' 3"  (1.6 m)   Wt 235 lb (106.6 kg)   SpO2 95%   BMI 41.63 kg/m     Wt Readings from Last 3 Encounters:  01/13/20 235 lb (106.6 kg)  12/18/19 235 lb (106.6 kg)  12/31/18 235 lb (106.6 kg)    GEN: Well nourished, well developed in no acute distress HEENT: Normal, moist mucous membranes NECK: No JVD CARDIAC: regular rhythm, normal S1 and S2, no rubs or gallops. No murmurs. VASCULAR: Radial and DP pulses 2+ bilaterally. No carotid bruits RESPIRATORY:  Clear to auscultation without rales, wheezing or rhonchi  ABDOMEN: Soft, non-tender, non-distended MUSCULOSKELETAL:  Ambulates independently SKIN: Warm and dry, no edema NEUROLOGIC:  Alert and oriented x 3. No focal neuro deficits noted. PSYCHIATRIC:  Normal affect    ASSESSMENT:    1. Precordial pain   2. Pre-procedure lab exam   3. Essential hypertension   4. Type 2 diabetes mellitus without complication, without long-term current use of insulin ([place])   5. Dyslipidemia   6.  Class 3 severe obesity with serious comorbidity and body mass index (BMI) of 40.0 to 44.9 in adult, unspecified obesity type ([place])   7. Metabolic syndrome    PLAN:    Precordial pain, with CV risk factors of type II diabetes, hypertension, hyperlipidemia, and obesity. This is consistent with metabolic syndrome -discussed treadmill stress (cannot do alone due to diabetes), nuclear stress/lexiscan, and CT coronary angiography. Discussed pros and cons of each, including but not limited to false positive/false negative risk, radiation risk, and risk of IV contrast dye. Based on shared decision making, decision was made to pursue CT coronary angiography. -will have him take twice his usual metoprolol (for a total of 25 mg) on the day of the test -counseled on need to get BMET prior to test -counseled on  use of sublingual nitroglycerin and its importance to a good test  Last A1c 7.0, on metformin for diabetes BP goal <130/80, at goal on lisinopril and metoprolol Lipids per [facility] in 11/2019 show Tchol 130, HDL 57, LDL 44, TG 236 (unknown if fasting). He has both atorvastatin and rosuvastatin listed. He is not sure which he is taking but will message me and let me know. BMI 41, would benefit from weight loss  Cardiac risk counseling and prevention recommendations: -recommend heart healthy/Mediterranean diet, with whole grains, fruits, vegetable, fish, lean meats, nuts, and olive oil. Limit salt. -recommend moderate walking, 3-5 times/week for 30-50 minutes each session. Aim for at least 150 minutes.week. Goal should be pace of 3 miles/hours, or walking 1.5 miles in 30 minutes -recommend avoidance of tobacco products. Avoid excess alcohol.:  -ASCVD risk score: The 10-year ASCVD risk score Mikey Bussing DC Brooke Bonito., et al., 2013) is: 35.8%   Values used to calculate the score:     Age: 43 years     Sex: Male     Is Non-Hispanic African American: No     Diabetic: Yes     Tobacco smoker: No     Systolic Blood  Pressure: 128 mmHg     Is BP treated: Yes     HDL Cholesterol: 57 mg/dL     Total Cholesterol: 130 mg/dL    Plan for follow up: to be determined based on results of testing.  Total time of encounter: 45 minutes total time of encounter, including 33 minutes spent in face-to-face patient care. This time includes coordination of care and counseling regarding chest pain, CAD, and CV risk. Remainder of non-face-to-face time involved reviewing chart documents/testing relevant to the patient encounter and documentation in the medical record.  Patrick Dresser, MD, PhD [place]  [facility]   Medication Adjustments/Labs and Tests Ordered: Current medicines are reviewed at length with the patient today.  Concerns regarding medicines are outlined above.  Orders Placed This Encounter  Procedures  . CT CORONARY MORPH W/CTA COR W/SCORE W/CA W/CM &/OR WO/CM  . CT CORONARY FRACTIONAL FLOW RESERVE DATA PREP  . CT CORONARY FRACTIONAL FLOW RESERVE FLUID ANALYSIS  . Basic metabolic panel   No orders of the defined types were placed in this encounter.   Patient Instructions  Medication Instructions:  Your Physician recommend you continue on your current medication as directed.    *If you need a refill on your cardiac medications before your next appointment, please call your pharmacy*   Lab Work: Your physician recommends that you return for lab work in 1 week prior to procedure ( BMP).  If you have labs (blood work) drawn today and your tests are completely normal, you will receive your results only by: Marland Kitchen MyChart Message (if you have MyChart) OR . A paper copy in the mail If you have any lab test that is abnormal or we need to change your treatment, we will call you to review the results.   Testing/Procedures: Cardiac CT Angiography (CTA), is a special type of CT scan that uses a computer to produce multi-dimensional views of major blood vessels throughout the body. In CT  angiography, a contrast material is injected through an IV to help visualize the blood vessels  [facility]    Follow-Up: At [facility], you and your health needs are our priority.  As part of our continuing mission to provide you with exceptional heart care, we have created designated Provider Care Teams.  These Care  Teams include your primary Cardiologist (physician) and Advanced Practice Providers (APPs -  Physician Assistants and Nurse Practitioners) who all work together to provide you with the care you need, when you need it.  We recommend signing up for the patient portal called "MyChart".  Sign up information is provided on this After Visit Summary.  MyChart is used to connect with patients for Virtual Visits (Telemedicine).  Patients are able to view lab/test results, encounter notes, upcoming appointments, etc.  Non-urgent messages can be sent to your provider as well.   To learn more about what you can do with MyChart, go to [email or web address].    Your next appointment:   As needed  The format for your next appointment:   In Person  Provider:   Buford Dresser, MD  Your cardiac CT will be scheduled at one of the below locations:   [facility] [address] [place], [place] [address] [phone]  If scheduled at [facility], please arrive at the [facility] main entrance of [facility] 30 minutes prior to test start time. Proceed to the [facility] Radiology Department (first floor) to check-in and test prep.  Please follow these instructions carefully (unless otherwise directed):  Hold all erectile dysfunction medications at least 3 days (72 hrs) prior to test.  On the Night Before the Test: . Be sure to Drink plenty of water. . Do not consume any caffeinated/decaffeinated beverages or chocolate 12 hours prior to your test. . Do not take any antihistamines 12 hours prior to your test.   On the Day of the  Test: . Drink plenty of water. Do not drink any water within one hour of the test. . Do not eat any food 4 hours prior to the test. . You may take your regular medications prior to the test.  . Take metoprolol 25 mg ( 1 whole tablet) two hours prior to test.        After the Test: . Drink plenty of water. . After receiving IV contrast, you may experience a mild flushed feeling. This is normal. . On occasion, you may experience a mild rash up to 24 hours after the test. This is not dangerous. If this occurs, you can take Benadryl 25 mg and increase your fluid intake. . If you experience trouble breathing, this can be serious. If it is severe call 911 IMMEDIATELY. If it is mild, please call our office. . If you take any of these medications: Glipizide/Metformin, Avandament, Glucavance, please do not take 48 hours after completing test unless otherwise instructed.   Once we have confirmed authorization from your insurance company, we will call you to set up a date and time for your test. Based on how quickly your insurance processes prior authorizations requests, please allow up to 4 weeks to be contacted for scheduling your Cardiac CT appointment. Be advised that routine Cardiac CT appointments could be scheduled as many as 8 weeks after your provider has ordered it.  For non-scheduling related questions, please contact the cardiac imaging nurse navigator should you have any questions/concerns: Marchia Bond, Cardiac Imaging Nurse Navigator Burley Saver, Interim Cardiac Imaging Nurse [place] [facility] Direct Office Dial: [phone]   For scheduling needs, including cancellations and rescheduling, please call Vivien Rota at [phone], option 3.       Signed, Patrick Dresser, MD PhD 01/13/2020 6:50 PM    [facility]

## 2020-01-16 NOTE — Addendum Note (Signed)
Addended by: Merri Ray A on: 01/16/2020 03:18 PM   Modules accepted: Orders

## 2020-01-20 ENCOUNTER — Encounter (HOSPITAL_COMMUNITY): Payer: Self-pay

## 2020-01-29 ENCOUNTER — Telehealth (HOSPITAL_COMMUNITY): Payer: Self-pay | Admitting: Emergency Medicine

## 2020-01-29 NOTE — Telephone Encounter (Signed)
Reaching out to patient to offer assistance regarding upcoming cardiac imaging study; pt verbalizes understanding of appt date/time, parking situation and where to check in, pre-test NPO status and medications ordered, and verified current allergies; name and call back number provided for further questions should they arise Marchia Bond RN Navigator Cardiac Imaging [facility] [phone] office [phone] cell   Spoke with patient and spoke with daughter who verbalized understanding Clarise Cruz

## 2020-02-02 ENCOUNTER — Ambulatory Visit (HOSPITAL_COMMUNITY)
Admission: RE | Admit: 2020-02-02 | Discharge: 2020-02-02 | Disposition: A | Discharge status: Home / Self Care | Payer: Medicare Other | Source: Ambulatory Visit | Attending: Cardiology | Admitting: Cardiology

## 2020-02-02 ENCOUNTER — Other Ambulatory Visit: Payer: Self-pay

## 2020-02-02 ENCOUNTER — Encounter (HOSPITAL_COMMUNITY): Payer: Self-pay

## 2020-02-02 DIAGNOSIS — R072 Precordial pain: Secondary | ICD-10-CM

## 2020-02-02 LAB — POCT I-STAT CREATININE: Creatinine, Ser: 1.2 mg/dL (ref 0.61–1.24)

## 2020-02-02 MED ORDER — NITROGLYCERIN 0.4 MG SL SUBL
0.8000 mg | SUBLINGUAL_TABLET | Freq: Once | SUBLINGUAL | Status: AC
  Administered 2020-02-02: 0.8 mg via SUBLINGUAL

## 2020-02-02 MED ORDER — IOHEXOL 350 MG/ML SOLN
100.0000 mL | Freq: Once | INTRAVENOUS | Status: AC | PRN
  Administered 2020-02-02: 100 mL via INTRAVENOUS

## 2020-02-02 MED ORDER — NITROGLYCERIN 0.4 MG SL SUBL
SUBLINGUAL_TABLET | SUBLINGUAL | Status: AC
  Filled 2020-02-02: qty 2

## 2020-02-02 NOTE — Progress Notes (Signed)
I-stat  Creatinine 1.2

## 2020-02-03 DIAGNOSIS — R072 Precordial pain: Secondary | ICD-10-CM | POA: Diagnosis not present

## 2020-03-09 ENCOUNTER — Ambulatory Visit (INDEPENDENT_AMBULATORY_CARE_PROVIDER_SITE_OTHER): Payer: Medicare Other | Admitting: Pharmacist

## 2020-03-09 ENCOUNTER — Other Ambulatory Visit: Payer: Self-pay

## 2020-03-09 VITALS — BP 108/72

## 2020-03-09 DIAGNOSIS — I209 Angina pectoris, unspecified: Secondary | ICD-10-CM

## 2020-03-09 DIAGNOSIS — E119 Type 2 diabetes mellitus without complications: Secondary | ICD-10-CM | POA: Diagnosis not present

## 2020-03-09 DIAGNOSIS — I10 Essential (primary) hypertension: Secondary | ICD-10-CM

## 2020-03-09 DIAGNOSIS — E785 Hyperlipidemia, unspecified: Secondary | ICD-10-CM

## 2020-03-09 DIAGNOSIS — M1000 Idiopathic gout, unspecified site: Secondary | ICD-10-CM

## 2020-03-09 MED ORDER — METOPROLOL SUCCINATE ER 25 MG PO TB24: 12.5000 mg | ORAL_TABLET | Freq: Every day | ORAL | 3 refills | Status: DC

## 2020-03-09 MED ORDER — LISINOPRIL 10 MG PO TABS
10.0000 mg | ORAL_TABLET | Freq: Every day | ORAL | 1 refills | Status: DC
Start: 2020-03-09 — End: 2020-08-27

## 2020-03-09 MED ORDER — METFORMIN HCL 500 MG PO TABS: 500.0000 mg | ORAL_TABLET | Freq: Two times a day (BID) | ORAL | 1 refills | Status: DC

## 2020-03-09 MED ORDER — ATORVASTATIN CALCIUM 40 MG PO TABS
40.0000 mg | ORAL_TABLET | Freq: Every day | ORAL | 1 refills | Status: DC
Start: 2020-03-09 — End: 2020-08-27

## 2020-03-09 MED ORDER — ALLOPURINOL 100 MG PO TABS: 150.0000 mg | ORAL_TABLET | Freq: Every day | ORAL | 2 refills | Status: DC

## 2020-03-09 NOTE — Progress Notes (Signed)
Patient ID: Patrick Marshall                 DOB: [birth year]                      MRN: [id number]     HPI: Patrick Marshall is a 74 y.o. male referred by Dr. Harrell Gave to pharmacist clinic. PMH includes hypertension, diabetes, chest pain,and dyspnea. 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 He presents today accompany by his youngest daughter. Report feeling well doesn't have a BP cuff at home, nor educated on how to use one. He is compliance with prescribed therapy but needs a com[plete MedRec to determine current therapy. (Visit conducted in Spanish - patient's prefer language)  Current HTN meds:  Lisinopril 10mg  daily Metoprolol succinate 12.5mg  daily (out of medication)  Current lipid management: Atorvastatin 40mg  daily (never picked up rosuvastatin)  BP goal: <130/80  LDL goal: < 100mg /dL  Family History: heart failure in father  Social History: occasional alcohol , former smoker  Exercise: still working in a farm  Home BP readings: none provided. NO BP cuff at home  Wt Readings from Last 3 Encounters:  01/13/20 235 lb (106.6 kg)  12/18/19 235 lb (106.6 kg)  12/31/18 235 lb (106.6 kg)   BP Readings from Last 3 Encounters:  03/09/20 108/72  02/02/20 (!) 96/58  01/13/20 128/80   Pulse Readings from Last 3 Encounters:  02/02/20 83  01/13/20 80  12/18/19 96    Renal function: CrCl cannot be calculated (Patient's most recent lab result is older than the maximum 21 days allowed.).  Past Medical History:  Diagnosis Date  . Diabetes mellitus without complication ([place])    x few years.  . Hypertension     Current Outpatient Medications on File Prior to Visit  Medication Sig Dispense Refill  . aspirin EC 81 MG tablet Take 81 mg by mouth daily. Swallow whole.    . nitroGLYCERIN (NITROSTAT) 0.4 MG SL tablet Place 1 tablet (0.4 mg total) under the tongue every 5 (five) minutes as needed for chest pain. 50 tablet 3   No current  facility-administered medications on file prior to visit.    Allergies  Allergen Reactions  . Penicillins Other (See Comments)    Difficulty sleeping    Blood pressure 108/72.  Hypertension Blood pressure at goal today. Will continue lisinopril 10mg  daily and refill metoprolol succinate prescription.  BP cuff was provided during office visit. I taught patient and daughter how to use it and verify BP cuff is accurate. Patient is to monitor BP daily and bring records to next OV.   Will follow up in 3 months to assure therapy continuation.  Hyperlipidemia LDL at goal with LDL = 44mg /dL in September/2021. Blood work was not done while fasting; therefore, will not address elevated TG at this time. Plan to repeat fasting lipid panel in 3-4 months. Patient educated in lowering simple sugar in diet to help with TG and glucose control.   Patrick Marshall PharmD, BCPS, [place] [address] [place] [address] 03/17/2020 4:12 PM

## 2020-03-09 NOTE — Patient Instructions (Addendum)
Proxima visita: 3 meses  No cambion en medicamento     Diabetes mellitus y nutricin, en adultos Diabetes Mellitus and Nutrition, Adult Si sufre de diabetes (diabetes mellitus), es muy importante tener hbitos alimenticios saludables debido a que sus niveles de Designer, television/film set sangre (glucosa) se ven afectados en gran medida por lo que come y bebe. [place] alimentos saludables en las cantidades [place], aproximadamente a la [employer], [place] ayudar a:  Aeronautical engineer glucemia.  Disminuir el riesgo de sufrir una enfermedad cardaca.  Mejorar la presin arterial.  Science writer o mantener un peso saludable. Todas las personas que sufren de diabetes son diferentes y cada una tiene necesidades diferentes en cuanto a un plan de alimentacin. El mdico puede recomendarle que trabaje con un especialista en dietas y nutricin (nutricionista) para Financial trader plan para usted. Su plan de alimentacin puede variar segn factores como:  Las caloras que necesita.  Los medicamentos que toma.  Su peso.  Sus niveles de glucemia, presin arterial y colesterol.  Su nivel de [place].  Otras afecciones que tenga, como enfermedades cardacas o renales. Cmo me afectan los carbohidratos? Los carbohidratos, o hidratos de carbono, afectan su nivel de glucemia ms que cualquier otro tipo de alimento. La ingesta de carbohidratos naturalmente aumenta la cantidad de [employer]. El recuento de carbohidratos es un mtodo destinado a Catering manager un registro de la cantidad de carbohidratos que se consumen. El recuento de carbohidratos es importante para Theatre manager la glucemia a un nivel saludable, especialmente si utiliza insulina o toma determinados medicamentos por va oral para la diabetes. Es importante conocer la cantidad de carbohidratos que se pueden ingerir en cada comida sin correr Engineer, manufacturing. Esto es Psychologist, forensic. Su nutricionista puede ayudarlo a calcular la cantidad de  carbohidratos que debe ingerir en cada comida y en cada refrigerio. Entre los alimentos que contienen carbohidratos, se incluyen:  Pan, cereal, arroz, pastas y galletas.  Papas y maz.  Guisantes, frijoles y lentejas.  Leche y Estate agent.  Lambert Mody y [place].  Postres, como pasteles, galletas, helado y caramelos. Cmo me afecta el alcohol? El alcohol puede provocar disminuciones sbitas de la glucemia (hipoglucemia), especialmente si utiliza insulina o toma determinados medicamentos por va oral para la diabetes. La hipoglucemia es una afeccin potencialmente mortal. Los sntomas de la hipoglucemia (somnolencia, mareos y confusin) son similares a los sntomas de haber consumido demasiado alcohol. Si el mdico afirma que el alcohol es seguro para usted, [place] estas pautas:  Limite el consumo de alcohol a no ms de 88medida por da si es mujer y no est [place], y a 76medidas si es hombre. Una medida equivale a 12oz (341ml) de cerveza, 5oz (120ml) de vino o 1oz (79ml) de bebidas alcohlicas de alta graduacin.  No beba con el estmago vaco.  Mantngase hidratado bebiendo agua, refrescos dietticos o t helado sin azcar.  Tenga en cuenta que los refrescos comunes, los jugos y otras bebida para Optician, dispensing pueden contener mucha azcar y se deben contar como carbohidratos. Cules son algunos consejos para seguir este plan?  Leer las etiquetas de los alimentos  Comience por leer el tamao de la porcin en la "Informacin nutricional" en las etiquetas de los alimentos envasados y las bebidas. La cantidad de caloras, carbohidratos, grasas y otros nutrientes mencionados en la etiqueta se basan en una porcin del alimento. Muchos alimentos contienen ms de una porcin por envase.  Verifique la cantidad total de gramos (g) de carbohidratos totales en  una porcin. Puede calcular la cantidad de porciones de carbohidratos al dividir el total de carbohidratos por 15. Por ejemplo, si un alimento  tiene un total de 30g de carbohidratos, equivale a 2 porciones de carbohidratos.  Verifique la cantidad de gramos (g) de grasas saturadas y grasas trans en una porcin. Escoja alimentos que no contengan grasa o que tengan un bajo contenido.  Verifique la cantidad de miligramos (mg) de sal (sodio) en una porcin. La mayora de las personas deben limitar la ingesta de sodio total a menos de 2300mg  por Training and development officer.  Siempre consulte la informacin nutricional de los alimentos etiquetados como "con bajo contenido de grasa" o "sin grasa". Estos alimentos pueden tener un mayor contenido de Location manager agregada o carbohidratos refinados, y deben evitarse.  Hable con su nutricionista para identificar sus objetivos diarios en cuanto a los nutrientes mencionados en la etiqueta. Al ir de compras  Evite comprar alimentos procesados, enlatados o precocinados. Estos alimentos tienden a Special educational needs teacher mayor cantidad de [place], sodio y azcar agregada.  Compre en la zona exterior de la tienda de comestibles. Esta zona incluye frutas y verduras frescas, granos a granel, carnes frescas y productos lcteos frescos. Al cocinar  Utilice mtodos de coccin a baja temperatura, como hornear, en lugar de mtodos de coccin a alta temperatura, como frer en abundante aceite.  Cocine con aceites saludables, como el aceite de [place], canola o [place].  Evite cocinar con manteca, crema o carnes con alto contenido de grasa. Planificacin de las comidas  Coma las comidas y los refrigerios regularmente, preferentemente a la misma hora todos [place]. Evite pasar largos perodos de tiempo sin comer.  Consuma alimentos ricos en fibra, como frutas frescas, verduras, frijoles y cereales integrales. Consulte a su nutricionista sobre cuntas porciones de carbohidratos puede consumir en cada comida.  Consuma entre 4 y 6 onzas (oz) de protenas magras por da, como carnes [place], pollo, pescado, huevos o tofu. Una onza de protena magra equivale  a: ? 1 onza de carne, pollo o pescado. ? 1huevo. ?  taza de tofu.  Coma algunos alimentos por da que contengan grasas saludables, como aguacates, frutos secos, semillas y pescado. Estilo de vida  Controle su nivel de glucemia con regularidad.  Haga actividad fsica habitualmente como se lo haya indicado el mdico. Esto puede incluir lo siguiente: ? 128minutos semanales de ejercicio de intensidad moderada o alta. Esto podra incluir caminatas dinmicas, ciclismo o gimnasia acutica. ? Realizar ejercicios de elongacin y de fortalecimiento, como yoga o levantamiento de pesas, por lo menos 2veces por semana.  Tome los [employer] se lo haya indicado el mdico.  No consuma ningn producto que contenga nicotina o tabaco, como cigarrillos y Psychologist, sport and exercise. Si necesita ayuda para dejar de fumar, consulte al [employer] con un asesor o instructor en diabetes para identificar estrategias para controlar el estrs y cualquier desafo emocional y social. Preguntas para hacerle al mdico  Es necesario que consulte a Radio broadcast assistant en el cuidado de la diabetes?  Es necesario que me rena con un nutricionista?  A qu nmero puedo llamar si tengo preguntas?  Cules son los mejores momentos para controlar la glucemia? Dnde encontrar ms informacin:  Asociacin Estadounidense de la Diabetes (American Diabetes Association): diabetes.org  Academia de Nutricin y Information systems manager (Academy of Nutrition and Dietetics): www.eatright.[place] Diabetes y las Enfermedades Digestivas y [facility] of Diabetes and Digestive and Kidney Diseases, NIH): [employer].dk Resumen  Un plan de alimentacin saludable  lo ayudar a Aeronautical engineer glucemia y Theatre manager un estilo de vida saludable.  Trabajar con un especialista en dietas y nutricin (nutricionista) puede ayudarlo a Insurance claims handler de alimentacin para usted.  Tenga en cuenta que los  carbohidratos (hidratos de carbono) y el alcohol tienen efectos inmediatos en sus niveles de glucemia. Es importante contar los carbohidratos que ingiere y consumir alcohol con prudencia. Esta informacin no tiene Marine scientist el consejo del mdico. Asegrese de hacerle al mdico cualquier pregunta que tenga. Document Revised: 11/21/2016 Document Reviewed: 07/03/2016 Elsevier Patient Education  [place].

## 2020-03-17 ENCOUNTER — Encounter: Payer: Self-pay | Admitting: Pharmacist

## 2020-03-17 NOTE — Assessment & Plan Note (Signed)
LDL at goal with LDL = 44mg /dL in September/2021. Blood work was not done while fasting; therefore, will not address elevated TG at this time. Plan to repeat fasting lipid panel in 3-4 months. Patient educated in lowering simple sugar in diet to help with TG and glucose control.

## 2020-03-17 NOTE — Assessment & Plan Note (Addendum)
Blood pressure at goal today. Will continue lisinopril 10mg  daily and refill metoprolol succinate prescription.  BP cuff was provided during office visit. I taught patient and daughter how to use it and verify BP cuff is accurate. Patient is to monitor BP daily and bring records to next OV.   Will follow up in 3 months to assure therapy continuation.

## 2020-05-04 DIAGNOSIS — H401132 Primary open-angle glaucoma, bilateral, moderate stage: Secondary | ICD-10-CM | POA: Diagnosis not present

## 2020-05-04 DIAGNOSIS — E113293 Type 2 diabetes mellitus with mild nonproliferative diabetic retinopathy without macular edema, bilateral: Secondary | ICD-10-CM | POA: Diagnosis not present

## 2020-05-04 DIAGNOSIS — Z961 Presence of intraocular lens: Secondary | ICD-10-CM | POA: Diagnosis not present

## 2020-05-04 DIAGNOSIS — H2511 Age-related nuclear cataract, right eye: Secondary | ICD-10-CM | POA: Diagnosis not present

## 2020-06-01 ENCOUNTER — Ambulatory Visit: Payer: Medicare Other

## 2020-08-27 ENCOUNTER — Ambulatory Visit (INDEPENDENT_AMBULATORY_CARE_PROVIDER_SITE_OTHER): Payer: Medicare Other | Admitting: Nurse Practitioner

## 2020-08-27 ENCOUNTER — Other Ambulatory Visit: Payer: Self-pay

## 2020-08-27 ENCOUNTER — Encounter: Payer: Self-pay | Admitting: Nurse Practitioner

## 2020-08-27 VITALS — BP 108/62 | HR 72 | Temp 98.1°F | Ht 63.0 in | Wt 232.0 lb

## 2020-08-27 DIAGNOSIS — E119 Type 2 diabetes mellitus without complications: Secondary | ICD-10-CM

## 2020-08-27 DIAGNOSIS — M1000 Idiopathic gout, unspecified site: Secondary | ICD-10-CM

## 2020-08-27 DIAGNOSIS — I209 Angina pectoris, unspecified: Secondary | ICD-10-CM

## 2020-08-27 DIAGNOSIS — I10 Essential (primary) hypertension: Secondary | ICD-10-CM

## 2020-08-27 DIAGNOSIS — E785 Hyperlipidemia, unspecified: Secondary | ICD-10-CM

## 2020-08-27 MED ORDER — ALLOPURINOL 100 MG PO TABS: 150.0000 mg | ORAL_TABLET | Freq: Every day | ORAL | 2 refills | Status: DC

## 2020-08-27 MED ORDER — METFORMIN HCL 500 MG PO TABS: 500.0000 mg | ORAL_TABLET | Freq: Two times a day (BID) | ORAL | 1 refills | Status: DC

## 2020-08-27 MED ORDER — METHYLPREDNISOLONE ACETATE 40 MG/ML IJ SUSP: 40.0000 mg | Freq: Once | INTRAMUSCULAR | Status: DC

## 2020-08-27 MED ORDER — PREDNISONE 10 MG PO TABS: 10.0000 mg | ORAL_TABLET | Freq: Every day | ORAL | 0 refills | Status: DC

## 2020-08-27 MED ORDER — ATORVASTATIN CALCIUM 40 MG PO TABS: 40.0000 mg | ORAL_TABLET | Freq: Every day | ORAL | 1 refills | Status: DC

## 2020-08-27 MED ORDER — METOPROLOL SUCCINATE ER 25 MG PO TB24: 12.5000 mg | ORAL_TABLET | Freq: Every day | ORAL | 3 refills | Status: DC

## 2020-08-27 MED ORDER — LISINOPRIL 10 MG PO TABS: 10.0000 mg | ORAL_TABLET | Freq: Every day | ORAL | 1 refills | Status: DC

## 2020-08-27 NOTE — Patient Instructions (Signed)
Plan de alimentacin con bajo contenido de purinas Low-Purine Eating Plan Un plan de alimentacin con bajo contenido de purinas implica elegir alimentos para limitar la ingesta de purinas. Las purinas son un tipo de cido rico. Si hay demasiado cido rico en la sangre, pueden manifestarse algunas afecciones, como gota y clculos renales. Consumir alimentos con bajo contenido de purinas puede ayudar a Government social research officer. Consejos para seguir Catering manager Al leer las etiquetas de los [employer] alimentos que contienen grasas saturadas o trans.  Leer la lista de ingredientes de los alimentos a base de granos, por ejemplo, panes y cereales, para verificar si contienen cereales integrales.  Leer la lista de ingredientes de salsas o sopas para asegurarse de que no contengan carne ni pescado.  Cuando se escojan refrescos, leer la lista de ingredientes para asegurarse de que no contengan jarabe de maz de alta fructosa. Al ir de compras  Compre mucha fruta y verdura fresca.  No compre pescado fresco ni enlatado.  Compre lcteos con bajo contenido de [place] o sin grasa.  Evite comprar alimentos procesados o prehechos. A menudo, contienen mucha grasa, sal (sodio) y azcar agregada.   Al cocinar  Para cocinar, use aceite de Tour manager de mantequilla. Los [employer] aceite de [place], canola y girasol contienen grasas saludables. Planificacin de las comidas  Aprenda qu alimentos son adecuados para usted y cules son perjudiciales. Si descubre que un alimento tiende a causarle una exacerbacin de la gota, evite comerlo. Puede disfrutar de los alimentos que no le causan problemas. Si tiene [place] pregunta sobre un alimento especfico, hable con el nutricionista o el mdico.  Limite los alimentos que contengan mucha grasa, especialmente grasas saturadas. La grasa hace que el organismo tenga ms dificultad para eliminar el cido rico.  Elija alimentos con bajo contenido de  grasa y fuentes de protenas magras. Pautas generales  Limite el consumo de alcohol a no ms de 59medida por da si es mujer y no est [place], y a 44medidas por da si es hombre. Una medida equivale a 12oz de Chartered certified accountant, 5oz de vino o 1oz de bebidas alcohlicas de alta graduacin. El alcohol puede alterar el modo en que el organismo elimina el cido rico.  [place] mucha agua para mantener la orina clara o de color amarillo plido. El lquido puede ayudar a Tour manager cido rico del cuerpo.  Si el mdico lo indica, tome un suplemento de vitaminaC.  Junto con el mdico y el nutricionista, diseen un plan que le permita alcanzar un peso saludable o [place]. Perder peso puede ayudar a reducir el cido rico en [employer]. Qu alimentos se recomiendan? Esta podra no ser [employer]. Hable con el nutricionista sobre las mejores opciones alimenticias para usted. Alimentos con bajo contenido de purinas No es Insurance underwriter el consumo de los alimentos con bajo contenido de purinas. Esto incluye lo siguiente:  [place].  Todas las verduras con bajo contenido de purinas, los pickles y las [place].  Panes, pastas, arroz, pan de maz y palomitas de maz. [place] y otros productos horneados.  Todos los lcteos.  Huevos, nueces y mantequillas de frutos secos.  Especias y condimentos, por ejemplo, sal, hierbas y vinagre.  Radene Gunning y aceites vegetales.  Grayce Sessions, refrescos sin azcar, t, caf y cacao.  Aderezos, salsas, caldos y sopas de verduras. Alimentos con contenido moderado de purinas Deben limitarse los alimentos con contenido moderado de purinas segn las cantidades indicadas.  Esprragos, coliflor,  espinacas, championes y guisantes (taza al da).  Avena cruda (2/3de taza al da).  Germen de trigo o salvado de trigo seco (de taza al da).  Carne de res o de ave (de 2a3onzas al da).  [place]iscos, por ejemplo, cangrejo, langosta, ostras o  camarones (de 4a6onzas al da).  Frijoles, guisantes o lentejas ([place]).  Sopas, caldos o consom preparados con carne o pescado. Limite estos alimentos lo ms posible. Qu alimentos no se recomiendan? Esta podra no ser [employer]. Hable con el nutricionista sobre las mejores opciones alimenticias para usted. Limite el consumo de alimentos con alto contenido de purinas, entre [place], los siguientes:  Chartered certified accountant y [place] bebidas alcohlicas.  Salsas preparadas con carne.  Pescado enlatado o fresco, por ejemplo: ? Anchoa, sardina, arenque y atn. ? Mejillones y vieiras. ? Dickie [place] y [place].  [place].  Vsceras, por ejemplo: ? Hgado o rin. ? [place]. ? Mollejas (glndula timo o pncreas).  Ganso o animales silvestres.  Levadura o suplementos con extracto de levadura.  Bebidas endulzadas con jarabe de maz de alta fructosa. Resumen  Consumir alimentos con bajo contenido de purinas puede ayudar a controlar afecciones causadas por la presencia excesiva de cido rico en el organismo, por ejemplo, gota o clculos renales.  Opte por alimentos con bajo contenido de purinas, y limite la ingesta de alcohol y de alimentos con [place].  Con el tiempo, aprender a [employer] lo afectan o no. Si descubre que un alimento tiende a causarle una exacerbacin de la gota, evite comerlo. Esta informacin no tiene Marine scientist el consejo del mdico. Asegrese de hacerle al mdico cualquier pregunta que tenga. Document Revised: 09/19/2019 Document Reviewed: 09/19/2019 Elsevier Patient Education  [place].

## 2020-08-27 NOTE — Progress Notes (Signed)
Subjective:    Patient ID: Patrick Marshall, male    DOB: 02-Jul-1945, 75 y.o.   MRN: [id number]  HPI: Patrick Marshall is a 75 y.o. male presenting for foot pain.  Chief Complaint  Patient presents with  . Gout    In both feet, having pain    FOOT PAIN Can't sleep at night due to the pain.  Jumps from foot to foot, a little in the right foot, severe pain currently in left big toe.  Small amount of pain in right great toe. Patient reports a history of gout and thinks this is a gout flare.  He has not been taking his allopurinol as prescribed. Duration: days Involved foot: left Mechanism of injury: thinks gout Location: left great toe Onset: sudden  Severity: severe  Quality: sharp Frequency: constant Radiation: no Aggravating factors: walking, wearing show  Alleviating factors: Tylenol, mild relief  Status: worse  Treatments attempted: Tylenol  Relief with NSAIDs?:  mild Weakness with weight bearing or walking: no Morning stiffness: no Swelling: yes Redness: yes Bruising: no Paresthesias / decreased sensation: no  Fevers:no   Patient is also due for blood work.  He has a history of hypertension, diabetes, and high cholesterol.  He is not fasting today.  He denies any issues with chest pain, shortness of breath, or trouble breathing.   Allergies  Allergen Reactions  . Penicillins Other (See Comments)    Difficulty sleeping    Outpatient Encounter Medications as of 08/27/2020  Medication Sig  . aspirin EC 81 MG tablet Take 81 mg by mouth daily. Swallow whole.  . nitroGLYCERIN (NITROSTAT) 0.4 MG SL tablet Place 1 tablet (0.4 mg total) under the tongue every 5 (five) minutes as needed for chest pain.  . predniSONE (DELTASONE) 10 MG tablet Take 1 tablet (10 mg total) by mouth daily with breakfast. Take 40mg  on days 1-2. Take 30mg  on days 3-4. Take 20mg  on days 5-6. Take 10mg  on days 7-8. Take 5mg  on days 9-10, then stop.  . [DISCONTINUED] allopurinol (ZYLOPRIM) 100 MG  tablet Take 1.5 tablets (150 mg total) by mouth daily.  . [DISCONTINUED] atorvastatin (LIPITOR) 40 MG tablet Take 1 tablet (40 mg total) by mouth daily.  . [DISCONTINUED] lisinopril (ZESTRIL) 10 MG tablet Take 1 tablet (10 mg total) by mouth daily.  . [DISCONTINUED] metFORMIN (GLUCOPHAGE) 500 MG tablet Take 1 tablet (500 mg total) by mouth 2 (two) times daily.  . [DISCONTINUED] metoprolol succinate (TOPROL-XL) 25 MG 24 hr tablet Take 0.5 tablets (12.5 mg total) by mouth daily.  Marland Kitchen allopurinol (ZYLOPRIM) 100 MG tablet Take 1.5 tablets (150 mg total) by mouth daily.  Marland Kitchen atorvastatin (LIPITOR) 40 MG tablet Take 1 tablet (40 mg total) by mouth daily.  Marland Kitchen ketorolac (ACULAR) 0.5 % ophthalmic solution Place 1 drop into the left eye 2 (two) times daily.  Marland Kitchen latanoprost (XALATAN) 0.005 % ophthalmic solution Place 1 drop into the left eye daily.  Marland Kitchen lisinopril (ZESTRIL) 10 MG tablet Take 1 tablet (10 mg total) by mouth daily.  . metFORMIN (GLUCOPHAGE) 500 MG tablet Take 1 tablet (500 mg total) by mouth 2 (two) times daily.  . metoprolol succinate (TOPROL-XL) 25 MG 24 hr tablet Take 0.5 tablets (12.5 mg total) by mouth daily.   Facility-Administered Encounter Medications as of 08/27/2020  Medication  . methylPREDNISolone acetate (DEPO-MEDROL) injection 40 mg    Patient Active Problem List   Diagnosis Date Noted  . Combined forms of age-related cataract of left eye  03/14/2018  . Morbid obesity ([place]) 02/28/2018  . Class 3 severe obesity with body mass index (BMI) of 40.0 to 44.9 in adult ([place]) 02/28/2018  . Lower urinary tract symptoms (LUTS) 02/28/2018  . Dyslipidemia 01/28/2018  . SOB (shortness of breath) 01/28/2018  . Gout 12/12/2017  . Prostate cancer screening 12/12/2017  . Hyperlipidemia 03/12/2017  . Type 2 diabetes mellitus without complication, without long-term current use of insulin ([place]) 09/14/2016  . Hypertension 09/14/2016  . 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 Diabetes mellitus without complication ([place])    x few years.  . Hypertension     Relevant past medical, surgical, family and social history reviewed and updated as indicated. Interim medical history since our last visit reviewed.  Review of Systems Per HPI unless specifically indicated above     Objective:    BP 108/62   Pulse 72   Temp 98.1 F (36.7 C)   Ht 5\' 3"  (1.6 m)   Wt 232 lb (105.2 kg)   SpO2 99%   BMI 41.10 kg/m   Wt Readings from Last 3 Encounters:  08/27/20 232 lb (105.2 kg)  01/13/20 235 lb (106.6 kg)  12/18/19 235 lb (106.6 kg)    Physical Exam Vitals and nursing note reviewed.  Constitutional:      General: He is not in acute distress.    Appearance: Normal appearance. He is not toxic-appearing.  HENT:     Head: Normocephalic and atraumatic.  Eyes:     General: No scleral icterus.    Extraocular Movements: Extraocular movements intact.  Cardiovascular:     Rate and Rhythm: Normal rate and regular rhythm.     Heart sounds: Normal heart sounds. No murmur heard.   Pulmonary:     Effort: Pulmonary effort is normal. No respiratory distress.     Breath sounds: No wheezing, rhonchi or rales.  Abdominal:     Palpations: Abdomen is soft.  Musculoskeletal:     Cervical back: Normal range of motion.  Lymphadenopathy:     Cervical: No cervical adenopathy.  Skin:    General: Skin is warm and dry.     Coloration: Skin is not jaundiced or pale.     Findings: No erythema.  Neurological:     Mental Status: He is alert and oriented to person, place, and time.     Motor: No weakness.     Gait: Gait normal.  Psychiatric:        Mood and Affect: Mood normal.        Behavior: Behavior normal.        Thought Content: Thought content normal.        Judgment: Judgment normal.       Assessment & Plan:   Problem List Items Addressed This Visit      Cardiovascular and Mediastinum   Hypertension    Chronic.  Blood pressure at goal today  in office.  Continue lisinopril 10 mg daily for now with metoprolol succinate 12.5 mg twice daily.  We will check kidney function with electrolytes today and plan to follow-up in 3 months.      Relevant Medications   atorvastatin (LIPITOR) 40 MG tablet   lisinopril (ZESTRIL) 10 MG tablet   metoprolol succinate (TOPROL-XL) 25 MG 24 hr tablet   Other Relevant Orders   COMPLETE METABOLIC PANEL WITH GFR (Completed)     Endocrine   Type 2 diabetes mellitus without complication, without long-term  current use of insulin (HCC) - Primary    Chronic.  Has maintained on metformin 500 mg twice daily.  Last hemoglobin A1c in September 2021 was 7.0%.  Goal for this patient is less than 7%.  We will check A1c today and adjust medication pending results.  Follow-up 3 months.      Relevant Medications   atorvastatin (LIPITOR) 40 MG tablet   lisinopril (ZESTRIL) 10 MG tablet   metFORMIN (GLUCOPHAGE) 500 MG tablet   Other Relevant Orders   Hemoglobin A1c (Completed)     Other   Hyperlipidemia    Chronic.  Maintained on atorvastatin 40 mg daily.  We will check lipids today, although patient is not fasting.  We will continue atorvastatin 40 mg daily, continue collaboration with cardiology, and continue with goal LDL less than 70.  Follow-up 3 months.      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  Discussed at length taking allopurinol for prevention of gout-previous dosing 150 mg daily.  Pending uric acid level today, consider increasing allopurinol.  Depo Medrol 40 mg IM given today in office and plan to start prednisone taper tomorrow morning.  Follow-up 3 months.      Relevant Medications   methylPREDNISolone acetate (DEPO-MEDROL) injection 40 mg   predniSONE (DELTASONE) 10 MG tablet   allopurinol  (ZYLOPRIM) 100 MG tablet   Other Relevant Orders   Uric acid (Completed)    Other Visit Diagnoses    Controlled type 2 diabetes mellitus without complication, without long-term current use of insulin (HCC)       Relevant Medications   atorvastatin (LIPITOR) 40 MG tablet   lisinopril (ZESTRIL) 10 MG tablet   metFORMIN (GLUCOPHAGE) 500 MG tablet   Angina pectoris (HCC)       Relevant Medications   atorvastatin (LIPITOR) 40 MG tablet   lisinopril (ZESTRIL) 10 MG tablet   metoprolol succinate (TOPROL-XL) 25 MG 24 hr tablet       Follow up plan: Return in about 3 months (around 11/27/2020).

## 2020-08-28 LAB — URIC ACID: Uric Acid, Serum: 6.7 mg/dL (ref 4.0–8.0)

## 2020-08-28 LAB — COMPLETE METABOLIC PANEL WITH GFR
AG Ratio: 1.6 (calc) (ref 1.0–2.5)
ALT: 22 U/L (ref 9–46)
AST: 16 U/L (ref 10–35)
Albumin: 4.2 g/dL (ref 3.6–5.1)
Alkaline phosphatase (APISO): 129 U/L (ref 35–144)
BUN: 20 mg/dL (ref 7–25)
CO2: 26 mmol/L (ref 20–32)
Calcium: 9.2 mg/dL (ref 8.6–10.3)
Chloride: 104 mmol/L (ref 98–110)
Creat: 1.17 mg/dL (ref 0.70–1.18)
GFR, Est African American: 71 mL/min/{1.73_m2} (ref >=60)
GFR, Est Non African American: 61 mL/min/{1.73_m2} (ref >=60)
Globulin: 2.6 g/dL (calc) (ref 1.9–3.7)
Glucose, Bld: 140 mg/dL — ABNORMAL HIGH (ref 65–99)
Potassium: 4.5 mmol/L (ref 3.5–5.3)
Sodium: 141 mmol/L (ref 135–146)
Total Bilirubin: 0.5 mg/dL (ref 0.2–1.2)
Total Protein: 6.8 g/dL (ref 6.1–8.1)

## 2020-08-28 LAB — HEMOGLOBIN A1C
Hgb A1c MFr Bld: 6.8 % of total Hgb — ABNORMAL HIGH (ref <5.7)
Mean Plasma Glucose: 148 mg/dL
eAG (mmol/L): 8.2 mmol/L

## 2020-08-28 LAB — LIPID PANEL
Cholesterol: 122 mg/dL (ref <200)
HDL: 45 mg/dL (ref >=40)
LDL Cholesterol (Calc): 45 mg/dL (calc)
Non-HDL Cholesterol (Calc): 77 mg/dL (calc) (ref <130)
Total CHOL/HDL Ratio: 2.7 (calc) (ref <5.0)
Triglycerides: 306 mg/dL — ABNORMAL HIGH (ref <150)

## 2020-08-31 NOTE — Assessment & Plan Note (Signed)
Chronic.  Blood pressure at goal today in office.  Continue lisinopril 10 mg daily for now with metoprolol succinate 12.5 mg twice daily.  We will check kidney function with electrolytes today and plan to follow-up in 3 months.

## 2020-08-31 NOTE — Assessment & Plan Note (Signed)
Chronic.  Maintained on atorvastatin 40 mg daily.  We will check lipids today, although patient is not fasting.  We will continue atorvastatin 40 mg daily, continue collaboration with cardiology, and continue with goal LDL less than 70.  Follow-up 3 months.

## 2020-08-31 NOTE — Assessment & Plan Note (Signed)
Chronic.  Has maintained on metformin 500 mg twice daily.  Last hemoglobin A1c in September 2021 was 7.0%.  Goal for this patient is less than 7%.  We will check A1c today and adjust medication pending results.  Follow-up 3 months.

## 2020-08-31 NOTE — Assessment & Plan Note (Signed)
Acute flare on chronic gout.  Discussed at length taking allopurinol for prevention of gout-previous dosing 150 mg daily.  Pending uric acid level today, consider increasing allopurinol.  Depo Medrol 40 mg IM given today in office and plan to start prednisone taper tomorrow morning.  Follow-up 3 months.

## 2020-09-10 ENCOUNTER — Telehealth: Payer: Self-pay | Admitting: Family Medicine

## 2020-09-10 NOTE — Telephone Encounter (Signed)
No answer unable to leave a message for patient to call back and schedule Medicare Annual Wellness Visit (AWV) in office.   If not able to come in office, please offer to do virtually or by telephone.   Last AWV:  02/28/2019  Please schedule at anytime with [facility]Nurse Health Advisor.  If any questions, please contact me at [phone]

## 2020-11-02 DIAGNOSIS — H2511 Age-related nuclear cataract, right eye: Secondary | ICD-10-CM | POA: Diagnosis not present

## 2020-11-02 DIAGNOSIS — Z961 Presence of intraocular lens: Secondary | ICD-10-CM | POA: Diagnosis not present

## 2020-11-02 DIAGNOSIS — H04122 Dry eye syndrome of left lacrimal gland: Secondary | ICD-10-CM | POA: Diagnosis not present

## 2020-11-02 DIAGNOSIS — H401132 Primary open-angle glaucoma, bilateral, moderate stage: Secondary | ICD-10-CM | POA: Diagnosis not present

## 2020-12-31 LAB — FECAL OCCULT BLOOD, GUAIAC: Fecal Occult Blood: NEGATIVE

## 2021-01-11 ENCOUNTER — Encounter: Payer: Self-pay | Admitting: *Deleted

## 2021-01-24 ENCOUNTER — Other Ambulatory Visit: Payer: Self-pay

## 2021-01-24 ENCOUNTER — Encounter: Payer: Self-pay | Admitting: Nurse Practitioner

## 2021-01-24 ENCOUNTER — Ambulatory Visit (INDEPENDENT_AMBULATORY_CARE_PROVIDER_SITE_OTHER): Payer: Medicare Other | Admitting: Nurse Practitioner

## 2021-01-24 VITALS — BP 128/82 | HR 73 | Temp 98.1°F | Resp 18 | Ht 63.0 in | Wt 224.0 lb

## 2021-01-24 DIAGNOSIS — K219 Gastro-esophageal reflux disease without esophagitis: Secondary | ICD-10-CM

## 2021-01-24 DIAGNOSIS — R0602 Shortness of breath: Secondary | ICD-10-CM | POA: Diagnosis not present

## 2021-01-24 MED ORDER — PANTOPRAZOLE SODIUM 40 MG PO TBEC: 40.0000 mg | DELAYED_RELEASE_TABLET | Freq: Every day | ORAL | 1 refills | Status: DC

## 2021-01-24 NOTE — Progress Notes (Signed)
Subjective:    Patient ID: Patrick Marshall, male    DOB: 1945/06/14, 75 y.o.   MRN: [id number]  HPI: Patrick Marshall is a 75 y.o. male presenting for burning in his chest and shortness of breath.  Chief Complaint  Patient presents with   Shortness of Breath   Chest Pain   Heartburn   GERD Patient reports burning in his chest worse after eating and when laying down at night to sleep Antacid use frequency:  daily Duration: hours  Nature: burning, gas Location: epigastrium up through chest Heartburn duration: hours Alleviatiating factors:  pepto bismol, alka seltzer Aggravating factors: eating, laying down Dysphagia: yes Odynophagia:  yes Hematemesis: no Blood in stool: no EGD: yes; reports in past "years ago"  SHORTNESS OF BREATH Duration: 1 month waxing and waning Onset: gradual Description of breathing discomfort: burning through his chest; makes it hard to breathe Severity: moderate Episode duration: hours Frequency: multiple times daily, worse after eating and when laying down at night Related to exertion: no Cough: no Chest tightness: yes Wheezing: no Fevers: no Chest pain: yes Palpitations: no  Nausea: no Diaphoresis: no Deconditioning: no Status: fluctuating Treatments attempted: nitroglycerin, pepto bismol, Alka seltzer  Allergies  Allergen Reactions   Penicillins Other (See Comments)    Difficulty sleeping    Outpatient Encounter Medications as of 01/24/2021  Medication Sig   allopurinol (ZYLOPRIM) 100 MG tablet Take 1.5 tablets (150 mg total) by mouth daily.   aspirin EC 81 MG tablet Take 81 mg by mouth daily. Swallow whole.   atorvastatin (LIPITOR) 40 MG tablet Take 1 tablet (40 mg total) by mouth daily.   ketorolac (ACULAR) 0.5 % ophthalmic solution Place 1 drop into the left eye 2 (two) times daily.   latanoprost (XALATAN) 0.005 % ophthalmic solution Place 1 drop into the left eye daily.   lisinopril (ZESTRIL) 10 MG tablet Take 1 tablet  (10 mg total) by mouth daily.   metFORMIN (GLUCOPHAGE) 500 MG tablet Take 1 tablet (500 mg total) by mouth 2 (two) times daily.   metoprolol succinate (TOPROL-XL) 25 MG 24 hr tablet Take 0.5 tablets (12.5 mg total) by mouth daily.   nitroGLYCERIN (NITROSTAT) 0.4 MG SL tablet Place 1 tablet (0.4 mg total) under the tongue every 5 (five) minutes as needed for chest pain.   pantoprazole (PROTONIX) 40 MG tablet Take 1 tablet (40 mg total) by mouth daily.   [DISCONTINUED] predniSONE (DELTASONE) 10 MG tablet Take 1 tablet (10 mg total) by mouth daily with breakfast. Take 40mg  on days 1-2. Take 30mg  on days 3-4. Take 20mg  on days 5-6. Take 10mg  on days 7-8. Take 5mg  on days 9-10, then stop.   [DISCONTINUED] methylPREDNISolone acetate (DEPO-MEDROL) injection 40 mg    No facility-administered encounter medications on file as of 01/24/2021.    Patient Active Problem List   Diagnosis Date Noted   Combined forms of age-related cataract of left eye 03/14/2018   Morbid obesity ([place]) 02/28/2018   Class 3 severe obesity with body mass index (BMI) of 40.0 to 44.9 in adult [facility]) 02/28/2018   Lower urinary tract symptoms (LUTS) 02/28/2018   Dyslipidemia 01/28/2018   SOB (shortness of breath) 01/28/2018   Gout 12/12/2017   Prostate cancer screening 12/12/2017   Hyperlipidemia 03/12/2017   Type 2 diabetes mellitus without complication, without long-term current use of insulin ([place]) 09/14/2016   Hypertension 09/14/2016   Pterygium eye, 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    Past Medical History:  Diagnosis Date  Diabetes mellitus without complication ([place])    x few years.   Hypertension     Relevant past medical, surgical, family and social history reviewed and updated as indicated. Interim medical history since our last visit reviewed.  Review of Systems Per HPI unless specifically indicated above     Objective:    BP 128/82 (BP Location: Left Arm, Patient Position: Sitting)   Pulse 73   Temp 98.1 F (36.7  C) (Oral)   Resp 18   Ht 5\' 3"  (1.6 m)   Wt 224 lb (101.6 kg)   SpO2 99%   BMI 39.68 kg/m   Wt Readings from Last 3 Encounters:  01/24/21 224 lb (101.6 kg)  08/27/20 232 lb (105.2 kg)  01/13/20 235 lb (106.6 kg)    Physical Exam Vitals and nursing note reviewed.  Constitutional:      General: He is not in acute distress.    Appearance: He is well-developed. He is obese. He is not toxic-appearing or diaphoretic.  HENT:     Head: Normocephalic and atraumatic.  Neck:     Thyroid: No thyromegaly.  Cardiovascular:     Rate and Rhythm: Normal rate and regular rhythm.  Pulmonary:     Effort: Pulmonary effort is normal. No accessory muscle usage or respiratory distress.     Breath sounds: Normal breath sounds. No decreased breath sounds, wheezing, rhonchi or rales.  Chest:     Chest wall: No tenderness.  Abdominal:     General: Bowel sounds are increased.     Palpations: Abdomen is soft. There is no mass.     Tenderness: There is abdominal tenderness in the epigastric area. There is no right CVA tenderness, left CVA tenderness, guarding or rebound. Negative signs include Murphy's sign.  Musculoskeletal:     Cervical back: Normal range of motion.     Right lower leg: No tenderness. No edema.     Left lower leg: No tenderness. No edema.  Lymphadenopathy:     Cervical: No cervical adenopathy.  Skin:    General: Skin is warm and dry.     Capillary Refill: Capillary refill takes less than 2 seconds.     Coloration: Skin is not cyanotic or pale.     Findings: No erythema.  Neurological:     Mental Status: He is alert and oriented to person, place, and time.  Psychiatric:        Mood and Affect: Mood normal. Mood is not anxious.        Behavior: Behavior normal. Behavior is not agitated.      Assessment & Plan:  1. Gastroesophageal reflux disease without esophagitis Symptoms are consistent with GERD.  Patient has an extensive history of CAD with angina; nitroglycerin has not  helped his symptoms and EKG today is normal, therefore I do not think his symptoms are cardiac in etiology.  Discussed foods to avoid with acid reflux and handout given.  Start daily PPI in the morning.  If no improvement in symptoms, we can consider referral to GI for possible upper GI.  - pantoprazole (PROTONIX) 40 MG tablet; Take 1 tablet (40 mg total) by mouth daily.  Dispense: 90 tablet; Refill: 1  2. Shortness of breath This is related to the epigastric pain and burning in his chest.  Treat with PPI as above.  EKG today is unchanged.  - EKG 12-Lead   Follow up plan: Return if symptoms worsen or fail to improve.

## 2021-02-02 ENCOUNTER — Ambulatory Visit (HOSPITAL_BASED_OUTPATIENT_CLINIC_OR_DEPARTMENT_OTHER): Payer: Medicare Other | Admitting: Family

## 2021-02-28 ENCOUNTER — Encounter: Payer: Self-pay | Admitting: Family Medicine

## 2021-02-28 ENCOUNTER — Ambulatory Visit (INDEPENDENT_AMBULATORY_CARE_PROVIDER_SITE_OTHER): Payer: Medicare Other | Admitting: Family Medicine

## 2021-02-28 ENCOUNTER — Other Ambulatory Visit: Payer: Self-pay

## 2021-02-28 VITALS — BP 118/78 | HR 73 | Temp 97.3°F | Resp 18 | Ht 63.0 in | Wt 228.0 lb

## 2021-02-28 DIAGNOSIS — E119 Type 2 diabetes mellitus without complications: Secondary | ICD-10-CM | POA: Diagnosis not present

## 2021-02-28 DIAGNOSIS — R748 Abnormal levels of other serum enzymes: Secondary | ICD-10-CM

## 2021-02-28 DIAGNOSIS — I10 Essential (primary) hypertension: Secondary | ICD-10-CM

## 2021-02-28 DIAGNOSIS — I2510 Atherosclerotic heart disease of native coronary artery without angina pectoris: Secondary | ICD-10-CM

## 2021-02-28 DIAGNOSIS — R1013 Epigastric pain: Secondary | ICD-10-CM

## 2021-02-28 DIAGNOSIS — E785 Hyperlipidemia, unspecified: Secondary | ICD-10-CM

## 2021-02-28 MED ORDER — SUCRALFATE 1 G PO TABS: 1.0000 g | ORAL_TABLET | Freq: Three times a day (TID) | ORAL | 1 refills | Status: DC

## 2021-02-28 NOTE — Progress Notes (Signed)
Subjective:    Patient ID: Patrick Marshall, male    DOB: 1946-01-05, 75 y.o.   MRN: [id number]  HPI  01/15/20 Patient is a very pleasant 75 year old Hispanic gentleman who presents today complaining of shortness of breath and dyspnea on exertion.  He states that for the last several months he has been coming increasingly short of breath.  It occurs with activity.  He is noticing it more frequently and with less activity now.  He also reports a tightness and a fullness in his chest with activity that improves with rest.  He denies any pain.  He denies any nausea or vomiting.  He does report a pressure-like sensation in his chest.  He denies any syncope or near syncope or palpitations.  He denies any pleurisy or cough or hemoptysis.  Patient had a stress test of his heart in 2019 that was normal.  He has not had blood work in more than a year to check his diabetes or his cholesterol or his blood counts.  He is taking a baby aspirin.  He denies any orthopnea or paroxysmal nocturnal dyspnea.  At that time, my plan was:  Patient had a normal exercise treadmill test 2 years ago however his symptoms sound concerning for angina.  I suspect that the patient has underlying coronary artery disease.  He certainly has risk factors including age, hypertension, and diabetes.  He is taking an aspirin 81 mg daily.  I will give the patient nitroglycerin 0.4 mg sublingual as needed chest pain.  We will start the patient back on a high intensity statin Crestor 20 mg a day.  We will also start the patient on a low-dose beta-blocker.  His blood pressure is relatively low and therefore we will need to be very careful with this.  I will start him on 25 mg tablets, he will take a half a tablet a day.  Consult cardiology as soon as possible as the patient requires evaluation for coronary artery disease.  EKG today is very reassuring.  Shows normal sinus rhythm with normal intervals and a normal axis with no evidence of ischemia  or infarction.  Patient does have ST segment depression in lead III and nonspecific ST changes in aVF which are nonspecific.  02/28/21 Had cardiac CT which showed non obstructive disease last year.  LAst A1c was 6.8 in June.  Patient reports chronic epigastric discomfort.  He states that every time he eats, he feels bloating.  He will feel gas and pressure buildup in his chest which makes him short of breath.  He will feel a pressure-like pain in his epigastric area.  This is made worse by food despite taking pantoprazole.  He denies any melena or hematochezia.  He denies any fevers or chills or constipation or weight loss.  It has been going on for months. Past Medical History:  Diagnosis Date   Diabetes mellitus without complication ([place])    x few years.   Hypertension    Past Surgical History:  Procedure Laterality Date   CHOLECYSTECTOMY     EYE SURGERY     2016 both eyes   Current Outpatient Medications on File Prior to Visit  Medication Sig Dispense Refill   allopurinol (ZYLOPRIM) 100 MG tablet Take 1.5 tablets (150 mg total) by mouth daily. 180 tablet 2   aspirin EC 81 MG tablet Take 81 mg by mouth daily. Swallow whole.     atorvastatin (LIPITOR) 40 MG tablet Take 1 tablet (40 mg total)  by mouth daily. 90 tablet 1   ketorolac (ACULAR) 0.5 % ophthalmic solution Place 1 drop into the left eye 2 (two) times daily.     latanoprost (XALATAN) 0.005 % ophthalmic solution Place 1 drop into the left eye daily.     lisinopril (ZESTRIL) 10 MG tablet Take 1 tablet (10 mg total) by mouth daily. 90 tablet 1   metFORMIN (GLUCOPHAGE) 500 MG tablet Take 1 tablet (500 mg total) by mouth 2 (two) times daily. 180 tablet 1   metoprolol succinate (TOPROL-XL) 25 MG 24 hr tablet Take 0.5 tablets (12.5 mg total) by mouth daily. 45 tablet 3   nitroGLYCERIN (NITROSTAT) 0.4 MG SL tablet Place 1 tablet (0.4 mg total) under the tongue every 5 (five) minutes as needed for chest pain. 50 tablet 3   pantoprazole  (PROTONIX) 40 MG tablet Take 1 tablet (40 mg total) by mouth daily. 90 tablet 1   No current facility-administered medications on file prior to visit.   Allergies  Allergen Reactions   Penicillins Other (See Comments)    Difficulty sleeping   Social History   Socioeconomic History   Marital status: Married    Spouse name: Not on file   Number of children: Not on file   Years of education: Not on file   Highest education level: Not on file  Occupational History   Occupation: Interior and spatial designer  Tobacco Use   Smoking status: Never   Smokeless tobacco: Never  Substance and Sexual Activity   Alcohol use: Yes    Comment: occasion   Drug use: No   Sexual activity: Not on file  Other Topics Concern   Not on file  Social History Narrative   Not on file   Social Determinants of Health   Financial Resource Strain: Not on file  Food Insecurity: Not on file  Transportation Needs: Not on file  Physical Activity: Not on file  Stress: Not on file  Social Connections: Not on file  Intimate Partner Violence: Not on file      Review of Systems  All other systems reviewed and are negative.     Objective:   Physical Exam Vitals reviewed.  Constitutional:      General: He is not in acute distress.    Appearance: Normal appearance. He is obese. He is not ill-appearing or toxic-appearing.  HENT:     Right Ear: Tympanic membrane and ear canal normal.     Left Ear: Tympanic membrane and ear canal normal.  Neck:     Vascular: No carotid bruit.  Cardiovascular:     Rate and Rhythm: Normal rate and regular rhythm.     Pulses: Normal pulses.     Heart sounds: Normal heart sounds. No murmur heard.   No friction rub. No gallop.  Pulmonary:     Effort: Pulmonary effort is normal. No respiratory distress.     Breath sounds: Normal breath sounds. No stridor. No wheezing, rhonchi or rales.  Chest:     Chest wall: No tenderness.  Abdominal:     General: Abdomen is flat. Bowel sounds are  normal. There is no distension.     Palpations: Abdomen is soft.     Tenderness: There is no abdominal tenderness. There is no guarding.  Musculoskeletal:     Right lower leg: No edema.     Left lower leg: No edema.  Neurological:     Mental Status: He is alert.          Assessment &  Plan:  Controlled type 2 diabetes mellitus without complication, without long-term current use of insulin ([place]) - Plan: CBC with Differential/Platelet, COMPLETE METABOLIC PANEL WITH GFR, Hemoglobin A1c, Lipid panel, Microalbumin, urine  Primary hypertension - Plan: CBC with Differential/Platelet, COMPLETE METABOLIC PANEL WITH GFR, Hemoglobin A1c, Lipid panel, Microalbumin, urine  Hyperlipidemia, unspecified hyperlipidemia type - Plan: CBC with Differential/Platelet, COMPLETE METABOLIC PANEL WITH GFR, Hemoglobin A1c, Lipid panel, Microalbumin, urine  ASCVD (arteriosclerotic cardiovascular disease) - Plan: CBC with Differential/Platelet, COMPLETE METABOLIC PANEL WITH GFR, Hemoglobin A1c, Lipid panel, Microalbumin, urine  Epigastric pain - Plan: Lipase I suspect gastritis.  I will check a lipase to rule out pancreatitis.  Start sucralfate 1 g 3 times daily with meals in addition to pantoprazole and then recheck the patient in 3 weeks.  Given his medical history, I will also check an A1c to monitor his diabetes along with urine microalbumin.  Given his history of coronary artery disease nonobstructive on coronary artery CT scan, I will check a lipid panel.  Goal LDL cholesterol is less than 100.  Blood pressure today is outstanding.  Reassess the patient in 3 weeks.  Decide between CT scan and EGD if pain persist

## 2021-03-01 ENCOUNTER — Other Ambulatory Visit: Payer: Self-pay

## 2021-03-01 ENCOUNTER — Encounter: Payer: Self-pay | Admitting: Family Medicine

## 2021-03-01 ENCOUNTER — Other Ambulatory Visit: Payer: Self-pay | Admitting: Family Medicine

## 2021-03-01 DIAGNOSIS — R748 Abnormal levels of other serum enzymes: Secondary | ICD-10-CM

## 2021-03-01 LAB — CBC WITH DIFFERENTIAL/PLATELET
Absolute Monocytes: 524 cells/uL (ref 200–950)
Basophils Absolute: 69 cells/uL (ref 0–200)
Basophils Relative: 1 %
Eosinophils Absolute: 359 cells/uL (ref 15–500)
Eosinophils Relative: 5.2 %
HCT: 45.5 % (ref 38.5–50.0)
Hemoglobin: 15.1 g/dL (ref 13.2–17.1)
Lymphs Abs: 2208 cells/uL (ref 850–3900)
MCH: 31.8 pg (ref 27.0–33.0)
MCHC: 33.2 g/dL (ref 32.0–36.0)
MCV: 95.8 fL (ref 80.0–100.0)
MPV: 11.6 fL (ref 7.5–12.5)
Monocytes Relative: 7.6 %
Neutro Abs: 3740 cells/uL (ref 1500–7800)
Neutrophils Relative %: 54.2 %
Platelets: 268 10*3/uL (ref 140–400)
RBC: 4.75 10*6/uL (ref 4.20–5.80)
RDW: 11.8 % (ref 11.0–15.0)
Total Lymphocyte: 32 %
WBC: 6.9 10*3/uL (ref 3.8–10.8)

## 2021-03-01 LAB — HEMOGLOBIN A1C
Hgb A1c MFr Bld: 7 % of total Hgb — ABNORMAL HIGH (ref <5.7)
Mean Plasma Glucose: 154 mg/dL
eAG (mmol/L): 8.5 mmol/L

## 2021-03-01 LAB — MICROALBUMIN, URINE: Microalb, Ur: 0.3 mg/dL

## 2021-03-01 LAB — TEST AUTHORIZATION

## 2021-03-01 LAB — LIPID PANEL
Cholesterol: 123 mg/dL (ref <200)
HDL: 52 mg/dL (ref >=40)
LDL Cholesterol (Calc): 51 mg/dL (calc)
Non-HDL Cholesterol (Calc): 71 mg/dL (calc) (ref <130)
Total CHOL/HDL Ratio: 2.4 (calc) (ref <5.0)
Triglycerides: 118 mg/dL (ref <150)

## 2021-03-01 LAB — COMPLETE METABOLIC PANEL WITH GFR
AG Ratio: 1.6 (calc) (ref 1.0–2.5)
ALT: 29 U/L (ref 9–46)
AST: 22 U/L (ref 10–35)
Albumin: 4.2 g/dL (ref 3.6–5.1)
Alkaline phosphatase (APISO): 267 U/L — ABNORMAL HIGH (ref 35–144)
BUN: 16 mg/dL (ref 7–25)
CO2: 31 mmol/L (ref 20–32)
Calcium: 9.8 mg/dL (ref 8.6–10.3)
Chloride: 99 mmol/L (ref 98–110)
Creat: 1.21 mg/dL (ref 0.70–1.28)
Globulin: 2.7 g/dL (calc) (ref 1.9–3.7)
Glucose, Bld: 161 mg/dL — ABNORMAL HIGH (ref 65–99)
Potassium: 4.6 mmol/L (ref 3.5–5.3)
Sodium: 138 mmol/L (ref 135–146)
Total Bilirubin: 0.7 mg/dL (ref 0.2–1.2)
Total Protein: 6.9 g/dL (ref 6.1–8.1)
eGFR: 63 mL/min/{1.73_m2} (ref >=60)

## 2021-03-01 LAB — GAMMA GT: GGT: 273 U/L — ABNORMAL HIGH (ref 3–70)

## 2021-03-01 LAB — LIPASE: Lipase: 19 U/L (ref 7–60)

## 2021-03-01 MED ORDER — SUCRALFATE 1 G PO TABS
1.0000 g | ORAL_TABLET | Freq: Three times a day (TID) | ORAL | 1 refills | Status: DC
Start: 2021-03-01 — End: 2021-10-18

## 2021-03-01 MED ORDER — KETOROLAC TROMETHAMINE 0.5 % OP SOLN
1.0000 [drp] | Freq: Two times a day (BID) | OPHTHALMIC | 0 refills | Status: DC
Start: 2021-03-01 — End: 2021-10-18

## 2021-03-01 MED ORDER — KETOROLAC TROMETHAMINE 0.5 % OP SOLN
1.0000 [drp] | Freq: Two times a day (BID) | OPHTHALMIC | 0 refills | Status: DC
Start: 2021-03-01 — End: 2021-03-01

## 2021-03-01 NOTE — Addendum Note (Signed)
Addended by: Pricilla Handler R on: 03/01/2021 11:09 AM   Modules accepted: Orders

## 2021-03-03 ENCOUNTER — Other Ambulatory Visit: Payer: Self-pay | Admitting: Family Medicine

## 2021-03-03 ENCOUNTER — Other Ambulatory Visit: Payer: Self-pay

## 2021-03-03 DIAGNOSIS — R748 Abnormal levels of other serum enzymes: Secondary | ICD-10-CM

## 2021-03-16 ENCOUNTER — Ambulatory Visit
Admission: RE | Admit: 2021-03-16 | Discharge: 2021-03-16 | Disposition: A | Discharge status: Home / Self Care | Payer: Medicare Other | Source: Ambulatory Visit | Attending: Family Medicine | Admitting: Family Medicine

## 2021-03-16 DIAGNOSIS — R748 Abnormal levels of other serum enzymes: Secondary | ICD-10-CM

## 2021-03-17 ENCOUNTER — Other Ambulatory Visit: Payer: Self-pay | Admitting: Family Medicine

## 2021-03-17 DIAGNOSIS — R160 Hepatomegaly, not elsewhere classified: Secondary | ICD-10-CM

## 2021-03-18 ENCOUNTER — Encounter: Payer: Self-pay | Admitting: Family Medicine

## 2021-03-18 ENCOUNTER — Other Ambulatory Visit: Payer: Self-pay

## 2021-03-18 ENCOUNTER — Ambulatory Visit (INDEPENDENT_AMBULATORY_CARE_PROVIDER_SITE_OTHER): Payer: Medicare Other | Admitting: Family Medicine

## 2021-03-18 VITALS — BP 118/78 | HR 71 | Temp 97.0°F | Resp 18 | Ht 63.0 in | Wt 227.0 lb

## 2021-03-18 DIAGNOSIS — R1013 Epigastric pain: Secondary | ICD-10-CM

## 2021-03-18 DIAGNOSIS — K219 Gastro-esophageal reflux disease without esophagitis: Secondary | ICD-10-CM | POA: Diagnosis not present

## 2021-03-18 DIAGNOSIS — R160 Hepatomegaly, not elsewhere classified: Secondary | ICD-10-CM

## 2021-03-18 DIAGNOSIS — R748 Abnormal levels of other serum enzymes: Secondary | ICD-10-CM

## 2021-03-18 NOTE — Progress Notes (Signed)
Subjective:    Patient ID: Patrick Marshall, male    DOB: [birth year], 75 y.o.   MRN: [id number]  HPI   02/28/21 Had cardiac CT which showed non obstructive disease last year.  LAst A1c was 6.8 in June.  Patient reports chronic epigastric discomfort.  He states that every time he eats, he feels bloating.  He will feel gas and pressure buildup in his chest which makes him short of breath.  He will feel a pressure-like pain in his epigastric area.  This is made worse by food despite taking pantoprazole.  He denies any melena or hematochezia.  He denies any fevers or chills or constipation or weight loss.  It has been going on for months.  At that time, my plan was:  I suspect gastritis.  I will check a lipase to rule out pancreatitis.  Start sucralfate 1 g 3 times daily with meals in addition to pantoprazole and then recheck the patient in 3 weeks.  Given his medical history, I will also check an A1c to monitor his diabetes along with urine microalbumin.  Given his history of coronary artery disease nonobstructive on coronary artery CT scan, I will check a lipid panel.  Goal LDL cholesterol is less than 100.  Blood pressure today is outstanding.  Reassess the patient in 3 weeks.  Decide between CT scan and EGD if pain persist  03/18/21 Patient continues to have abdominal bloating.  He continues to have nausea.  It gets worse every time he eats.  It is worse first thing in the morning.  He denies any melena or hematochezia.  However pantoprazole and sucralfate have not helped his symptoms at all.  Blood work showed an isolated elevation in his alkaline phosphatase.  An elevated GGT confirmed a liver source.  The remainder of his liver function tests were normal.  I obtained a right upper quadrant ultrasound:  IMPRESSION: 1. 1.6 cm hypoechoic indeterminate right hepatic lesion. Recommend further evaluation with MRI. 2. Heterogeneous liver echotexture may be related to diffuse hepatocellular disease.  Please correlate clinically. 3. Cholecystectomy.  I do not believe the liver lesion has anything to do with his symptoms however I am scheduling him for an MRI to better evaluate this.  My concern in the stomach would be gastroparesis versus gastritis.  I am also concerned by the isolated elevated alk phos.  Not sure if an MRCP is the next best course or an ERCP as I do believe the patient would benefit from an EGD as well. Past Medical History:  Diagnosis Date   Diabetes mellitus without complication ([place])    x few years.   Hypertension    Past Surgical History:  Procedure Laterality Date   CHOLECYSTECTOMY     EYE SURGERY     2016 both eyes   Current Outpatient Medications on File Prior to Visit  Medication Sig Dispense Refill   allopurinol (ZYLOPRIM) 100 MG tablet Take 1.5 tablets (150 mg total) by mouth daily. 180 tablet 2   aspirin EC 81 MG tablet Take 81 mg by mouth daily. Swallow whole.     atorvastatin (LIPITOR) 40 MG tablet Take 1 tablet (40 mg total) by mouth daily. 90 tablet 1   ketorolac (ACULAR) 0.5 % ophthalmic solution Place 1 drop into the left eye 2 (two) times daily. 5 mL 0   latanoprost (XALATAN) 0.005 % ophthalmic solution Place 1 drop into the left eye daily.     lisinopril (ZESTRIL) 10 MG tablet Take  1 tablet (10 mg total) by mouth daily. 90 tablet 1   metFORMIN (GLUCOPHAGE) 500 MG tablet Take 1 tablet (500 mg total) by mouth 2 (two) times daily. 180 tablet 1   metoprolol succinate (TOPROL-XL) 25 MG 24 hr tablet Take 0.5 tablets (12.5 mg total) by mouth daily. 45 tablet 3   nitroGLYCERIN (NITROSTAT) 0.4 MG SL tablet Place 1 tablet (0.4 mg total) under the tongue every 5 (five) minutes as needed for chest pain. 50 tablet 3   pantoprazole (PROTONIX) 40 MG tablet Take 1 tablet (40 mg total) by mouth daily. 90 tablet 1   sucralfate (CARAFATE) 1 g tablet Take 1 tablet (1 g total) by mouth 3 (three) times daily before meals. 90 tablet 1   No current facility-administered  medications on file prior to visit.   Allergies  Allergen Reactions   Penicillins Other (See Comments)    Difficulty sleeping   Social History   Socioeconomic History   Marital status: Married    Spouse name: Not on file   Number of children: Not on file   Years of education: Not on file   Highest education level: Not on file  Occupational History   Occupation: Interior and spatial designer  Tobacco Use   Smoking status: Never   Smokeless tobacco: Never  Substance and Sexual Activity   Alcohol use: Yes    Comment: occasion   Drug use: No   Sexual activity: Not on file  Other Topics Concern   Not on file  Social History Narrative   Not on file   Social Determinants of Health   Financial Resource Strain: Not on file  Food Insecurity: Not on file  Transportation Needs: Not on file  Physical Activity: Not on file  Stress: Not on file  Social Connections: Not on file  Intimate Partner Violence: Not on file      Review of Systems  All other systems reviewed and are negative.     Objective:   Physical Exam Vitals reviewed.  Constitutional:      General: He is not in acute distress.    Appearance: Normal appearance. He is obese. He is not ill-appearing or toxic-appearing.  HENT:     Right Ear: Tympanic membrane and ear canal normal.     Left Ear: Tympanic membrane and ear canal normal.  Neck:     Vascular: No carotid bruit.  Cardiovascular:     Rate and Rhythm: Normal rate and regular rhythm.     Pulses: Normal pulses.     Heart sounds: Normal heart sounds. No murmur heard.   No friction rub. No gallop.  Pulmonary:     Effort: Pulmonary effort is normal. No respiratory distress.     Breath sounds: Normal breath sounds. No stridor. No wheezing, rhonchi or rales.  Chest:     Chest wall: No tenderness.  Abdominal:     General: Abdomen is flat. Bowel sounds are normal. There is no distension.     Palpations: Abdomen is soft.     Tenderness: There is no abdominal tenderness.  There is no guarding.  Musculoskeletal:     Right lower leg: No edema.     Left lower leg: No edema.  Neurological:     Mental Status: He is alert.          Assessment & Plan:  Elevated alkaline phosphatase level  Liver mass  Gastroesophageal reflux disease without esophagitis  Epigastric pain I will schedule the patient for an MRI of the  liver to better evaluate the coincidental finding of 1.6 cm liver mass found on the right upper quadrant ultrasound.  However I believe that this is an incidental finding and has no bearing on his symptoms.  I suspect irritable bowel syndrome however I am concerned about the elevated alkaline phosphatase and whether or not there could be some type of pathology in the common bile duct not seen on the right upper quadrant ultrasound.  Hopefully the MRI will give [place] additional information about this area.  Also would recommend a GI consultation for an EGD.  Depending upon the results of the MRI then may suggest an MRCP versus an ERCP however I will await the results of the MRI and the GI consultation.  Gastroparesis is also in the differential diagnosis.

## 2021-03-23 ENCOUNTER — Other Ambulatory Visit: Payer: Medicare Other

## 2021-03-27 HISTORY — PX: UPPER GI ENDOSCOPY: SHX6162

## 2021-03-29 ENCOUNTER — Telehealth: Payer: Self-pay

## 2021-03-29 DIAGNOSIS — I10 Essential (primary) hypertension: Secondary | ICD-10-CM

## 2021-03-29 MED ORDER — LISINOPRIL 10 MG PO TABS: 10.0000 mg | ORAL_TABLET | Freq: Every day | ORAL | 1 refills | Status: DC

## 2021-03-29 NOTE — Telephone Encounter (Signed)
Sent to pharm ....

## 2021-03-29 NOTE — Telephone Encounter (Signed)
Pt came into office to request a refill of lisinopril (ZESTRIL) 10 MG tablet   Cb#: [id number]

## 2021-04-07 DIAGNOSIS — H524 Presbyopia: Secondary | ICD-10-CM | POA: Diagnosis not present

## 2021-04-08 ENCOUNTER — Telehealth: Payer: Self-pay | Admitting: Family Medicine

## 2021-04-08 NOTE — Telephone Encounter (Signed)
Received call from [place] from [place] to request prior auth for patient's MRI of liver; appointment scheduled for 1/18  Please advise at [phone].

## 2021-04-11 ENCOUNTER — Telehealth: Payer: Self-pay | Admitting: Family Medicine

## 2021-04-11 DIAGNOSIS — E785 Hyperlipidemia, unspecified: Secondary | ICD-10-CM

## 2021-04-11 DIAGNOSIS — M1000 Idiopathic gout, unspecified site: Secondary | ICD-10-CM

## 2021-04-11 MED ORDER — ATORVASTATIN CALCIUM 40 MG PO TABS: 40.0000 mg | ORAL_TABLET | Freq: Every day | ORAL | 3 refills | Status: DC

## 2021-04-11 MED ORDER — ALLOPURINOL 100 MG PO TABS: 150.0000 mg | ORAL_TABLET | Freq: Every day | ORAL | 3 refills | Status: DC

## 2021-04-11 NOTE — Telephone Encounter (Signed)
Rx's sent to pharmacy as requested. 

## 2021-04-11 NOTE — Telephone Encounter (Signed)
Patient came to office to request refills of the following medications  allopurinol (ZYLOPRIM) 100 MG tablet [[id number]]   atorvastatin (LIPITOR) 40 MG tablet [[id number]]    Last doses taken this morning.    Pharmacy confirmed as  [facility] [address] ([place]), [place] - [address]  [address] [facility] [place] [address]  Phone:  [phone]  Fax:  [phone]   Please advise at [phone]

## 2021-04-13 ENCOUNTER — Ambulatory Visit
Admission: RE | Admit: 2021-04-13 | Discharge: 2021-04-13 | Disposition: A | Discharge status: Home / Self Care | Payer: Medicare HMO | Source: Ambulatory Visit | Attending: Family Medicine | Admitting: Family Medicine

## 2021-04-13 ENCOUNTER — Other Ambulatory Visit: Payer: Self-pay

## 2021-04-13 DIAGNOSIS — N281 Cyst of kidney, acquired: Secondary | ICD-10-CM | POA: Diagnosis not present

## 2021-04-13 DIAGNOSIS — D1803 Hemangioma of intra-abdominal structures: Secondary | ICD-10-CM | POA: Diagnosis not present

## 2021-04-13 DIAGNOSIS — R160 Hepatomegaly, not elsewhere classified: Secondary | ICD-10-CM

## 2021-04-13 DIAGNOSIS — K769 Liver disease, unspecified: Secondary | ICD-10-CM | POA: Diagnosis not present

## 2021-04-13 DIAGNOSIS — Z9049 Acquired absence of other specified parts of digestive tract: Secondary | ICD-10-CM | POA: Diagnosis not present

## 2021-04-13 MED ORDER — GADOBENATE DIMEGLUMINE 529 MG/ML IV SOLN
20.0000 mL | Freq: Once | INTRAVENOUS | Status: AC | PRN
  Administered 2021-04-13: 20 mL via INTRAVENOUS

## 2021-04-22 ENCOUNTER — Telehealth: Payer: Self-pay | Admitting: Family Medicine

## 2021-04-22 ENCOUNTER — Encounter: Payer: Self-pay | Admitting: Family Medicine

## 2021-04-22 ENCOUNTER — Other Ambulatory Visit: Payer: Self-pay

## 2021-04-22 ENCOUNTER — Ambulatory Visit (INDEPENDENT_AMBULATORY_CARE_PROVIDER_SITE_OTHER): Payer: Medicare HMO | Admitting: Family Medicine

## 2021-04-22 VITALS — BP 104/72 | HR 81 | Temp 97.2°F | Resp 18 | Ht 63.0 in | Wt 227.0 lb

## 2021-04-22 DIAGNOSIS — R748 Abnormal levels of other serum enzymes: Secondary | ICD-10-CM | POA: Diagnosis not present

## 2021-04-22 DIAGNOSIS — Z6841 Body Mass Index (BMI) 40.0 and over, adult: Secondary | ICD-10-CM | POA: Diagnosis not present

## 2021-04-22 DIAGNOSIS — R1013 Epigastric pain: Secondary | ICD-10-CM

## 2021-04-22 MED ORDER — METOCLOPRAMIDE HCL 10 MG PO TABS: 10.0000 mg | ORAL_TABLET | Freq: Four times a day (QID) | ORAL | 1 refills | Status: DC | PRN

## 2021-04-22 NOTE — Progress Notes (Signed)
Subjective:    Patient ID: Patrick Marshall, male    DOB: Apr 29, 1945, 76 y.o.   MRN: [id number]  HPI   02/28/21 Had cardiac CT which showed non obstructive disease last year.  LAst A1c was 6.8 in June.  Patient reports chronic epigastric discomfort.  He states that every time he eats, he feels bloating.  He will feel gas and pressure buildup in his chest which makes him short of breath.  He will feel a pressure-like pain in his epigastric area.  This is made worse by food despite taking pantoprazole.  He denies any melena or hematochezia.  He denies any fevers or chills or constipation or weight loss.  It has been going on for months.  At that time, my plan was:  I suspect gastritis.  I will check a lipase to rule out pancreatitis.  Start sucralfate 1 g 3 times daily with meals in addition to pantoprazole and then recheck the patient in 3 weeks.  Given his medical history, I will also check an A1c to monitor his diabetes along with urine microalbumin.  Given his history of coronary artery disease nonobstructive on coronary artery CT scan, I will check a lipid panel.  Goal LDL cholesterol is less than 100.  Blood pressure today is outstanding.  Reassess the patient in 3 weeks.  Decide between CT scan and EGD if pain persist  03/18/21 Patient continues to have abdominal bloating.  He continues to have nausea.  It gets worse every time he eats.  It is worse first thing in the morning.  He denies any melena or hematochezia.  However pantoprazole and sucralfate have not helped his symptoms at all.  Blood work showed an isolated elevation in his alkaline phosphatase.  An elevated GGT confirmed a liver source.  The remainder of his liver function tests were normal.  I obtained a right upper quadrant ultrasound:  IMPRESSION: 1. 1.6 cm hypoechoic indeterminate right hepatic lesion. Recommend further evaluation with MRI. 2. Heterogeneous liver echotexture may be related to diffuse hepatocellular disease.  Please correlate clinically. 3. Cholecystectomy.  I do not believe the liver lesion has anything to do with his symptoms however I am scheduling him for an MRI to better evaluate this.  My concern in the stomach would be gastroparesis versus gastritis.  I am also concerned by the isolated elevated alk phos.  Not sure if an MRCP is the next best course or an ERCP as I do believe the patient would benefit from an EGD as well.  At that time, my plan was:  I will schedule the patient for an MRI of the liver to better evaluate the coincidental finding of 1.6 cm liver mass found on the right upper quadrant ultrasound.  However I believe that this is an incidental finding and has no bearing on his symptoms.  I suspect irritable bowel syndrome however I am concerned about the elevated alkaline phosphatase and whether or not there could be some type of pathology in the common bile duct not seen on the right upper quadrant ultrasound.  Hopefully the MRI will give [place] additional information about this area.  Also would recommend a GI consultation for an EGD.  Depending upon the results of the MRI then may suggest an MRCP versus an ERCP however I will await the results of the MRI and the GI consultation.  Gastroparesis is also in the differential diagnosis.  04/22/21 Patient states that he never heard from GI.  He has  not seen them yet.  We did perform an MRI of the liver.  Patient was found to have a benign hemangioma explaining abnormality on ultrasound.  No other abnormalities were identified.  He continues to report feeling nauseated.  He states that he feels like he is "hung over".  He also reports a burning epigastric pain.  Symptoms or not improving despite being on PPI and sucralfate.  He denies any melena or hematochezia. Past Medical History:  Diagnosis Date   Diabetes mellitus without complication ([place])    x few years.   Hypertension    Past Surgical History:  Procedure Laterality Date    CHOLECYSTECTOMY     EYE SURGERY     2016 both eyes   Current Outpatient Medications on File Prior to Visit  Medication Sig Dispense Refill   allopurinol (ZYLOPRIM) 100 MG tablet Take 1.5 tablets (150 mg total) by mouth daily. 180 tablet 3   aspirin EC 81 MG tablet Take 81 mg by mouth daily. Swallow whole.     atorvastatin (LIPITOR) 40 MG tablet Take 1 tablet (40 mg total) by mouth daily. 90 tablet 3   ketorolac (ACULAR) 0.5 % ophthalmic solution Place 1 drop into the left eye 2 (two) times daily. 5 mL 0   latanoprost (XALATAN) 0.005 % ophthalmic solution Place 1 drop into the left eye daily.     lisinopril (ZESTRIL) 10 MG tablet Take 1 tablet (10 mg total) by mouth daily. 90 tablet 1   metFORMIN (GLUCOPHAGE) 500 MG tablet Take 1 tablet (500 mg total) by mouth 2 (two) times daily. 180 tablet 1   metoprolol succinate (TOPROL-XL) 25 MG 24 hr tablet Take 0.5 tablets (12.5 mg total) by mouth daily. 45 tablet 3   nitroGLYCERIN (NITROSTAT) 0.4 MG SL tablet Place 1 tablet (0.4 mg total) under the tongue every 5 (five) minutes as needed for chest pain. 50 tablet 3   pantoprazole (PROTONIX) 40 MG tablet Take 1 tablet (40 mg total) by mouth daily. 90 tablet 1   sucralfate (CARAFATE) 1 g tablet Take 1 tablet (1 g total) by mouth 3 (three) times daily before meals. 90 tablet 1   No current facility-administered medications on file prior to visit.   Allergies  Allergen Reactions   Penicillins Other (See Comments)    Difficulty sleeping   Social History   Socioeconomic History   Marital status: Married    Spouse name: Not on file   Number of children: Not on file   Years of education: Not on file   Highest education level: Not on file  Occupational History   Occupation: Interior and spatial designer  Tobacco Use   Smoking status: Never   Smokeless tobacco: Never  Substance and Sexual Activity   Alcohol use: Yes    Comment: occasion   Drug use: No   Sexual activity: Not on file  Other Topics Concern   Not  on file  Social History Narrative   Not on file   Social Determinants of Health   Financial Resource Strain: Not on file  Food Insecurity: Not on file  Transportation Needs: Not on file  Physical Activity: Not on file  Stress: Not on file  Social Connections: Not on file  Intimate Partner Violence: Not on file      Review of Systems  All other systems reviewed and are negative.     Objective:   Physical Exam Vitals reviewed.  Constitutional:      General: He is not in acute  distress.    Appearance: Normal appearance. He is obese. He is not ill-appearing or toxic-appearing.  HENT:     Right Ear: Tympanic membrane and ear canal normal.     Left Ear: Tympanic membrane and ear canal normal.  Neck:     Vascular: No carotid bruit.  Cardiovascular:     Rate and Rhythm: Normal rate and regular rhythm.     Pulses: Normal pulses.     Heart sounds: Normal heart sounds. No murmur heard.   No friction rub. No gallop.  Pulmonary:     Effort: Pulmonary effort is normal. No respiratory distress.     Breath sounds: Normal breath sounds. No stridor. No wheezing, rhonchi or rales.  Chest:     Chest wall: No tenderness.  Abdominal:     General: Abdomen is flat. Bowel sounds are normal. There is no distension.     Palpations: Abdomen is soft.     Tenderness: There is no abdominal tenderness. There is no guarding.  Musculoskeletal:     Right lower leg: No edema.     Left lower leg: No edema.  Neurological:     Mental Status: He is alert.          Assessment & Plan:  Dyspepsia - Plan: Ambulatory referral to Gastroenterology  Elevated alkaline phosphatase level  Epigastric pain  I explained to the patient that he needs to see GI.  I feel that he would benefit from an EGD given the elevated alkaline phosphatase, I would question whether they would like to do an ERCP.  MRI was normal so I am less concerned although I cannot explain the elevated alkaline phosphatase reading.   Meanwhile I will try to treat the patient empirically for gastroparesis with Reglan 10 mg every 6 as needed given his history of diabetes.  However I stated that if symptoms do not improve in a week on the Reglan he must follow-up with GI in order to evaluate the symptoms further.  I confirmed his contact information.

## 2021-04-22 NOTE — Telephone Encounter (Signed)
Patient disputing outstanding bill from 11/22/20 for $75.25; patient was covered under [employer] at that time. Requesting for bill to be resubmitted to insurance.  Please advise at [phone].

## 2021-04-28 NOTE — Telephone Encounter (Signed)
Please have patient bring [employer] card by. The only insurance card we have in the system is Medicaid.  I can't resubmit to [facility] without card.

## 2021-05-02 NOTE — Telephone Encounter (Signed)
Rush Landmark was in name of daughter Roger Kettles; daughter paid bill in full today.

## 2021-07-11 ENCOUNTER — Encounter: Payer: Self-pay | Admitting: Nurse Practitioner

## 2021-07-23 ENCOUNTER — Other Ambulatory Visit: Payer: Self-pay | Admitting: Nurse Practitioner

## 2021-07-23 DIAGNOSIS — E119 Type 2 diabetes mellitus without complications: Secondary | ICD-10-CM

## 2021-07-25 NOTE — Telephone Encounter (Signed)
Requested Prescriptions  ?Pending Prescriptions Disp Refills  ?? metFORMIN (GLUCOPHAGE) 500 MG tablet [Pharmacy Med Name: metFORMIN HCl 500 MG Oral Tablet] 180 tablet 0  ?  Sig: Take 1 tablet by mouth twice daily  ?  ? Endocrinology:  Diabetes - Biguanides Failed - 07/23/2021 10:58 AM  ?  ?  Failed - B12 Level in normal range and within 720 days  ?  No results found for: VITAMINB12   ?  ?  Passed - Cr in normal range and within 360 days  ?  Creat  ?Date Value Ref Range Status  ?02/28/2021 1.21 0.70 - 1.28 mg/dL Final  ?   ?  ?  Passed - HBA1C is between 0 and 7.9 and within 180 days  ?  Hgb A1c MFr Bld  ?Date Value Ref Range Status  ?02/28/2021 7.0 (H) <5.7 % of total Hgb Final  ?  Comment:  ?  For someone without known diabetes, a hemoglobin A1c ?value of 6.5% or greater indicates that they may have  ?diabetes and this should be confirmed with a follow-up  ?test. ?. ?For someone with known diabetes, a value <7% indicates  ?that their diabetes is well controlled and a value  ?greater than or equal to 7% indicates suboptimal  ?control. A1c targets should be individualized based on  ?duration of diabetes, age, comorbid conditions, and  ?other considerations. ?. ?Currently, no consensus exists regarding use of ?hemoglobin A1c for diagnosis of diabetes for children. ?. ?  ?   ?  ?  Passed - eGFR in normal range and within 360 days  ?  GFR, Est African American  ?Date Value Ref Range Status  ?08/27/2020 71 > OR = 60 mL/min/1.45m Final  ? ?GFR, Est Non African American  ?Date Value Ref Range Status  ?08/27/2020 61 > OR = 60 mL/min/1.764mFinal  ? ?eGFR  ?Date Value Ref Range Status  ?02/28/2021 63 > OR = 60 mL/min/1.7378minal  ?  Comment:  ?  The eGFR is based on the CKD-EPI 2021 equation. To calculate  ?the new eGFR from a previous Creatinine or Cystatin C ?result, go to https://www.kidney.org/professionals/ ?kdoqi/gfr%5Fcalculator ?  ?   ?  ?  Passed - Valid encounter within last 6 months  ?  Recent Outpatient Visits    ?      ? 3 months ago [facility]  ? 4 months ago [facility] alkaline phosphatase [facility]  [facility]  ? 4 months ago Controlled type 2 diabetes mellitus without complication, without long-term current use of insulin ([place]? [facility]  ? 6 months ago Gastroesophageal reflux disease without esophagitis  ? [facility]  ? 11 months ago Type 2 diabetes mellitus without complication, without long-term current use of insulin ([facility]? [facility]  ?  ?  ? ?  ?  ?  Passed - CBC within normal limits and completed in the last 12 months  ?  WBC  ?Date Value Ref Range Status  ?02/28/2021 6.9 3.8 - 10.8 Thousand/uL Final  ? ?RBC  ?Date Value Ref Range Status  ?02/28/2021 4.75 4.20 - 5.80 Million/uL Final  ? ?Hemoglobin  ?Date Value Ref Range Status  ?02/28/2021 15.1 13.2 - 17.1 g/dL Final  ? ?HCT  ?Date Value Ref Range Status  ?  02/28/2021 45.5 38.5 - 50.0 % Final  ? ?MCHC  ?Date Value Ref Range Status  ?02/28/2021 33.2 32.0 - 36.0 g/dL Final  ? ?MCH  ?Date Value Ref Range Status  ?02/28/2021 31.8 27.0 - 33.0 pg Final  ? ?MCV  ?Date Value Ref Range Status  ?02/28/2021 95.8 80.0 - 100.0 fL Final  ? ?No results found for: PLTCOUNTKUC, LABPLAT, Elliott ?RDW  ?Date Value Ref Range Status  ?02/28/2021 11.8 11.0 - 15.0 % Final  ? ?  ?  ?  ? ?

## 2021-07-28 ENCOUNTER — Encounter: Payer: Self-pay | Admitting: Nurse Practitioner

## 2021-07-28 ENCOUNTER — Ambulatory Visit: Payer: Medicare HMO | Admitting: Nurse Practitioner

## 2021-07-28 VITALS — BP 98/64 | HR 73 | Ht 63.0 in | Wt 222.0 lb

## 2021-07-28 DIAGNOSIS — R1013 Epigastric pain: Secondary | ICD-10-CM

## 2021-07-28 DIAGNOSIS — R748 Abnormal levels of other serum enzymes: Secondary | ICD-10-CM | POA: Diagnosis not present

## 2021-07-28 NOTE — Patient Instructions (Addendum)
Ha sido programado para un EGD. Siga las instrucciones escritas que se le dieron en su visita de hoy. ?Si [place] inhaladores (aunque solo sea necesario), tr?igalos el d?a de su procedimiento. ? ?Contin?e tomando Pantoprazol 40 mg una vez al d?a. ? ??Gracias por confiar en m? para su cuidado gastrointestinal! ? ?Noralyn Pick, CRNP ? ?IMC: ? ? Si tiene 65 a?os o m?s, su ?ndice de masa corporal debe estar entre 62 y 11. Su ?ndice de masa corporal es de 39,33 kg/m?Marland Kitchen Si esto est? fuera del rango mencionado anteriormente, considere hacer un seguimiento con su proveedor de atenci?n primaria. ? ? Si tiene 34 a?os o menos, su ?ndice de masa corporal debe estar entre 32 y 80. Su ?ndice de masa corporal es de 39,33 kg/m?Marland Kitchen Si esto est? fuera del rango mencionado anteriormente, considere hacer un seguimiento con su proveedor de atenci?n primaria. ? ?MI CARTA: ? ?Los proveedores de Financial controller GI desean alentarlo a que use MYCHART para comunicarse con los proveedores para solicitudes o preguntas que no sean urgentes. Debido a los Astronomer de espera en el tel?fono, enviar un mensaje a su proveedor por [employer] puede ser una forma m?s r?pida y eficiente de Tax adviser. Espere 48 horas h?biles para obtener [employer]. Recuerde que esto es para solicitudes no urgentes. ? ? ?Enfermedad de reflujo gastroesof?gico en los adultos ?Gastroesophageal Reflux Disease, Adult ? ?El reflujo gastroesof?gico (RGE) ocurre cuando el ?cido del est?mago sube por el tubo que conecta la boca con el est?mago (es?fago). Normalmente, la comida baja por el es?fago y se mantiene en el est?mago, donde se la digiere. Cuando una persona tiene RGE, los alimentos y el ?cido estomacal suelen volver al es?fago. ?Usted puede tener una enfermedad llamada enfermedad de reflujo gastroesof?gico (ERGE) si el reflujo: ?Sucede a menudo. ?Causa s?ntomas frecuentes o muy intensos. ?Causa problemas tales como da?o en el es?fago. ?Cuando esto ocurre, el  es?fago duele y se hincha. Con el tiempo, la ERGE puede ocasionar peque?os agujeros (?lceras) en el revestimiento del es?fago. ??Cu?les son las causas? ?Esta afecci?n se debe a un problema en el m?sculo que se encuentra entre el es?fago y el est?mago. Cuando este m?sculo est? d?bil o no es normal, no se cierra correctamente para impedir que los alimentos y el ?cido regresen del est?mago. ?El m?sculo puede debilitarse debido a lo siguiente: ?El consumo de tabaco. ?Media planner. ?Tener cierto tipo de hernia (hernia de hiato). ?Consumo de alcohol. ?Ciertos alimentos y bebidas, como caf?, chocolate, cebollas y [place]. ??Qu? incrementa el riesgo? ?Tener sobrepeso. ?Tener una enfermedad que afecta el tejido conjuntivo. ?Tomar antiinflamatorios no esteroideos (AINE), como el ibuprofeno. ??Cu?les son los signos o s?ntomas? ?Acidez estomacal. ?Dificultad o dolor al tragar. ?Sensaci?n de tener un bulto en la garganta. ?Sabor amargo en la boca. ?Mal aliento. ?Tener una gran cantidad de saliva. ?Est?mago inflamado o con Tree surgeon. ?Eructos. ?Tourist information centre manager. El dolor de pecho puede deberse a distintas afecciones. Aseg?rese de consultar a su m?dico si tiene Tourist information centre manager. ?Dificultad para respirar o sibilancias. ?Ardelia Mems tos a largo plazo o tos nocturna. ?Desgaste de la superficie de los dientes (esmalte dental). ?P?rdida de peso. ??C?mo se trata? ?Realizar cambios en la dieta. ?Tomar medicamentos. ?Someterse a Neurosurgeon. ?El tratamiento depender? de la gravedad de los s?ntomas. ?Siga estas instrucciones en su casa: ?Comida y bebida ? ?Siga una dieta como se lo haya indicado el m?dico. Es posible que deba evitar alimentos y bebidas, por ejemplo: ?Caf? y t? negro, con  o sin cafe?na. ?Bebidas que contengan alcohol. ?Bebidas energ?ticas y deportivas. ?Bebidas gaseosas (carbonatadas) y refrescos. ?Chocolate y cacao. ?Menta y [place]. ?Ajo y cebolla. ?R?bano picante. ?Alimentos ?cidos y condimentados. Estos incluyen todos los  tipos de pimientos, [place] en polvo, curry en polvo, vinagre, salsas picantes y [employer]. ?C?tricos y sus jugos, por ejemplo, naranjas, limones y limas. ?Alimentos que [employer]. Estos incluyen salsa roja, [place], salsa picante y pizza con salsa de [place]. ?Alimentos fritos y Radio broadcast assistant. Estos incluyen donas, papas fritas, papitas fritas de bolsa y aderezos con alto contenido de [place]. ?Carnes con alto contenido de [place]. Estas incluye los perros calientes, chuletas o costillas, embutidos, jam?n y tocino. ?Productos l?cteos ricos en grasas, como leche [place], [place] y [place] crema. ?Consuma peque?as cantidades de comida con m?s frecuencia. Evite consumir porciones abundantes. ?Evite beber grandes cantidades de l?quidos con las comidas. ?Evite comer 2 o 3 horas antes de acostarse. ?Evite recostarse inmediatamente despu?s de comer. ?No haga ejercicios enseguida despu?s de comer. ?[place] ? ?No fume ni consuma ning?n producto que contenga nicotina o tabaco. Si necesita ayuda para dejar de consumir estos productos, consulte al m?dico. ?Intente reducir el nivel de estr?s. Si necesita ayuda para hacer esto, consulte al m?dico. ?Si tiene sobrepeso, baje una cantidad de peso saludable para usted. Consulte a su m?dico para bajar de peso de [place] segura. ?Instrucciones generales ?Est? atento a cualquier cambio en los s?ntomas. ?[employer] de venta libre y los recetados solamente como se lo haya indicado el m?dico. ?No tome aspirina, ibuprofeno ni otros antiinflamatorios no esteroideos (AINE) a menos que el m?dico lo autorice. ?Use ropa holgada. No use nada apretado alrededor de la cintura. ?Levante (eleve) la cabecera de la cama aproximadamente 6 pulgadas (15 cm). Para hacerlo, es posible que tenga que utilizar una cu?a. ?Evite inclinarse si al hacerlo empeoran los s?ntomas. ?Ruch. ?Comun?quese con un m?dico si: ?Aparecen nuevos s?ntomas. ?Adelgaza y no sabe por  qu?. ?Tiene problemas para tragar o le duele cuando traga. ?Tiene sibilancias o tos persistente. ?Tiene la voz ronca. ?Los s?ntomas no mejoran con Dispensing optician. ?Solicite ayuda de inmediato si: ?Siente dolor repentino [employer], el cuello, la mand?bula, los dientes o la espalda. ?De repente se siente transpirado, mareado o aturdido. ?Siente falta de aire o Tourist information centre manager. ?Vomita y el v?mito es de color verde, amar[place] o negro, o tiene un aspecto similar a la sangre o a los posos de caf?Marland Kitchen ?Se desmaya. ?Las heces (deposiciones) son rojas, sanguinolentas o negras. ?No puede tragar, beber o comer. ?Estos s?ntomas pueden representar un problema grave que constituye Engineer, maintenance (IT). No espere a ver si los s?ntomas desaparecen. Solicite atenci?n m?dica de inmediato. Comun?quese con el servicio de emergencias de su localidad (911 en los [place]). No conduzca por sus propios medios Principal Financial. ?Resumen ?Si una persona tiene enfermedad de reflujo gastroesof?gico (ERGE), los alimentos y el ?cido estomacal suben al es?fago y causan s?ntomas o problemas tales como da?o en el es?fago. ?El tratamiento depender? de la gravedad de los s?ntomas. ?Siga una dieta como se lo haya indicado el m?dico. ?Use todos los medicamentos solamente como se lo haya indicado el m?dico. ?Esta informaci?n no tiene Marine scientist el consejo del m?dico. Aseg?rese de hacerle al m?dico cualquier pregunta que tenga. ?Document Revised: 10/24/2019 Document Reviewed: 10/24/2019 ?Elsevier Patient Education ? [place]. ? ?

## 2021-07-28 NOTE — Progress Notes (Signed)
? ? ? ?07/28/2021 ?LOYE VENTO ?[id number] ?January 18, 1946 ? ? ?CHIEF COMPLAINT: Upper abdominal pain  ? ?HISTORY OF PRESENT ILLNESS: Patrick Marshall is 76 year old male with a past medical history of hypertension, DM II, hyperlipidemia, chronic DOE and glaucoma. Past cholecystectomy and cataract surgery. He presents to out office today as referred by Dr. Dennard Schaumann for further evaluation regarding epigastric pain.  He speaks Spanish therefore he is accompanied by a [employer] Spanish interpreter as well as his daughter.  He complains of having epigastric pain which comes and goes and feels as if gas builds up into his esophagus, sometimes feels it is difficult to take a breath in.  He has daily heartburn since 11/2020.  He has infrequent dysphagia, describes food briefly gets stuck in his upper esophagus which passes after he drinks water.  He sometimes feels as if he has a ball in his throat.  He has some difficulty drinking water when he first gets up in the morning.  He is taking Pantoprazole 40 mg once daily.  He denies ever having an EGD.  He is passing normal formed brown bowel movement once daily.  He has occasional constipation.  He reported seeing blood on his stools for few days many years ago without recurrence.  He denies ever having a screening colonoscopy. ? ?He has a history of nonspecific chest pain with DOE dating back to 2019. Stress test in 2019  per Dr. Percival Spanish was unremarkable.  He was seen by cardiologist Dr. Harrell Gave 12/2019 for further evaluation regarding DOE.  At that time, his DOE was virtually unchanged when compared to his symptoms in 2019. A coronary CT  was done 02/02/2020 which showed at least mild nonobstructive CAD (CADRADS = 2).  ? ?He denies having any chest pain or shortness of breath at this time. ? ?He was found to have an elevated alk phos level of 267 and GGT levels 12/202. He underwent a RUQ sonogram as ordered by Dr. Cindi Carbon 03/16/2021 which showed a 1.6 cm right hepatic  lesion and heterogenous liver echotexture possibly due to hepatocellular disease.  He subsequently underwent an abdominal MRI with contrast 04/13/2021 and the 1.2 cm right hepatic liver lesion was identified as a benign hemangioma.  No evidence of biliary dilatation. ? ? ? ?  Latest Ref Rng & Units 02/28/2021  ?  8:46 AM 12/18/2019  ? 12:23 PM 09/15/2016  ?  8:18 AM  ?CBC  ?WBC 3.8 - 10.8 Thousand/uL 6.9   6.9   6.6    ?Hemoglobin 13.2 - 17.1 g/dL 15.1   14.0   12.9    ?Hematocrit 38.5 - 50.0 % 45.5   42.4   40.0    ?Platelets 140 - 400 Thousand/uL 268   258   243    ?  ? ?  Latest Ref Rng & Units 02/28/2021  ?  8:46 AM 08/27/2020  ?  3:46 PM 02/02/2020  ? 12:05 PM  ?CMP  ?Glucose 65 - 99 mg/dL 161   140     ?BUN 7 - 25 mg/dL 16   20     ?Creatinine 0.70 - 1.28 mg/dL 1.21   1.17   1.20    ?Sodium 135 - 146 mmol/L 138   141     ?Potassium 3.5 - 5.3 mmol/L 4.6   4.5     ?Chloride 98 - 110 mmol/L 99   104     ?CO2 20 - 32 mmol/L 31   26     ?  Calcium 8.6 - 10.3 mg/dL 9.8   9.2     ?Total Protein 6.1 - 8.1 g/dL 6.9   6.8     ?Total Bilirubin 0.2 - 1.2 mg/dL 0.7   0.5     ?AST 10 - 35 U/L 22   16     ?ALT 9 - 46 U/L 29   22     ?  ?RUQ sono 03/16/2021: ?1. 1.6 cm hypoechoic indeterminate right hepatic lesion. Recommend ?further evaluation with MRI. ?2. Heterogeneous liver echotexture may be related to diffuse ?hepatocellular disease. Please correlate clinically. ?3. Cholecystectomy. ? ?Abdominal MRI/ 04/13/2021: ?1. 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 This lesion is benign and ?requires no further workup. ? ?Past Medical History:  ?Diagnosis Date  ? Cataract   ? Diabetes mellitus without complication ([place])   ? x few years.  ? GERD (gastroesophageal reflux disease)   ? Glaucoma   ? Gout   ? Heart disease   ? HLD (hyperlipidemia)   ? Hypertension   ? ?Past Surgical History:  ?Procedure Laterality Date  ? CHOLECYSTECTOMY    ? EYE SURGERY    ? 2016 both eyes  ? ?Social History: He is originally from [place] he is divorced. He  has 5 children, 4 living children. He stopped smoking cigarettes 30 years ago. He drinks one or two beers once monthly or less.  No drug use.  ? ?Family History: Mother with history of heart disease.  Father with history of heart failure. No known family history or esophageal, gastric or colon cancer.  ? ?Allergies  ?Allergen Reactions  ? Penicillins Other (See Comments)  ?  Difficulty sleeping  ? ? ?  ?Outpatient Encounter Medications as of 07/28/2021  ?Medication Sig  ? allopurinol (ZYLOPRIM) 100 MG tablet Take 1.5 tablets (150 mg total) by mouth daily.  ? aspirin EC 81 MG tablet Take 81 mg by mouth daily. Swallow whole.  ? atorvastatin (LIPITOR) 40 MG tablet Take 1 tablet (40 mg total) by mouth daily.  ? ketorolac (ACULAR) 0.5 % ophthalmic solution Place 1 drop into the left eye 2 (two) times daily.  ? latanoprost (XALATAN) 0.005 % ophthalmic solution Place 1 drop into the left eye daily.  ? lisinopril (ZESTRIL) 10 MG tablet Take 1 tablet (10 mg total) by mouth daily.  ? metFORMIN (GLUCOPHAGE) 500 MG tablet Take 1 tablet by mouth twice daily  ? metoCLOPramide (REGLAN) 10 MG tablet Take 1 tablet (10 mg total) by mouth every 6 (six) hours as needed for nausea. PLEASE LABEL IN [facility]  ? metoprolol succinate (TOPROL-XL) 25 MG 24 hr tablet Take 0.5 tablets (12.5 mg total) by mouth daily.  ? pantoprazole (PROTONIX) 40 MG tablet Take 1 tablet (40 mg total) by mouth daily.  ? sucralfate (CARAFATE) 1 g tablet Take 1 tablet (1 g total) by mouth 3 (three) times daily before meals.  ? nitroGLYCERIN (NITROSTAT) 0.4 MG SL tablet Place 1 tablet (0.4 mg total) under the tongue every 5 (five) minutes as needed for chest pain. (Patient not taking: Reported on 07/28/2021)  ? ?No facility-administered encounter medications on file as of 07/28/2021.  ? ? ?REVIEW OF SYSTEMS:  ?Gen: Denies fever, sweats or chills. No weight loss.  ?CV: Denies chest pain, palpitations or edema. ?Resp: Denies cough, shortness of breath of hemoptysis.   ?GI:See HPI.  ?GU : Denies urinary burning, blood in urine, increased urinary frequency or incontinence. ?MS: Denies joint pain, muscles aches or weakness. ?Derm: Denies rash, itchiness, skin  lesions or unhealing ulcers. ?Psych: Denies depression, anxiety or memory loss.  ?Heme: Denies bruising, easy bleeding. ?Neuro:  Denies headaches, dizziness or paresthesias. ?Endo:  Denies any problems with DM, thyroid or adrenal function. ? ?PHYSICAL EXAM: ?BP 98/64   Pulse 73   Ht 5' 3"  (1.6 m) Comment: height measured without shoes  Wt 222 lb (100.7 kg)   BMI 39.33 kg/m?  ?General: 75 year old obese male in no acute distress. ?Head: Normocephalic and atraumatic. ?Eyes:  Sclerae non-icteric, conjunctive pink. ?Ears: Normal auditory acuity. ?Mouth: Dentition intact. No ulcers or lesions.  ?Neck: Supple, no lymphadenopathy or thyromegaly.  ?Lungs: Clear bilaterally to auscultation without wheezes, crackles or rhonchi. ?Heart: Regular rate and rhythm. No murmur, rub or gallop appreciated.  ?Abdomen: Soft, nontender, non distended. No masses. No hepatosplenomegaly. Normoactive bowel sounds x 4 quadrants.  ?Rectal: Deferred.  ?Musculoskeletal: Symmetrical with no gross deformities. ?Skin: Warm and dry. No rash or lesions on visible extremities. ?Extremities: No edema. ?Neurological: Alert oriented x 4, no focal deficits.  ?Psychological:  Alert and cooperative. Normal mood and affect. ? ?ASSESSMENT AND PLAN: ? ?47) 76 year old male with epigastric pain, GERD and dysphagia.  ?-EGD benefits and risks discussed including risk with sedation, risk of bleeding, perforation and infection  ?-GERD Handout ?-Continue Pantoprazole 64m po QD ?-Patient to contact our office if his symptoms worsen ?-Further recommendations to be determined after EGD completed ? ?2) Nonobstructive CAD per coronary CT. No CP.  ? ?3) Chronic DOE ? ?4) Liver hemangioma per abdominal MRI ?-No further work-up required regarding a benign hemangioma ? ?5) Elevated  Alk phos level, elevated GGT level 02/2021. Abd MRI 04/13/2021 showed a right liver hemangioma, no evidence of biliary ductal dilatation.  ?-Hepatic panel and  GGT level  ? ?6) Colon cancer screening. FOBT 12/2020

## 2021-07-28 NOTE — Progress Notes (Signed)
Reviewed and agree with management plans. Please place on a cancellation list in the event that an earlier appointment for endoscopic evaluation becomes available.  ? ?Tesslyn Baumert L. Tarri Glenn, MD, MPH  ?

## 2021-07-29 ENCOUNTER — Telehealth: Payer: Self-pay

## 2021-07-29 NOTE — Telephone Encounter (Signed)
Spoke with patient's daughter & she will have patient come in for lab work soon.  ?

## 2021-07-29 NOTE — Telephone Encounter (Signed)
-----   Message from Noralyn Pick, NP sent at 07/28/2021  4:51 PM EDT ----- ?Ammie, I decided after the patient's office visit that I should check a hepatic panel and GGT level which was previously done by his PCP. ? ?He is Spanish-speaking, please have a [place] interpreter or [place] speaking staff member contact the patient or his daughter and send him to our lab to have a hepatic panel in the next week at his earliest convenience.  I put the lab order in.  Thank you ? ?

## 2021-09-13 ENCOUNTER — Telehealth: Payer: Self-pay

## 2021-09-13 ENCOUNTER — Encounter: Payer: Self-pay | Admitting: Gastroenterology

## 2021-09-13 ENCOUNTER — Ambulatory Visit (AMBULATORY_SURGERY_CENTER): Payer: Medicare HMO | Admitting: Gastroenterology

## 2021-09-13 VITALS — BP 111/87 | HR 58 | Temp 98.4°F | Resp 15 | Ht 63.0 in | Wt 227.0 lb

## 2021-09-13 DIAGNOSIS — Z6841 Body Mass Index (BMI) 40.0 and over, adult: Secondary | ICD-10-CM | POA: Diagnosis not present

## 2021-09-13 DIAGNOSIS — K2950 Unspecified chronic gastritis without bleeding: Secondary | ICD-10-CM | POA: Diagnosis not present

## 2021-09-13 DIAGNOSIS — R12 Heartburn: Secondary | ICD-10-CM

## 2021-09-13 DIAGNOSIS — R1310 Dysphagia, unspecified: Secondary | ICD-10-CM

## 2021-09-13 DIAGNOSIS — R1013 Epigastric pain: Secondary | ICD-10-CM

## 2021-09-13 DIAGNOSIS — K31A0 Gastric intestinal metaplasia, unspecified: Secondary | ICD-10-CM

## 2021-09-13 DIAGNOSIS — K2970 Gastritis, unspecified, without bleeding: Secondary | ICD-10-CM

## 2021-09-13 DIAGNOSIS — K2270 Barrett's esophagus without dysplasia: Secondary | ICD-10-CM | POA: Diagnosis not present

## 2021-09-13 DIAGNOSIS — K209 Esophagitis, unspecified without bleeding: Secondary | ICD-10-CM | POA: Diagnosis not present

## 2021-09-13 DIAGNOSIS — K2980 Duodenitis without bleeding: Secondary | ICD-10-CM | POA: Diagnosis not present

## 2021-09-13 MED ORDER — SODIUM CHLORIDE 0.9 % IV SOLN: 500.0000 mL | INTRAVENOUS | Status: DC

## 2021-09-13 MED ORDER — PANTOPRAZOLE SODIUM 40 MG PO TBEC: 40.0000 mg | DELAYED_RELEASE_TABLET | Freq: Two times a day (BID) | ORAL | 3 refills | Status: DC

## 2021-09-13 NOTE — Progress Notes (Signed)
Referring Provider: Susy Frizzle, MD Primary Care Physician:  Susy Frizzle, MD  Indication for Procedure:  epigastric pain, GERD and dysphagia   IMPRESSION:  Epigastric pain, GERD and dysphagia Appropriate candidate for monitored anesthesia care  PLAN: Upper endoscopy with possible dilation in the [place] today   HPI: Patrick Marshall is a 76 y.o. male presents for endoscopic evaluation of epigastric pain, GERD and dysphagia  He complains of having epigastric pain which comes and goes and feels as if gas builds up into his esophagus, sometimes feels it is difficult to take a breath in.  He has daily heartburn since 11/2020.  He has infrequent dysphagia, describes food briefly gets stuck in his upper esophagus which passes after he drinks water.  He sometimes feels as if he has a ball in his throat.  He has some difficulty drinking water when he first gets up in the morning.  He is taking Pantoprazole 40 mg once daily.  He denies ever having an EGD.     He has a history of nonspecific chest pain with DOE dating back to 2019. Stress test in 2019  per Dr. Percival Spanish was unremarkable.  He was seen by cardiologist Dr. Harrell Gave 12/2019 for further evaluation regarding DOE.  At that time, his DOE was virtually unchanged when compared to his symptoms in 2019. A coronary CT  was done 02/02/2020 which showed at least mild nonobstructive CAD (CADRADS = 2).    He denies having any chest pain or shortness of breath at this time.     Past Medical History:  Diagnosis Date   Cataract    Diabetes mellitus without complication ([place])    x few years.   GERD (gastroesophageal reflux disease)    Glaucoma    Gout    Heart disease    HLD (hyperlipidemia)    Hypertension     Past Surgical History:  Procedure Laterality Date   CHOLECYSTECTOMY     EYE SURGERY     2016 both eyes    Current Outpatient Medications  Medication Sig Dispense Refill   allopurinol (ZYLOPRIM) 100 MG tablet Take  1.5 tablets (150 mg total) by mouth daily. 180 tablet 3   aspirin EC 81 MG tablet Take 81 mg by mouth daily. Swallow whole.     atorvastatin (LIPITOR) 40 MG tablet Take 1 tablet (40 mg total) by mouth daily. 90 tablet 3   ketorolac (ACULAR) 0.5 % ophthalmic solution Place 1 drop into the left eye 2 (two) times daily. 5 mL 0   latanoprost (XALATAN) 0.005 % ophthalmic solution Place 1 drop into the left eye daily.     lisinopril (ZESTRIL) 10 MG tablet Take 1 tablet (10 mg total) by mouth daily. 90 tablet 1   metFORMIN (GLUCOPHAGE) 500 MG tablet Take 1 tablet by mouth twice daily 180 tablet 0   metoCLOPramide (REGLAN) 10 MG tablet Take 1 tablet (10 mg total) by mouth every 6 (six) hours as needed for nausea. PLEASE LABEL IN [facility] (Patient not taking: Reported on 09/13/2021) 90 tablet 1   metoprolol succinate (TOPROL-XL) 25 MG 24 hr tablet Take 0.5 tablets (12.5 mg total) by mouth daily. (Patient not taking: Reported on 09/13/2021) 45 tablet 3   nitroGLYCERIN (NITROSTAT) 0.4 MG SL tablet Place 1 tablet (0.4 mg total) under the tongue every 5 (five) minutes as needed for chest pain. (Patient not taking: Reported on 07/28/2021) 50 tablet 3   pantoprazole (PROTONIX) 40 MG tablet Take 1 tablet (40  mg total) by mouth daily. (Patient not taking: Reported on 09/13/2021) 90 tablet 1   sucralfate (CARAFATE) 1 g tablet Take 1 tablet (1 g total) by mouth 3 (three) times daily before meals. (Patient not taking: Reported on 09/13/2021) 90 tablet 1   Current Facility-Administered Medications  Medication Dose Route Frequency Provider Last Rate Last Admin   0.9 %  sodium chloride infusion  500 mL Intravenous Continuous Thornton Park, MD        Allergies as of 09/13/2021 - Review Complete 09/13/2021  Allergen Reaction Noted   Penicillins Other (See Comments) 01/27/2012    Family History  Problem Relation Age of Onset   Heart disease Mother    Heart failure Father         "Heart Surgery"  died age 41    Esophageal cancer Neg Hx      Physical Exam: General:   Alert,  well-nourished, pleasant and cooperative in NAD Head:  Normocephalic and atraumatic. Eyes:  Sclera clear, no icterus.   Conjunctiva pink. Mouth:  No deformity or lesions.   Neck:  Supple; no masses or thyromegaly. Lungs:  Clear throughout to auscultation.   No wheezes. Heart:  Regular rate and rhythm; no murmurs. Abdomen:  Soft, non-tender, nondistended, normal bowel sounds, no rebound or guarding.  Msk:  Symmetrical. No boney deformities LAD: No inguinal or umbilical LAD Extremities:  No clubbing or edema. Neurologic:  Alert and  oriented x4;  grossly nonfocal Skin:  No obvious rash or bruise. Psych:  Alert and cooperative. Normal mood and affect.     Studies/Results: No results found.    Lana Flaim L. Tarri Glenn, MD, MPH 09/13/2021, 10:02 AM

## 2021-09-13 NOTE — Progress Notes (Signed)
VSS, transported to PACU °

## 2021-09-13 NOTE — Telephone Encounter (Signed)
Patient has been scheduled for f/u with Jaclyn Shaggy, NP on 10/17/21 at 3:00 pm. Patient currently in [facility] recovering, will notify patient once he is home.

## 2021-09-13 NOTE — Telephone Encounter (Signed)
-----   Message from Thornton Park, MD sent at 09/13/2021 10:26 AM EDT ----- Office follow-up with [facility] in 3-4 weeks, please. Thank you.  KLB

## 2021-09-13 NOTE — Progress Notes (Signed)
Interpreter, [employer], present during recovery.

## 2021-09-13 NOTE — Patient Instructions (Signed)
Per Dr. Tarri Glenn ok to resume normal diet today.  No aspirin, ibuprofen, naproxen, or other non-steroidal anti inflammatory drugs. Protonix 40 mg twice daily for 8 weeks, then decrease to daily in am.    USTED TUVO UN PROCEDIMIENTO ENDOSCPICO HOY EN [facility]:   Lea el informe del procedimiento que se le entreg para cualquier pregunta especfica sobre lo que se Primary school teacher.  Si el informe del examen no responde a sus preguntas, por favor llame a su gastroenterlogo para aclararlo.  Si usted solicit que no se le den [employer] de lo que se Estate manager/land agent en su procedimiento al [employer] va a cuidar, entonces el informe del procedimiento se ha incluido en un sobre sellado para que usted lo revise despus cuando le sea ms conveniente.   LO QUE PUEDE ESPERAR: Algunas sensaciones de hinchazn en el abdomen.  Puede tener ms gases de lo normal.  El caminar puede ayudarle a eliminar el aire que se le puso en el tracto gastrointestinal durante el procedimiento y reducir la hinchazn.  Si le hicieron una endoscopia inferior (como una colonoscopia o una sigmoidoscopia flexible), podra notar manchas de sangre en las heces fecales o en el papel higinico.  Si se someti a una preparacin intestinal para su procedimiento, es posible que no tenga una evacuacin intestinal normal durante [employer].   Tenga en cuenta:  Es posible que note un poco de irritacin y congestin en la nariz o algn drenaje.  Esto es debido al oxgeno [employer] durante su procedimiento.  No hay que preocuparse y esto debe desaparecer ms o Scientist, research (medical).     Despus de la endoscopia superior (EGD)  Vmitos de Biochemist, clinical o material como caf molido   Dolor en el pecho o dolor debajo de los omplatos que antes no tena   Dolor o dificultad persistente para tragar  Falta de aire que antes no tena   Fiebre de 100F o ms  Heces fecales negras y pegajosas   Para asuntos urgentes o de Freight forwarder, puede  comunicarse con un gastroenterlogo a cualquier hora llamando al [phone].  DIETA:  Recomendamos una comida pequea al principio, pero luego puede continuar con su dieta normal.  Tome muchos lquidos, Teacher, adult education las bebidas alcohlicas durante 24 horas.    ACTIVIDAD:  Debe planear tomarse las cosas con calma por el resto del da y no debe CONDUCIR ni usar maquinaria pesada Programmer, applications (debido a los medicamentos de sedacin utilizados durante el examen).     SEGUIMIENTO: Nuestro personal llamar al nmero que aparece en su historial al siguiente da hbil de su procedimiento para ver cmo se siente y para responder cualquier pregunta o inquietud que pueda tener con respecto a la informacin que se le dio despus del procedimiento. Si no podemos contactarle, le dejaremos un mensaje.  Sin embargo, si se siente bien y no tiene Paediatric nurse, no es necesario que nos devuelva la llamada.  Asumiremos que ha regresado a sus actividades diarias normales sin incidentes. Si se le tomaron algunas biopsias, le contactaremos por telfono o por carta en las prximas 3 semanas.  Si no ha sabido [employer] biopsias en el transcurso de 3 semanas, por favor llmenos al [phone].   FIRMAS/CONFIDENCIALIDAD: Usted y/o el acompaante que le cuide han firmado documentos que se ingresarn en su historial mdico electrnico.  Estas firmas atestiguan el hecho de que la informacin anterior

## 2021-09-13 NOTE — Op Note (Signed)
[facility] Patient Name: Angelito Hopping Procedure Date: 09/13/2021 10:03 AM MRN: [id number] Endoscopist: Thornton Park MD, MD Age: 76 Referring MD:  Date of Birth: 1945/08/15 Gender: Male Account #: [id number] Procedure:                Upper GI endoscopy Indications:              Dysphagia, Heartburn, Epgiastric pain Medicines:                Monitored Anesthesia Care Procedure:                Pre-Anesthesia Assessment:                           - Prior to the procedure, a History and Physical                            was performed, and patient medications and                            allergies were reviewed. The patient's tolerance of                            previous anesthesia was also reviewed. The risks                            and benefits of the procedure and the sedation                            options and risks were discussed with the patient.                            All questions were answered, and informed consent                            was obtained. Prior Anticoagulants: The patient has                            taken no previous anticoagulant or antiplatelet                            agents. ASA Grade Assessment: III - A patient with                            severe systemic disease. After reviewing the risks                            and benefits, the patient was deemed in                            satisfactory condition to undergo the procedure.                           After obtaining informed consent, the endoscope was  passed under direct vision. Throughout the                            procedure, the patient's blood pressure, pulse, and                            oxygen saturations were monitored continuously. The                            Endoscope was introduced through the mouth, and                            advanced to the third part of duodenum. The upper                            GI  endoscopy was accomplished without difficulty.                            The patient tolerated the procedure well. Scope In: Scope Out: Findings:                 There is mild inflammation in the distal esophagus.                            No endoscopic abnormality was evident in the                            esophagus to explain the patient's complaint of                            dysphagia. It was decided, however, to proceed with                            dilation of the lower third of the esophagus. A TTS                            dilator was passed through the scope. Dilation with                            a 16-17-18 mm balloon dilator was performed to 18                            mm. The dilation site was examined and showed no                            change. After dilation, biopsies were obtained from                            the proximal and distal esophagus with cold forceps                            for histology of suspected eosinophilic  esophagitis. Estimated blood loss was minimal.                           Patchy minimal inflammation characterized by                            erythema was found in the gastric body. Biopsies                            were taken from the antrum, body, and fundus with a                            cold forceps for histology. Estimated blood loss                            was minimal.                           Diffuse mildly erythematous mucosa without active                            bleeding and with no stigmata of bleeding was found                            in the duodenal bulb. Biopsies were taken with a                            cold forceps for histology. Estimated blood loss                            was minimal. Complications:            No immediate complications. Estimated Blood Loss:     Estimated blood loss was minimal. Impression:               - Mild esophagitis but no other  endoscopic                            esophageal abnormality to explain patient's                            dysphagia. Esophagus dilated. Dilated. Biopsied.                           - Gastritis. Biopsied.                           - Erythematous duodenopathy. Biopsied. Recommendation:           - Patient has a contact number available for                            emergencies. The signs and symptoms of potential                            delayed complications were discussed with the  patient. Return to normal activities tomorrow.                            Written discharge instructions were provided to the                            patient.                           - Resume previous diet.                           - Continue present medications. Increase                            pantoprazole to 40 mg BID x 8 weeks, then reduce                            the dose back to 40 mg QAM.                           - Await pathology results.                           - No aspirin, ibuprofen, naproxen, or other                            non-steroidal anti-inflammatory drugs. Thornton Park MD, MD 09/13/2021 10:34:57 AM This report has been signed electronically.

## 2021-09-13 NOTE — Progress Notes (Signed)
Called to room to assist during endoscopic procedure.  Patient ID and intended procedure confirmed with present staff. Received instructions for my participation in the procedure from the performing physician.  

## 2021-09-14 ENCOUNTER — Telehealth: Payer: Self-pay

## 2021-09-14 NOTE — Telephone Encounter (Signed)
Patient's daughter has been made aware of follow up.

## 2021-09-14 NOTE — Telephone Encounter (Signed)
Left message

## 2021-09-28 ENCOUNTER — Encounter: Payer: Self-pay | Admitting: Gastroenterology

## 2021-10-02 ENCOUNTER — Other Ambulatory Visit: Payer: Self-pay | Admitting: Family Medicine

## 2021-10-02 DIAGNOSIS — I209 Angina pectoris, unspecified: Secondary | ICD-10-CM

## 2021-10-02 DIAGNOSIS — I10 Essential (primary) hypertension: Secondary | ICD-10-CM

## 2021-10-03 NOTE — Telephone Encounter (Signed)
Pt came in to office to request a courtesy refill of these meds to last until his scheduled ov of 7/25. Pt will be completely out of these meds before this scheduled appt.Please send refills to the [facility] at [facility].  lisinopril (ZESTRIL) 10 MG tablet [[id number]]  metoprolol succinate (TOPROL-XL) 25 MG 24 hr tablet [[id number]]   Cb#: [id number]

## 2021-10-03 NOTE — Telephone Encounter (Signed)
Requested Prescriptions  Pending Prescriptions Disp Refills  . lisinopril (ZESTRIL) 10 MG tablet [Pharmacy Med Name: Lisinopril 10 MG Oral Tablet] 90 tablet 0    Sig: Take 1 tablet by mouth once daily     Cardiovascular:  ACE Inhibitors Failed - 10/03/2021  2:52 PM      Failed - Cr in normal range and within 180 days    Creat  Date Value Ref Range Status  02/28/2021 1.21 0.70 - 1.28 mg/dL Final         Failed - K in normal range and within 180 days    Potassium  Date Value Ref Range Status  02/28/2021 4.6 3.5 - 5.3 mmol/L Final         Passed - Patient is not pregnant      Passed - Last BP in normal range    BP Readings from Last 1 Encounters:  09/13/21 111/87         Passed - Valid encounter within last 6 months    Recent Outpatient Visits          5 months ago Dyspepsia   [place] Susy Frizzle, MD   6 months ago Elevated alkaline phosphatase level   [place] Dennard Schaumann, Cammie Mcgee, MD   7 months ago Controlled type 2 diabetes mellitus without complication, without long-term current use of insulin ([place])   [place] Susy Frizzle, MD   8 months ago Gastroesophageal reflux disease without esophagitis   [place] Eulogio Bear, NP   1 year ago Type 2 diabetes mellitus without complication, without long-term current use of insulin ([place])   [place] Eulogio Bear, NP      Future Appointments            In 2 weeks Pickard, Cammie Mcgee, MD [place]

## 2021-10-03 NOTE — Addendum Note (Signed)
Addended by: Mliss Sax on: 10/03/2021 03:29 PM   Modules accepted: Orders

## 2021-10-03 NOTE — Telephone Encounter (Signed)
Requested medication (s) are due for refill today: expired medication  Requested medication (s) are on the active medication list: yes  Last refill:  08/27/20 #45 3 refills  Future visit scheduled: yes in 2 weeks   Notes to clinic:  expired medication. Do you want to give courtesy refill?     Requested Prescriptions  Pending Prescriptions Disp Refills   metoprolol succinate (TOPROL-XL) 25 MG 24 hr tablet 45 tablet 3    Sig: Take 0.5 tablets (12.5 mg total) by mouth daily.     Cardiovascular:  Beta Blockers Passed - 10/03/2021  3:29 PM      Passed - Last BP in normal range    BP Readings from Last 1 Encounters:  09/13/21 111/87         Passed - Last Heart Rate in normal range    Pulse Readings from Last 1 Encounters:  09/13/21 (!) 58         Passed - Valid encounter within last 6 months    Recent Outpatient Visits           5 months ago Dyspepsia   [place] Dennard Schaumann, Cammie Mcgee, MD   6 months ago Elevated alkaline phosphatase level   [place] Dennard Schaumann, Cammie Mcgee, MD   7 months ago Controlled type 2 diabetes mellitus without complication, without long-term current use of insulin ([place])   [place] Pickard, Cammie Mcgee, MD   8 months ago Gastroesophageal reflux disease without esophagitis   [place] Eulogio Bear, NP   1 year ago Type 2 diabetes mellitus without complication, without long-term current use of insulin ([place])   [place] Eulogio Bear, NP       Future Appointments             In 2 weeks [place] Schaumann, Cammie Mcgee, MD [place], [facility]            Signed Prescriptions Disp Refills   lisinopril (ZESTRIL) 10 MG tablet 90 tablet 0    Sig: Take 1 tablet by mouth once daily     Cardiovascular:  ACE Inhibitors Failed - 10/03/2021  2:52 PM      Failed - Cr in normal range and within 180 days    Creat  Date Value Ref Range Status  02/28/2021 1.21  0.70 - 1.28 mg/dL Final         Failed - K in normal range and within 180 days    Potassium  Date Value Ref Range Status  02/28/2021 4.6 3.5 - 5.3 mmol/L Final         Passed - Patient is not pregnant      Passed - Last BP in normal range    BP Readings from Last 1 Encounters:  09/13/21 111/87         Passed - Valid encounter within last 6 months    Recent Outpatient Visits           5 months ago Dyspepsia   [place] Susy Frizzle, MD   6 months ago Elevated alkaline phosphatase level   [place] Dennard Schaumann, Cammie Mcgee, MD   7 months ago Controlled type 2 diabetes mellitus without complication, without long-term current use of insulin ([place])   [place] Susy Frizzle, MD   8 months ago Gastroesophageal reflux disease without esophagitis   [place] Noemi Chapel  A, NP   1 year ago Type 2 diabetes mellitus without complication, without long-term current use of insulin ([place])   [place] Eulogio Bear, NP       Future Appointments             In 2 weeks Pickard, Cammie Mcgee, MD [place]

## 2021-10-08 ENCOUNTER — Other Ambulatory Visit: Payer: Self-pay | Admitting: Nurse Practitioner

## 2021-10-08 DIAGNOSIS — I209 Angina pectoris, unspecified: Secondary | ICD-10-CM

## 2021-10-10 ENCOUNTER — Other Ambulatory Visit: Payer: Self-pay

## 2021-10-10 DIAGNOSIS — I209 Angina pectoris, unspecified: Secondary | ICD-10-CM

## 2021-10-10 NOTE — Telephone Encounter (Signed)
Per Pt on 09/13/21 pt is not taking this medication. Is pt supposed to take this medication for his BP? Please advice

## 2021-10-10 NOTE — Telephone Encounter (Signed)
Requested medication (s) are due for refill today yes  Requested medication (s) are on the active medication list: yes  Last refill:  08/27/20  Future visit scheduled:yes  Notes to clinic:  Unable to refill per protocol, last refill by another provider. Routing for approval.     Requested Prescriptions  Pending Prescriptions Disp Refills   metoprolol succinate (TOPROL-XL) 25 MG 24 hr tablet [Pharmacy Med Name: Metoprolol Succinate ER 25 MG Oral Tablet Extended Release 24 Hour] 45 tablet 0    Sig: Take 1/2 (one-half) tablet by mouth once daily     Cardiovascular:  Beta Blockers Passed - 10/08/2021 10:02 AM      Passed - Last BP in normal range    BP Readings from Last 1 Encounters:  09/13/21 111/87         Passed - Last Heart Rate in normal range    Pulse Readings from Last 1 Encounters:  09/13/21 (!) 58         Passed - Valid encounter within last 6 months    Recent Outpatient Visits           5 months ago Dyspepsia   [place] Susy Frizzle, MD   6 months ago Elevated alkaline phosphatase level   [place] Pickard, Cammie Mcgee, MD   7 months ago Controlled type 2 diabetes mellitus without complication, without long-term current use of insulin ([place])   [place] Susy Frizzle, MD   8 months ago Gastroesophageal reflux disease without esophagitis   [place] Eulogio Bear, NP   1 year ago Type 2 diabetes mellitus without complication, without long-term current use of insulin ([place])   [place] Eulogio Bear, NP       Future Appointments             In 1 week Pickard, Cammie Mcgee, MD [place]

## 2021-10-10 NOTE — Telephone Encounter (Signed)
Pharmacy faxed a refill request for metoprolol succinate (TOPROL-XL) 25 MG 24 hr tablet [[id number]]    Order Details Dose: 12.5 mg Route: Oral Frequency: Daily  Dispense Quantity: 45 tablet Refills: 3   Note to Pharmacy: Label in spainish       Sig: Take 0.5 tablets (12.5 mg total) by mouth daily.  Patient not taking: Reported on 09/13/2021

## 2021-10-10 NOTE — Addendum Note (Signed)
Addended by: Colman Cater on: 10/10/2021 04:26 PM   Modules accepted: Orders

## 2021-10-16 ENCOUNTER — Emergency Department (HOSPITAL_COMMUNITY)
Admission: EM | Admit: 2021-10-16 | Discharge: 2021-10-16 | Discharge status: Against Medical Advice | Payer: Medicare HMO

## 2021-10-16 ENCOUNTER — Other Ambulatory Visit: Payer: Self-pay

## 2021-10-16 NOTE — ED Notes (Signed)
Patient called for triage x3 

## 2021-10-17 ENCOUNTER — Other Ambulatory Visit: Payer: Self-pay | Admitting: Nurse Practitioner

## 2021-10-17 ENCOUNTER — Ambulatory Visit: Payer: Medicare HMO | Admitting: Family Medicine

## 2021-10-17 ENCOUNTER — Encounter: Payer: Self-pay | Admitting: Nurse Practitioner

## 2021-10-17 ENCOUNTER — Ambulatory Visit: Payer: Medicare HMO | Admitting: Nurse Practitioner

## 2021-10-17 ENCOUNTER — Other Ambulatory Visit (INDEPENDENT_AMBULATORY_CARE_PROVIDER_SITE_OTHER): Payer: Medicare HMO

## 2021-10-17 VITALS — BP 122/80 | HR 92 | Ht 63.0 in | Wt 225.0 lb

## 2021-10-17 DIAGNOSIS — R0989 Other specified symptoms and signs involving the circulatory and respiratory systems: Secondary | ICD-10-CM

## 2021-10-17 DIAGNOSIS — I209 Angina pectoris, unspecified: Secondary | ICD-10-CM

## 2021-10-17 DIAGNOSIS — K219 Gastro-esophageal reflux disease without esophagitis: Secondary | ICD-10-CM

## 2021-10-17 DIAGNOSIS — R748 Abnormal levels of other serum enzymes: Secondary | ICD-10-CM

## 2021-10-17 LAB — GAMMA GT: GGT: 54 U/L — ABNORMAL HIGH (ref 7–51)

## 2021-10-17 LAB — HEPATIC FUNCTION PANEL
ALT: 24 U/L (ref 0–53)
AST: 22 U/L (ref 0–37)
Albumin: 4.5 g/dL (ref 3.5–5.2)
Alkaline Phosphatase: 126 U/L — ABNORMAL HIGH (ref 39–117)
Bilirubin, Direct: 0.1 mg/dL (ref 0.0–0.3)
Total Bilirubin: 0.3 mg/dL (ref 0.2–1.2)
Total Protein: 7.4 g/dL (ref 6.0–8.3)

## 2021-10-17 MED ORDER — FAMOTIDINE 20 MG PO TABS: 20.0000 mg | ORAL_TABLET | Freq: Every day | ORAL | 1 refills | Status: DC

## 2021-10-17 NOTE — Patient Instructions (Signed)
If you are age 75 or older, your body mass index should be between 23-30. Your Body mass index is 39.86 kg/m. If this is out of the aforementioned range listed, please consider follow up with your Primary Care Provider.  If you are age 86 or younger, your body mass index should be between 19-25. Your Body mass index is 39.86 kg/m. If this is out of the aformentioned range listed, please consider follow up with your Primary Care Provider.   ________________________________________________________  The [facility] providers would like to encourage you to use [facility] to communicate with providers for non-urgent requests or questions.  Due to long hold times on the telephone, sending your provider a message by [facility] may be a faster and more efficient way to get a response.  Please allow 48 business hours for a response.  Please remember that this is for non-urgent requests.  _______________________________________________________  Your provider has requested that you go to the basement level for lab work before leaving today. Press "B" on the elevator. The lab is located at the first door on the left as you exit the elevator.   1)  Continue Pantoprazole '40mg'$  take one capsule by mouth thirty minutes before breakfast and 30 minutes before dinner  2) If stomach burning persists, may add Famotidine '20mg'$  one tab at bed time  3) Please contract your PCP to see about an ENT referral for globus sensation  4) Our office will contact you to schedule a barium swallow   Please call mid august to see about scheduling a September follow up appointment.       You have been scheduled for a Barium Esophogram at [facility] (1st floor of the hospital) on   10-31-2021      at   1030am . Please arrive 30 minutes prior to your appointment for registration. Make certain not to have anything to eat or drink 3 hours prior to your test. If you need to reschedule for any reason, please contact radiology at  [phone] to do so. __________________________________________________________________ A barium swallow is an examination that concentrates on views of the esophagus. This tends to be a double contrast exam (barium and two liquids which, when combined, create a gas to distend the wall of the oesophagus) or single contrast (non-ionic iodine based). The study is usually tailored to your symptoms so a good history is essential. Attention is paid during the study to the form, structure and configuration of the esophagus, looking for functional disorders (such as aspiration, dysphagia, achalasia, motility and reflux) EXAMINATION You may be asked to change into a gown, depending on the type of swallow being performed. A radiologist and radiographer will perform the procedure. The radiologist will advise you of the type of contrast selected for your procedure and direct you during the exam. You will be asked to stand, sit or lie in several different positions and to hold a small amount of fluid in your mouth before being asked to swallow while the imaging is performed .In some instances you may be asked to swallow barium coated marshmallows to assess the motility of a solid food bolus. The exam can be recorded as a digital or video fluoroscopy procedure. POST PROCEDURE It will take 1-2 days for the barium to pass through your system. To facilitate this, it is important, unless otherwise directed, to increase your fluids for the next 24-48hrs and to resume your normal diet.  This test typically takes about 30 minutes to perform. __________________________________________________________________________________  Enfermedad de reflujo gastroesofgico en los adultos Gastroesophageal Reflux Disease, Adult  El reflujo gastroesofgico (RGE) ocurre cuando el cido del estmago sube por el tubo que conecta la boca con el estmago (esfago). Normalmente, la comida baja por el esfago y se mantiene en el estmago,  donde se la digiere. Cuando una persona tiene RGE, los alimentos y el cido estomacal suelen volver al esfago. Usted puede tener una enfermedad llamada enfermedad de reflujo gastroesofgico (ERGE) si el reflujo: Sucede a menudo. Causa sntomas frecuentes o muy intensos. Causa problemas tales como dao en el esfago. Cuando esto ocurre, el esfago duele y se hincha. Con el tiempo, la ERGE puede ocasionar pequeos agujeros (lceras) en el revestimiento del esfago. Cules son las causas? Esta afeccin se debe a un problema en el msculo que se encuentra entre el esfago y [place]. Cuando este msculo est dbil o no es normal, no se cierra correctamente para impedir que los alimentos y el cido regresen del Paramedic. El msculo puede debilitarse debido a lo siguiente: El consumo de [place]. [place]. Tener cierto tipo de hernia (hernia de hiato). Consumo de alcohol. Ciertos alimentos y bebidas, como caf, chocolate, cebollas y [place]. Qu incrementa el riesgo? Tener sobrepeso. Tener una enfermedad que afecta el tejido conjuntivo. Tomar antiinflamatorios no esteroideos (AINE), como el ibuprofeno. Cules son los signos o sntomas? Acidez estomacal. Dificultad o dolor al tragar. Sensacin de Best boy un bulto en la garganta. Sabor amargo en la boca. Mal aliento. Tener una gran cantidad de saliva. Estmago inflamado o con Tree surgeon. Eructos. Dolor en el pecho. El dolor de pecho puede deberse a distintas afecciones. Asegrese de Teacher, adult education a su mdico si tiene Tourist information centre manager. Dificultad para respirar o sibilancias. Ardelia Mems tos a largo plazo o tos nocturna. Desgaste de la superficie de los dientes (esmalte dental). Prdida de peso. Cmo se trata? Realizar cambios en la dieta. Tomar medicamentos. Someterse a [place]. El tratamiento depender de la gravedad de los sntomas. Siga estas instrucciones en su casa: Comida y bebida  Siga una dieta como se lo haya indicado el mdico. Es  posible que deba evitar alimentos y bebidas, por ejemplo: Caf y t negro, con o sin cafena. Bebidas que contengan alcohol. Bebidas energticas y deportivas. Bebidas gaseosas (carbonatadas) y refrescos. Chocolate y cacao. Menta y [place]. Ajo y cebolla. Rbano picante. Alimentos cidos y condimentados. Estos incluyen todos los tipos de pimientos, [place] en polvo, curry en polvo, vinagre, salsas picantes y [employer] Ctricos y sus jugos, por ejemplo, naranjas, limones y limas. Alimentos que [employer]. Estos incluyen salsa roja, [place], salsa picante y pizza con salsa de [place]. Alimentos fritos y Radio broadcast assistant. Estos incluyen donas, papas fritas, papitas fritas de bolsa y aderezos con alto contenido de [place]. Carnes con alto contenido de [place]. Estas incluye los perros calientes, chuletas o costillas, embutidos, jamn y tocino. Productos lcteos ricos en grasas, como leche [place], [place] y [place] crema. Consuma pequeas cantidades de comida con ms frecuencia. Evite consumir porciones abundantes. Evite beber grandes cantidades de lquidos con las comidas. Evite comer 2 o 3 horas antes de acostarse. Evite recostarse inmediatamente despus de comer. No haga ejercicios enseguida despus de comer. Estilo de vida  No fume ni consuma ningn producto que contenga nicotina o tabaco. Si necesita ayuda para dejar de consumir estos productos, consulte al mdico. Intente reducir el nivel de estrs. Si necesita ayuda para hacer esto, consulte al mdico. Si tiene sobrepeso, baje una cantidad de peso saludable para usted. Consulte  a su mdico para bajar de peso de [employer]. Instrucciones generales Est atento a cualquier cambio en los sntomas. Tome los medicamentos de venta libre y los recetados solamente como se lo haya indicado el mdico. No tome aspirina, ibuprofeno ni otros antiinflamatorios no esteroideos (AINE) a menos que el mdico lo autorice. Use ropa holgada. No use nada  apretado alrededor de la cintura. [place] (eleve) la cabecera de la cama aproximadamente 6 pulgadas (15 cm). Para hacerlo, es posible que tenga que utilizar una cua. Evite inclinarse si al hacerlo empeoran los sntomas. Cumpla con todas las visitas de seguimiento. Comunquese con un mdico si: Aparecen nuevos sntomas. [place] y no sabe por qu. Tiene problemas para tragar o le duele cuando traga. Tiene sibilancias o tos persistente. Tiene la voz ronca. Los sntomas no mejoran con Dispensing optician. Solicite ayuda de inmediato si: Tree surgeon repentino [employer], el cuello, la [place], los dientes o la espalda. De repente se siente transpirado, mareado o aturdido. Siente falta de aire o Tourist information centre manager. Vomita y el vmito es de color verde, amarillo o negro, o tiene un aspecto similar a la sangre o a los posos de caf. Se desmaya. Las heces (deposiciones) son rojas, sanguinolentas o negras. No puede tragar, beber o comer. Estos sntomas pueden representar un problema grave que constituye Engineer, maintenance (IT). No espere a ver si los sntomas desaparecen. Solicite atencin mdica de inmediato. Comunquese con el servicio de emergencias de su localidad (911 en los [place]). No conduzca por sus propios medios [employer]. Resumen Si una persona tiene enfermedad de reflujo gastroesofgico (ERGE), los alimentos y el cido estomacal suben al esfago y causan sntomas o problemas tales como dao en el esfago. El tratamiento depender de la gravedad de los sntomas. Siga una dieta como se lo haya indicado el mdico. Use todos los medicamentos solamente como se lo haya indicado el mdico. Esta informacin no tiene Marine scientist el consejo del mdico. Asegrese de hacerle al mdico cualquier pregunta que tenga. Document Revised: 10/24/2019 Document Reviewed: 10/24/2019 Elsevier Patient Education  [place].

## 2021-10-17 NOTE — Progress Notes (Signed)
10/17/2021 Patrick Marshall [id number] 03/11/1946   Chief Complaint: Lump sensation in throat  History of Present Illness: Toivo A. Langham is a 76 year old male with a past medical history of hypertension, DM II, hyperlipidemia, chronic DOE and glaucoma. Past cholecystectomy and cataract surgery.  He speaks Spanish therefore he is presented by a [place] Spanish interpreter who facilitates communication throughout today's consult.  He underwent an EGD by Dr. Tarri Glenn 09/13/2021 secondary to having epigastric pain, reflux symptoms, some difficulty swallowing water and a globus sensation.  The EGD identified mild esophagitis and mild chronic gastritis.  No evidence of H. pylori.  The esophagus was empirically dilated.  Postprocedure he was advised to take pantoprazole 40 mg twice daily for 8 weeks then to decrease to once daily.  A screening colonoscopy was recommended, however he declined.  He presents today for further follow-up.  He continues to feel a lump sensation in his throat.  He describes feeling as if a ball goes up and down in his throat when he swallows.  He denies having any difficulty swallowing food, liquids or pills.  No heartburn.  He has stomach burning and rumbling sensation which occurs daily.  He endorses having sinus pressure with a history of environmental allergies.  No obvious postnasal drainage.  He denies feeling anxious.  He avoids NSAIDs.  He is passing normal formed brown bowel movement most days.  No rectal bleeding or black stools.  As previously reviewed, he was found to have an elevated alk phos level of 267 and GGT levels 12/202. He underwent a RUQ sonogram as ordered by Dr. Cindi Carbon 03/16/2021 which showed a 1.6 cm right hepatic lesion and heterogenous liver echotexture possibly due to hepatocellular disease.  He subsequently underwent an abdominal MRI with contrast 04/13/2021 and the 1.2 cm right hepatic liver lesion was identified as a benign hemangioma.  No  evidence of biliary dilatation     Latest Ref Rng & Units 02/28/2021    8:46 AM 12/18/2019   12:23 PM 09/15/2016    8:18 AM  CBC  WBC 3.8 - 10.8 Thousand/uL 6.9  6.9  6.6   Hemoglobin 13.2 - 17.1 g/dL 15.1  14.0  12.9   Hematocrit 38.5 - 50.0 % 45.5  42.4  40.0   Platelets 140 - 400 Thousand/uL 268  258  243        Latest Ref Rng & Units 02/28/2021    8:46 AM 08/27/2020    3:46 PM 02/02/2020   12:05 PM  CMP  Glucose 65 - 99 mg/dL 161  140    BUN 7 - 25 mg/dL 16  20    Creatinine 0.70 - 1.28 mg/dL 1.21  1.17  1.20   Sodium 135 - 146 mmol/L 138  141    Potassium 3.5 - 5.3 mmol/L 4.6  4.5    Chloride 98 - 110 mmol/L 99  104    CO2 20 - 32 mmol/L 31  26    Calcium 8.6 - 10.3 mg/dL 9.8  9.2    Total Protein 6.1 - 8.1 g/dL 6.9  6.8    Total Bilirubin 0.2 - 1.2 mg/dL 0.7  0.5    AST 10 - 35 U/L 22  16    ALT 9 - 46 U/L 29  22    Alk phos 267 on 02/28/2021   EGD 09/13/2021: - Mild esophagitis but no other endoscopic esophageal abnormality to explain patient's dysphagia. Esophagus dilated. Dilated. Biopsied. - Gastritis.  Biopsied. - Erythematous duodenopathy. Biopsied. 1. Surgical [P], duodenal FRAGMENTS OF DUODENAL MUCOSA WITH NORMAL VILLOUS AND CRYPT ARCHITECTURE. MILD INFLAMMATION IS SEEN BUT NO INCREASE IN INTRAEPITHELIAL LYMPHOCYTES IS NOTED. NO DYSPLASIA OR MALIGNANCY IS SEEN. NO HISTOLOGIC EVIDENCE OF CELIAC SPRUE IS SEEN. 2. Surgical [P], gastritis MILD CHRONIC GASTRITIS. NO SIGNIFICANT ACTIVITY, DYSPLASIA OR MALIGNANCY IS SEEN ALCIAN BLUE STAIN WITH APPROPRIATE CONTROLS IS NEGATIVE FOR INTESTINAL METAPLASIA. IMMUNOHISTOCHEMISTRY FOR H. PYLORI IS NEGATIVE FOR H PYLORI-LIKE ORGANISMS. 3. Surgical [P], mid esophagus and proximal esophagus FRAGMENTS OF INFLAMED SQUAMOUS AND GLANDULAR MUCOSA WITH RARE CELL SHOWING INTESTINAL METAPLASIA CONFIRMED BY ALCIAN BLUE STAIN WITH APPROPRIATE CONTROLS. NO SIGNIFICANT NUMBER OF EOSINOPHILS ARE SEEN. NO DYSPLASIA OR MALIGNANCY IS  SEEN. CLINICAL AND ENDOSCOPIC CORELATION IS REQUIRED. 4. Surgical [P], distal esophagus FRAGMENTS OF SQUAMOUS MUCOSA SHOWING MILD INFLAMMATION.. NO SIGNIFICANT NUMBER OF EOSINOPHILS, DYSPLASIA OR MALIGNANCY IS SEEN. CLINICAL AND ENDOSCOPIC CORRELATION IS REQUIRED.   RUQ sono 03/16/2021: 1. 1.6 cm hypoechoic indeterminate right hepatic lesion. Recommend further evaluation with MRI. 2. Heterogeneous liver echotexture may be related to diffuse hepatocellular disease. Please correlate clinically. 3. Cholecystectomy.   Abdominal MRI/ 04/13/2021: 1. 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 This lesion is benign and requires no further workup.  Current Outpatient Medications on File Prior to Visit  Medication Sig Dispense Refill   aspirin EC 81 MG tablet Take 81 mg by mouth daily. Swallow whole.     atorvastatin (LIPITOR) 40 MG tablet Take 1 tablet (40 mg total) by mouth daily. 90 tablet 3   ketorolac (ACULAR) 0.5 % ophthalmic solution Place 1 drop into the left eye 2 (two) times daily. 5 mL 0   latanoprost (XALATAN) 0.005 % ophthalmic solution Place 1 drop into the left eye daily.     lisinopril (ZESTRIL) 10 MG tablet Take 1 tablet by mouth once daily 90 tablet 0   metFORMIN (GLUCOPHAGE) 500 MG tablet Take 1 tablet by mouth twice daily 180 tablet 0   pantoprazole (PROTONIX) 40 MG tablet Take 1 tablet (40 mg total) by mouth 2 (two) times daily. Take twice a day for 8 weeks, then reduce back down to daily in am 90 tablet 3   allopurinol (ZYLOPRIM) 100 MG tablet Take 1.5 tablets (150 mg total) by mouth daily. (Patient not taking: Reported on 10/17/2021) 180 tablet 3   metoCLOPramide (REGLAN) 10 MG tablet Take 1 tablet (10 mg total) by mouth every 6 (six) hours as needed for nausea. PLEASE LABEL IN [facility] (Patient not taking: Reported on 09/13/2021) 90 tablet 1   metoprolol succinate (TOPROL-XL) 25 MG 24 hr tablet Take 0.5 tablets (12.5 mg total) by mouth daily. (Patient not taking: Reported on  09/13/2021) 45 tablet 3   nitroGLYCERIN (NITROSTAT) 0.4 MG SL tablet Place 1 tablet (0.4 mg total) under the tongue every 5 (five) minutes as needed for chest pain. (Patient not taking: Reported on 07/28/2021) 50 tablet 3   pantoprazole (PROTONIX) 40 MG tablet Take 1 tablet (40 mg total) by mouth daily. (Patient not taking: Reported on 09/13/2021) 90 tablet 1   sucralfate (CARAFATE) 1 g tablet Take 1 tablet (1 g total) by mouth 3 (three) times daily before meals. (Patient not taking: Reported on 09/13/2021) 90 tablet 1   No current facility-administered medications on file prior to visit.   Allergies  Allergen Reactions   Penicillins Other (See Comments)    Difficulty sleeping    Current Medications, Allergies, Past Medical History, Past Surgical History, Family History and Social History were  reviewed in [place] record.  Review of Systems:   Constitutional: Negative for fever, sweats, chills or weight loss.  Respiratory: Negative for shortness of breath.   Cardiovascular: Negative for chest pain, palpitations and leg swelling.  Gastrointestinal: See HPI.  Musculoskeletal: Negative for back pain or muscle aches.  Neurological: Negative for dizziness, headaches or paresthesias.   Physical Exam: BP 122/80   Pulse 92   Ht 5' 3"  (1.6 m)   Wt 225 lb (102.1 kg)   SpO2 97%   BMI 39.86 kg/m  General: 76 year old male in no acute distress Head: Normocephalic and atraumatic. Eyes: No scleral icterus. Conjunctiva pink . Ears: Normal auditory acuity. Mouth: Dentition intact. No ulcers or lesions.  No thrush. Lungs: Clear throughout to auscultation. Heart: Regular rate and rhythm, no murmur. Abdomen: Soft, nontender and nondistended. No masses or hepatomegaly. Normal bowel sounds x 4 quadrants.  Rectal: Deferred.  Musculoskeletal: Symmetrical with no gross deformities. Extremities: No edema. Neurological: Alert oriented x 4. No focal deficits.  Psychological: Alert  and cooperative. Normal mood and affect  Assessment and Recommendations:  32) 76 year old male with a globus sensation.  EGD 09/13/2021 showed mild esophagitis, findings did not explain globus sensation or prior liquid dysphagia symptoms. -Barium swallow with tablet -Patient to follow-up with PCP for ENT referral  2) GERD. S/P EGD 09/13/2021 showed mild esophagitis and gastritis without evidence of H. pylori. -Continue Pantoprazole 40 mg twice daily to be taken 30 minutes before breakfast and dinner for 8 weeks and reduced to once daily on 11/13/2021 -May add Famotidine 20 mg 1 tab nightly in 1 week if no improvement, prescription sent to pharmacy -GERD handout provided  3) Elevated alk phos level 267 and elevated GGT 273 on 02/28/2021. Liver MRI 04/13/2021 showed a right liver hemangioma, no evidence of biliary ductal dilatation.  -Hepatic panel and GGT -?  Consider EUS to further evaluate the biliary tree if alk phos level  remains elevated   4) Colon cancer screening, patient declined -To consider re-discussing a screening colonoscopy at time of next follow-up appointment

## 2021-10-18 ENCOUNTER — Ambulatory Visit (INDEPENDENT_AMBULATORY_CARE_PROVIDER_SITE_OTHER): Payer: Medicare HMO | Admitting: Family Medicine

## 2021-10-18 VITALS — BP 112/80 | HR 88 | Temp 97.7°F | Ht 63.0 in | Wt 224.0 lb

## 2021-10-18 DIAGNOSIS — I10 Essential (primary) hypertension: Secondary | ICD-10-CM | POA: Diagnosis not present

## 2021-10-18 DIAGNOSIS — R748 Abnormal levels of other serum enzymes: Secondary | ICD-10-CM

## 2021-10-18 DIAGNOSIS — R0609 Other forms of dyspnea: Secondary | ICD-10-CM | POA: Diagnosis not present

## 2021-10-18 DIAGNOSIS — I209 Angina pectoris, unspecified: Secondary | ICD-10-CM

## 2021-10-18 DIAGNOSIS — E785 Hyperlipidemia, unspecified: Secondary | ICD-10-CM | POA: Diagnosis not present

## 2021-10-18 DIAGNOSIS — E119 Type 2 diabetes mellitus without complications: Secondary | ICD-10-CM | POA: Diagnosis not present

## 2021-10-18 MED ORDER — LISINOPRIL 10 MG PO TABS: 10.0000 mg | ORAL_TABLET | Freq: Every day | ORAL | 3 refills | Status: DC

## 2021-10-18 MED ORDER — SUCRALFATE 1 G PO TABS: 1.0000 g | ORAL_TABLET | Freq: Three times a day (TID) | ORAL | 1 refills | Status: DC

## 2021-10-18 MED ORDER — METOPROLOL SUCCINATE ER 25 MG PO TB24: 12.5000 mg | ORAL_TABLET | Freq: Every day | ORAL | 3 refills | Status: DC

## 2021-10-18 MED ORDER — METOCLOPRAMIDE HCL 10 MG PO TABS: 10.0000 mg | ORAL_TABLET | Freq: Four times a day (QID) | ORAL | 1 refills | Status: DC | PRN

## 2021-10-18 MED ORDER — KETOROLAC TROMETHAMINE 0.5 % OP SOLN: 1.0000 [drp] | Freq: Two times a day (BID) | OPHTHALMIC | 0 refills | Status: DC

## 2021-10-18 NOTE — Progress Notes (Signed)
Subjective:    Patient ID: Patrick Marshall, male    DOB: [birth year], 76 y.o.   MRN: [id number]  HPI  Patient reports shortness of breath and dyspnea on exertion.  He had a stress test of his heart that showed no evidence of blockage in 2021.  He is seeing GI.  He reports epigastric fullness and discomfort.  This is been a chronic problem for the patient that he is mentioned at every visit.  He has had an MRI of the abdomen, and EGD, all of which were reassuring.  To have a barium swallow scheduled for the patient through GI.  However he feels that he has a "problem in his heart".  He reports feeling weak and tired for another reason.  He reports feeling short of breath with activity with decreased stamina Past Medical History:  Diagnosis Date   Cataract    Diabetes mellitus without complication ([place])    x few years.   GERD (gastroesophageal reflux disease)    Glaucoma    Gout    Heart disease    HLD (hyperlipidemia)    Hypertension    Past Surgical History:  Procedure Laterality Date   CHOLECYSTECTOMY     EYE SURGERY     2016 both eyes   UPPER GI ENDOSCOPY  2023   Current Outpatient Medications on File Prior to Visit  Medication Sig Dispense Refill   allopurinol (ZYLOPRIM) 100 MG tablet Take 1.5 tablets (150 mg total) by mouth daily. (Patient not taking: Reported on 10/17/2021) 180 tablet 3   aspirin EC 81 MG tablet Take 81 mg by mouth daily. Swallow whole.     atorvastatin (LIPITOR) 40 MG tablet Take 1 tablet (40 mg total) by mouth daily. 90 tablet 3   famotidine (PEPCID) 20 MG tablet Take 1 tablet (20 mg total) by mouth at bedtime. 30 tablet 1   ketorolac (ACULAR) 0.5 % ophthalmic solution Place 1 drop into the left eye 2 (two) times daily. 5 mL 0   latanoprost (XALATAN) 0.005 % ophthalmic solution Place 1 drop into the left eye daily.     lisinopril (ZESTRIL) 10 MG tablet Take 1 tablet by mouth once daily 90 tablet 0   metFORMIN (GLUCOPHAGE) 500 MG tablet Take 1 tablet by  mouth twice daily 180 tablet 0   metoCLOPramide (REGLAN) 10 MG tablet Take 1 tablet (10 mg total) by mouth every 6 (six) hours as needed for nausea. PLEASE LABEL IN [facility] (Patient not taking: Reported on 09/13/2021) 90 tablet 1   metoprolol succinate (TOPROL-XL) 25 MG 24 hr tablet Take 0.5 tablets (12.5 mg total) by mouth daily. (Patient not taking: Reported on 09/13/2021) 45 tablet 3   nitroGLYCERIN (NITROSTAT) 0.4 MG SL tablet Place 1 tablet (0.4 mg total) under the tongue every 5 (five) minutes as needed for chest pain. (Patient not taking: Reported on 07/28/2021) 50 tablet 3   pantoprazole (PROTONIX) 40 MG tablet Take 1 tablet (40 mg total) by mouth daily. (Patient not taking: Reported on 09/13/2021) 90 tablet 1   pantoprazole (PROTONIX) 40 MG tablet Take 1 tablet (40 mg total) by mouth 2 (two) times daily. Take twice a day for 8 weeks, then reduce back down to daily in am 90 tablet 3   sucralfate (CARAFATE) 1 g tablet Take 1 tablet (1 g total) by mouth 3 (three) times daily before meals. (Patient not taking: Reported on 09/13/2021) 90 tablet 1   No current facility-administered medications on file prior to visit.  Allergies  Allergen Reactions   Penicillins Other (See Comments)    Difficulty sleeping   Social History   Socioeconomic History   Marital status: Married    Spouse name: Not on file   Number of children: 5   Years of education: Not on file   Highest education level: Not on file  Occupational History   Occupation: Interior and spatial designer  Tobacco Use   Smoking status: Former    Years: 3.00    Types: Cigarettes    Quit date: 1993    Years since quitting: 30.5   Smokeless tobacco: Never  Vaping Use   Vaping Use: Never used  Substance and Sexual Activity   Alcohol use: Yes    Comment: occasional   Drug use: No   Sexual activity: Not on file  Other Topics Concern   Not on file  Social History Narrative   Not on file   Social Determinants of Health   Financial Resource  Strain: Not on file  Food Insecurity: Not on file  Transportation Needs: Not on file  Physical Activity: Not on file  Stress: Not on file  Social Connections: Not on file  Intimate Partner Violence: Not on file      Review of Systems  All other systems reviewed and are negative.      Objective:   Physical Exam Vitals reviewed.  Constitutional:      General: He is not in acute distress.    Appearance: Normal appearance. He is obese. He is not ill-appearing or toxic-appearing.  HENT:     Right Ear: Tympanic membrane and ear canal normal.     Left Ear: Tympanic membrane and ear canal normal.  Neck:     Vascular: No carotid bruit.  Cardiovascular:     Rate and Rhythm: Normal rate and regular rhythm.     Pulses: Normal pulses.     Heart sounds: Normal heart sounds. No murmur heard.    No friction rub. No gallop.  Pulmonary:     Effort: Pulmonary effort is normal. No respiratory distress.     Breath sounds: Normal breath sounds. No stridor. No wheezing, rhonchi or rales.  Chest:     Chest wall: No tenderness.  Abdominal:     General: Abdomen is flat. Bowel sounds are normal. There is no distension.     Palpations: Abdomen is soft.     Tenderness: There is no abdominal tenderness. There is no guarding.  Musculoskeletal:     Right lower leg: No edema.     Left lower leg: No edema.  Neurological:     Mental Status: He is alert.           Assessment & Plan:  Controlled type 2 diabetes mellitus without complication, without long-term current use of insulin (HCC) - Plan: CBC with Differential/Platelet, Lipid panel, Microalbumin, urine, BASIC METABOLIC PANEL WITH GFR, Hemoglobin A1c  Primary hypertension  Hyperlipidemia, unspecified hyperlipidemia type  Elevated alkaline phosphatase level  Dyspnea on exertion Based on his dyspnea on exertion, I will schedule the patient for an echocardiogram to evaluate for any evidence of suppressed ejection fraction.  Blood  pressure today is acceptable at 112/80.  Check CBC CMP lipid panel A1c.  Goal LDL cholesterol is less than 100.  Goal A1c is less than 6.5.  Monitor for any anemia with a CBC.  Liver function test were for just checked by GI yesterday.  He denies any cough, hemoptysis, wheezing, pleurisy, fever, night sweats.  And his  pulmonary exam is completely normal

## 2021-10-18 NOTE — Addendum Note (Signed)
Addended by: Colman Cater on: 10/18/2021 09:47 AM   Modules accepted: Orders

## 2021-10-19 LAB — LIPID PANEL
Cholesterol: 113 mg/dL (ref <200)
HDL: 55 mg/dL (ref >=40)
LDL Cholesterol (Calc): 38 mg/dL (calc)
Non-HDL Cholesterol (Calc): 58 mg/dL (calc) (ref <130)
Total CHOL/HDL Ratio: 2.1 (calc) (ref <5.0)
Triglycerides: 115 mg/dL (ref <150)

## 2021-10-19 LAB — BASIC METABOLIC PANEL WITH GFR
BUN/Creatinine Ratio: 15 (calc) (ref 6–22)
BUN: 20 mg/dL (ref 7–25)
CO2: 26 mmol/L (ref 20–32)
Calcium: 9.5 mg/dL (ref 8.6–10.3)
Chloride: 103 mmol/L (ref 98–110)
Creat: 1.34 mg/dL — ABNORMAL HIGH (ref 0.70–1.28)
Glucose, Bld: 126 mg/dL — ABNORMAL HIGH (ref 65–99)
Potassium: 5 mmol/L (ref 3.5–5.3)
Sodium: 139 mmol/L (ref 135–146)
eGFR: 56 mL/min/{1.73_m2} — ABNORMAL LOW (ref >=60)

## 2021-10-19 LAB — CBC WITH DIFFERENTIAL/PLATELET
Absolute Monocytes: 431 cells/uL (ref 200–950)
Basophils Absolute: 83 cells/uL (ref 0–200)
Basophils Relative: 1.4 %
Eosinophils Absolute: 248 cells/uL (ref 15–500)
Eosinophils Relative: 4.2 %
HCT: 43.3 % (ref 38.5–50.0)
Hemoglobin: 14.9 g/dL (ref 13.2–17.1)
Lymphs Abs: 1982 cells/uL (ref 850–3900)
MCH: 32 pg (ref 27.0–33.0)
MCHC: 34.4 g/dL (ref 32.0–36.0)
MCV: 92.9 fL (ref 80.0–100.0)
MPV: 11.4 fL (ref 7.5–12.5)
Monocytes Relative: 7.3 %
Neutro Abs: 3157 cells/uL (ref 1500–7800)
Neutrophils Relative %: 53.5 %
Platelets: 256 10*3/uL (ref 140–400)
RBC: 4.66 10*6/uL (ref 4.20–5.80)
RDW: 12.3 % (ref 11.0–15.0)
Total Lymphocyte: 33.6 %
WBC: 5.9 10*3/uL (ref 3.8–10.8)

## 2021-10-19 LAB — MICROALBUMIN, URINE: Microalb, Ur: <0.2 mg/dL

## 2021-10-19 LAB — HEMOGLOBIN A1C
Hgb A1c MFr Bld: 6.7 % of total Hgb — ABNORMAL HIGH (ref <5.7)
Mean Plasma Glucose: 146 mg/dL
eAG (mmol/L): 8.1 mmol/L

## 2021-10-30 NOTE — Progress Notes (Signed)
Reviewed and agree with management plans. ? ?Lance Galas L. Xadrian Craighead, MD, MPH  ?

## 2021-10-31 ENCOUNTER — Ambulatory Visit (HOSPITAL_COMMUNITY)
Admission: RE | Admit: 2021-10-31 | Discharge: 2021-10-31 | Disposition: A | Discharge status: Home / Self Care | Payer: Medicare HMO | Source: Ambulatory Visit | Attending: Nurse Practitioner | Admitting: Nurse Practitioner

## 2021-10-31 DIAGNOSIS — R0989 Other specified symptoms and signs involving the circulatory and respiratory systems: Secondary | ICD-10-CM | POA: Insufficient documentation

## 2021-10-31 DIAGNOSIS — R748 Abnormal levels of other serum enzymes: Secondary | ICD-10-CM | POA: Insufficient documentation

## 2021-10-31 DIAGNOSIS — K219 Gastro-esophageal reflux disease without esophagitis: Secondary | ICD-10-CM | POA: Insufficient documentation

## 2021-10-31 DIAGNOSIS — K224 Dyskinesia of esophagus: Secondary | ICD-10-CM | POA: Diagnosis not present

## 2021-11-08 ENCOUNTER — Ambulatory Visit (HOSPITAL_COMMUNITY): Payer: Medicare HMO | Attending: Cardiovascular Disease

## 2021-11-08 DIAGNOSIS — R0609 Other forms of dyspnea: Secondary | ICD-10-CM | POA: Insufficient documentation

## 2021-11-08 LAB — ECHOCARDIOGRAM COMPLETE
Area-P 1/2: 3.42 cm2
S' Lateral: 3.5 cm

## 2021-11-09 ENCOUNTER — Other Ambulatory Visit: Payer: Self-pay

## 2021-11-09 DIAGNOSIS — R748 Abnormal levels of other serum enzymes: Secondary | ICD-10-CM

## 2021-12-13 ENCOUNTER — Other Ambulatory Visit: Payer: Self-pay | Admitting: Family Medicine

## 2021-12-13 DIAGNOSIS — I209 Angina pectoris, unspecified: Secondary | ICD-10-CM

## 2022-01-01 ENCOUNTER — Other Ambulatory Visit: Payer: Self-pay | Admitting: Family Medicine

## 2022-01-01 DIAGNOSIS — I209 Angina pectoris, unspecified: Secondary | ICD-10-CM

## 2022-01-24 ENCOUNTER — Other Ambulatory Visit: Payer: Self-pay

## 2022-01-24 DIAGNOSIS — E119 Type 2 diabetes mellitus without complications: Secondary | ICD-10-CM

## 2022-01-24 MED ORDER — METFORMIN HCL 500 MG PO TABS: 500.0000 mg | ORAL_TABLET | Freq: Two times a day (BID) | ORAL | 0 refills | Status: DC

## 2022-02-09 ENCOUNTER — Telehealth: Payer: Self-pay

## 2022-02-09 NOTE — Telephone Encounter (Signed)
-----   Message from Gillermina Hu, RN sent at 11/09/2021  8:34 AM EDT ----- Pt needs to repeat labs: Message sent 11/09/2021

## 2022-02-09 NOTE — Telephone Encounter (Signed)
Received personal reminder: Spoke with Pt daughter Patrick Marshall and notified her that the provider is requesting that Patrick Marshall have repeat labs test done: Address,  location, and hours of operation of lab provided:  Order is in Epic: Patrick Marshall verbalized understanding with all questions answered.

## 2022-02-10 IMAGING — US US ABDOMEN LIMITED
1 series · 14 of 25 positions shown · non-contrast
Comparison: CT abdomen and pelvis 01/27/2012.

CLINICAL DATA: Elevated alkaline phosphatase. Status post
cholecystectomy.

EXAM:
ULTRASOUND ABDOMEN LIMITED RIGHT UPPER QUADRANT

[Series 1: us abdomen limited · 0.22mm/px · 14 of 42 slices shown]
[im 1/42]
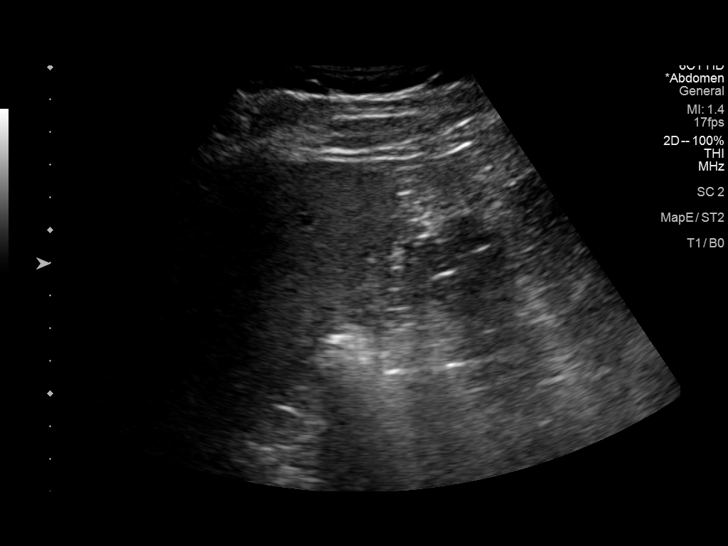
[im 4/42]
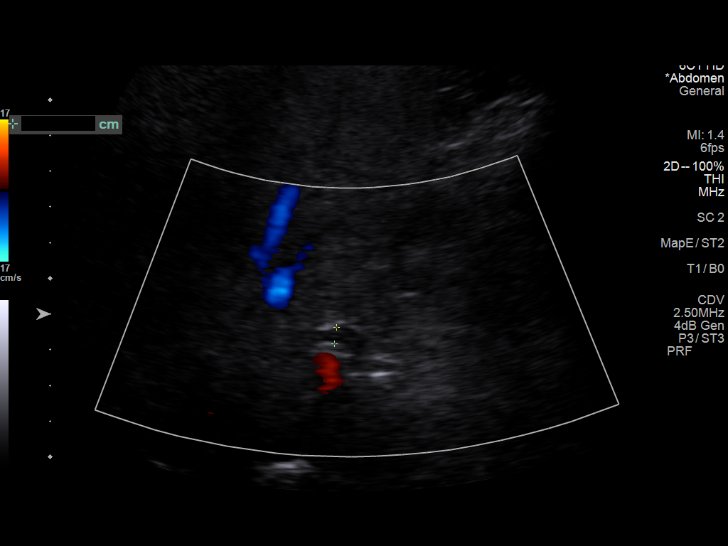
[im 7/42]
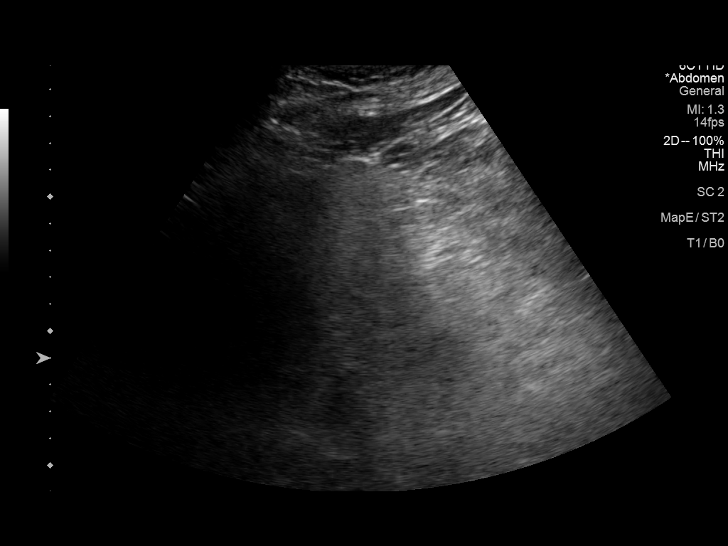
[im 11/42]
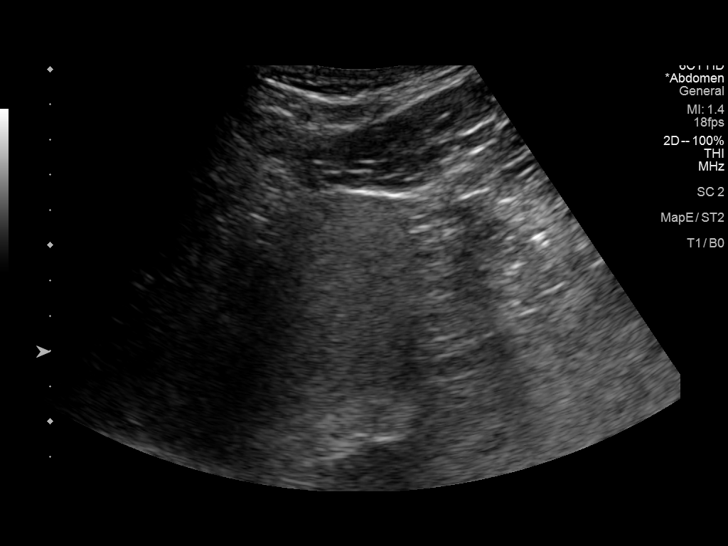
[im 14/42]
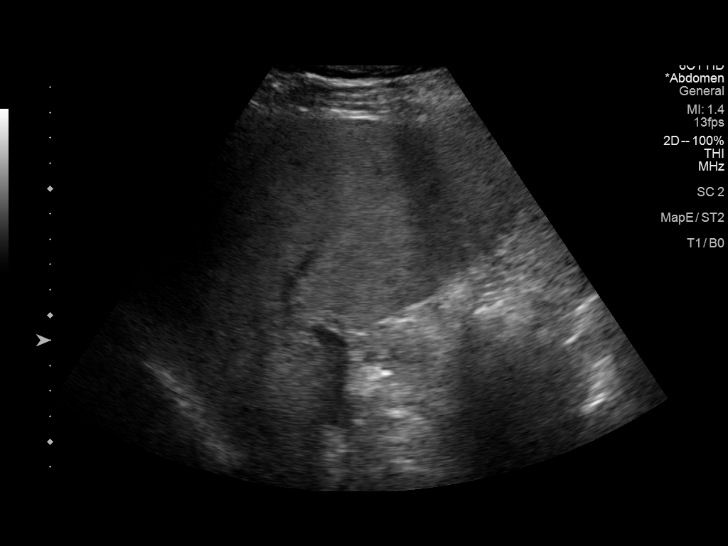
[im 16/42]
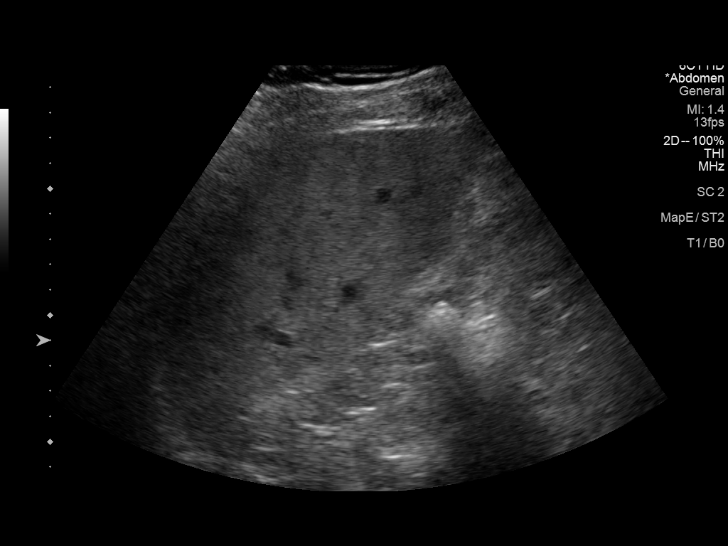
[im 19/42]
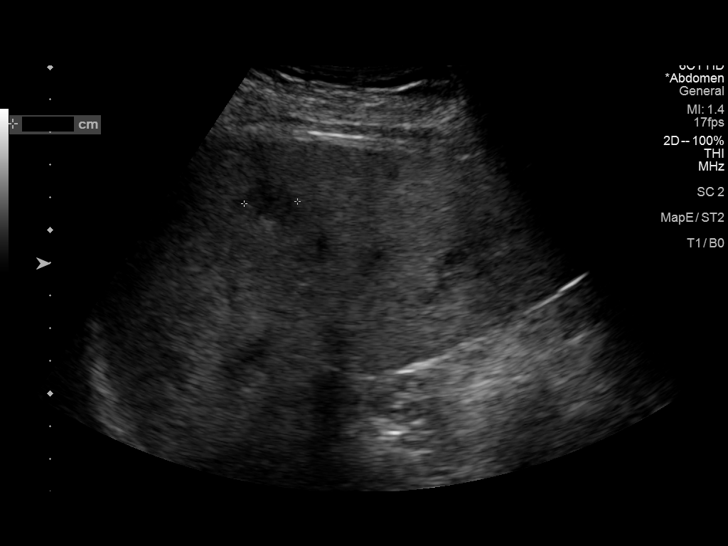
[im 23/42]
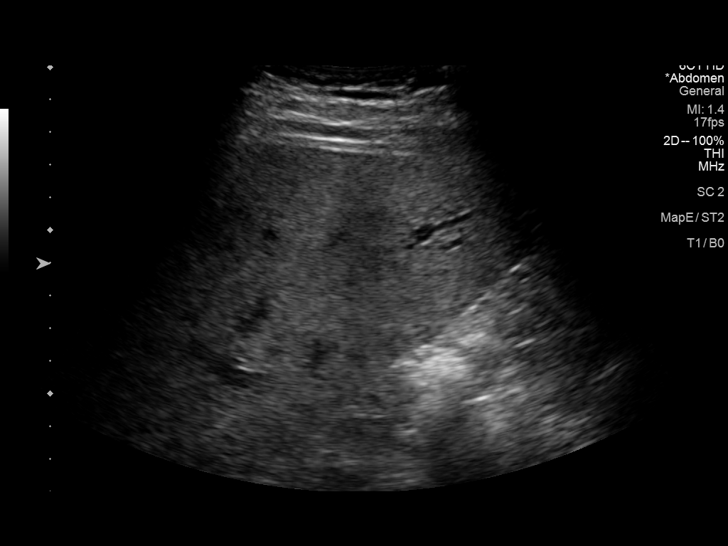
[im 26/42]
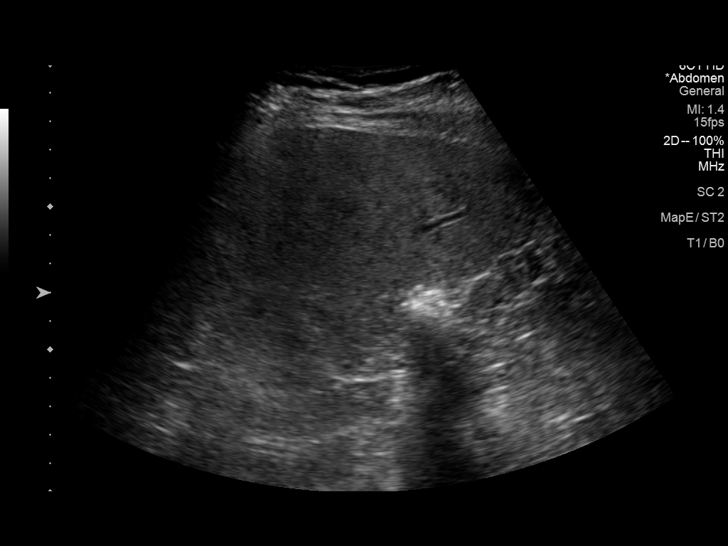
[im 28/42]
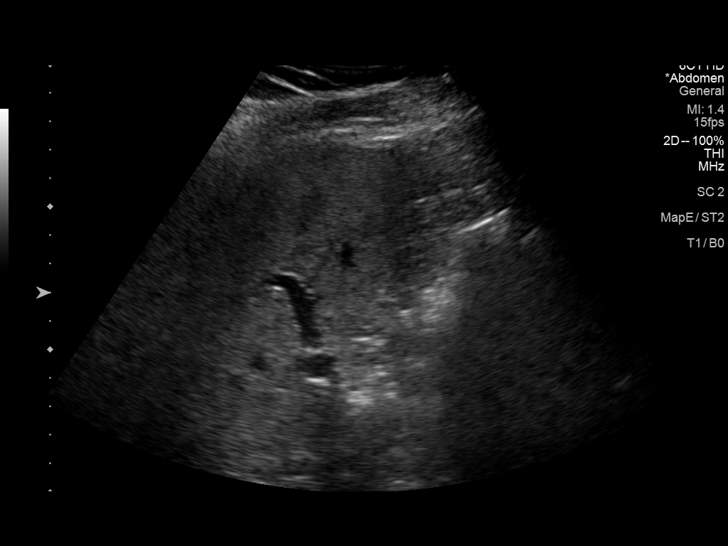
[im 31/42]
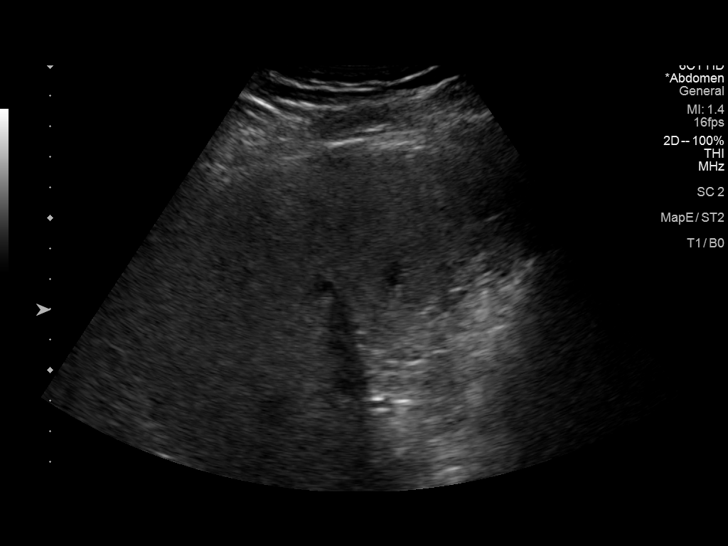
[im 35/42]
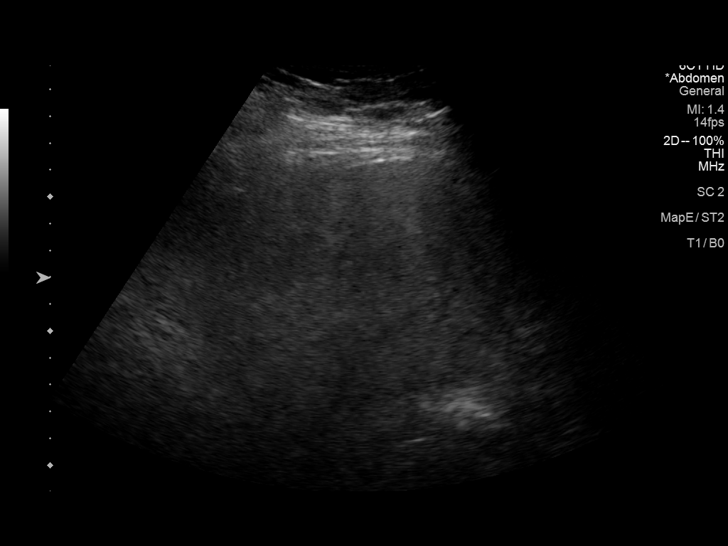
[im 38/42]
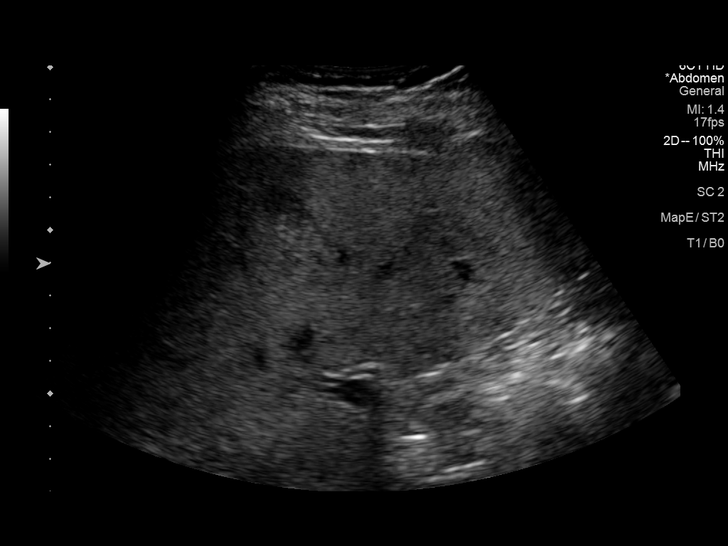
[im 42/42]
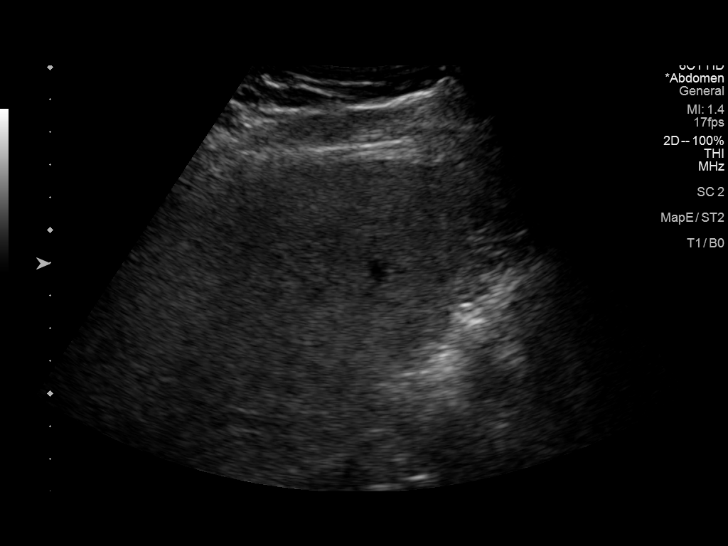

[14 of 25 positions shown; findings below may reference images not displayed]

FINDINGS: Gallbladder:

Surgically absent.

Common bile duct:

Diameter: 4.6 mm

Liver:

There is a 1.6 x 1.2 x 1.2 cm hypoechoic indeterminate lesion in the
right lobe of the liver. The liver is normal in echotexture but
heterogeneous. Within normal limits in parenchymal echogenicity.
Portal vein is patent on color Doppler imaging with normal direction
of blood flow towards the liver.

Other: None.
IMPRESSION: 1. 1.6 cm hypoechoic indeterminate right hepatic lesion. Recommend
further evaluation with MRI.
2. Heterogeneous liver echotexture may be related to diffuse
hepatocellular disease. Please correlate clinically.
3. Cholecystectomy.

## 2022-02-18 ENCOUNTER — Other Ambulatory Visit: Payer: Self-pay | Admitting: Gastroenterology

## 2022-02-18 DIAGNOSIS — R1013 Epigastric pain: Secondary | ICD-10-CM

## 2022-03-01 ENCOUNTER — Telehealth: Payer: Self-pay | Admitting: Family Medicine

## 2022-03-01 NOTE — Telephone Encounter (Signed)
No answer unable to leave a message for patient to call back and schedule Medicare Annual Wellness Visit (AWV) in office.   If not able to come in office, please offer to do virtually or by telephone.   Last AWV:02/26/2018   Please schedule at anytime with [facility]  If any questions, please contact me at [phone].  Thank you ,  Colletta [place]

## 2022-03-23 ENCOUNTER — Ambulatory Visit (INDEPENDENT_AMBULATORY_CARE_PROVIDER_SITE_OTHER): Payer: Medicare HMO | Admitting: Family Medicine

## 2022-03-23 ENCOUNTER — Encounter: Payer: Self-pay | Admitting: Family Medicine

## 2022-03-23 VITALS — BP 112/62 | HR 76 | Ht 63.0 in | Wt 228.2 lb

## 2022-03-23 DIAGNOSIS — R351 Nocturia: Secondary | ICD-10-CM

## 2022-03-23 DIAGNOSIS — R1013 Epigastric pain: Secondary | ICD-10-CM

## 2022-03-23 DIAGNOSIS — H9042 Sensorineural hearing loss, unilateral, left ear, with unrestricted hearing on the contralateral side: Secondary | ICD-10-CM

## 2022-03-23 DIAGNOSIS — E161 Other hypoglycemia: Secondary | ICD-10-CM | POA: Diagnosis not present

## 2022-03-23 MED ORDER — PANTOPRAZOLE SODIUM 40 MG PO TBEC: 40.0000 mg | DELAYED_RELEASE_TABLET | Freq: Every day | ORAL | 1 refills | Status: DC

## 2022-03-23 MED ORDER — TAMSULOSIN HCL 0.4 MG PO CAPS: 0.4000 mg | ORAL_CAPSULE | Freq: Every day | ORAL | 3 refills | Status: DC

## 2022-03-23 MED ORDER — SUCRALFATE 1 G PO TABS: 1.0000 g | ORAL_TABLET | Freq: Three times a day (TID) | ORAL | 1 refills | Status: DC

## 2022-03-23 NOTE — Progress Notes (Signed)
Subjective:    Patient ID: Patrick Marshall, male    DOB: Jul 02, 1945, 76 y.o.   MRN: [id number]  HPI  Patient is a 76 year old Hispanic gentleman with a history of type 2 diabetes mellitus who presents today complaining of hearing loss in his left ear.  The hearing loss has been present for 6 to 12 months.  The history is vague but it certainly been for quite some time.  He states that he never any longer into the.  Pain evaluation today shows hearing to 25 dB at all frequencies in his right ear however he is unable to appreciate 500, 1000, 2000, or 4000 Hz in his left ear up to 40 dB.  On examination there is no blockage in his left auditory canal.  His tympanic membrane is normal and healthy in appearance.  There is no evidence of any scar tissue.  He also reports nocturia.  He states that he gets up after sleeping 2 hours and then he will awaken every hour to urinate.  He reports a weak stream.  This been going on for quite some time to.  He denies any dysuria or hematuria.  He has not been checking his sugars regularly  Past Medical History:  Diagnosis Date   Cataract    Diabetes mellitus without complication ([place])    x few years.   GERD (gastroesophageal reflux disease)    Glaucoma    Gout    Heart disease    HLD (hyperlipidemia)    Hypertension    Past Surgical History:  Procedure Laterality Date   CHOLECYSTECTOMY     EYE SURGERY     2016 both eyes   UPPER GI ENDOSCOPY  2023   Current Outpatient Medications on File Prior to Visit  Medication Sig Dispense Refill   allopurinol (ZYLOPRIM) 100 MG tablet Take 1.5 tablets (150 mg total) by mouth daily. 180 tablet 3   aspirin EC 81 MG tablet Take 81 mg by mouth daily. Swallow whole.     atorvastatin (LIPITOR) 40 MG tablet Take 1 tablet (40 mg total) by mouth daily. 90 tablet 3   famotidine (PEPCID) 20 MG tablet Take 1 tablet (20 mg total) by mouth at bedtime. 30 tablet 1   ketorolac (ACULAR) 0.5 % ophthalmic solution Place 1 drop  into the left eye 2 (two) times daily. 5 mL 0   latanoprost (XALATAN) 0.005 % ophthalmic solution Place 1 drop into the left eye daily.     lisinopril (ZESTRIL) 10 MG tablet Take 1 tablet (10 mg total) by mouth daily. 90 tablet 3   metFORMIN (GLUCOPHAGE) 500 MG tablet Take 1 tablet (500 mg total) by mouth 2 (two) times daily. 180 tablet 0   metoCLOPramide (REGLAN) 10 MG tablet Take 1 tablet (10 mg total) by mouth every 6 (six) hours as needed for nausea. PLEASE LABEL IN [facility] 90 tablet 1   metoprolol succinate (TOPROL-XL) 25 MG 24 hr tablet Take 0.5 tablets (12.5 mg total) by mouth daily. 45 tablet 3   nitroGLYCERIN (NITROSTAT) 0.4 MG SL tablet DISSOLVE ONE TABLET UNDER THE TONGUE EVERY 5 MINUTES AS NEEDED FOR CHEST PAIN.  DO NOT EXCEED A TOTAL OF 3 DOSES IN 15 MINUTES 50 tablet 0   pantoprazole (PROTONIX) 40 MG tablet Take 1 tablet (40 mg total) by mouth daily. 90 tablet 1   sucralfate (CARAFATE) 1 g tablet Take 1 tablet (1 g total) by mouth 3 (three) times daily before meals. 90 tablet 1  No current facility-administered medications on file prior to visit.   Allergies  Allergen Reactions   Penicillins Other (See Comments)    Difficulty sleeping   Social History   Socioeconomic History   Marital status: Married    Spouse name: Not on file   Number of children: 5   Years of education: Not on file   Highest education level: Not on file  Occupational History   Occupation: Interior and spatial designer  Tobacco Use   Smoking status: Former    Years: 3.00    Types: Cigarettes    Quit date: 1993    Years since quitting: 31.0   Smokeless tobacco: Never  Vaping Use   Vaping Use: Never used  Substance and Sexual Activity   Alcohol use: Yes    Comment: occasional   Drug use: No   Sexual activity: Not on file  Other Topics Concern   Not on file  Social History Narrative   Not on file   Social Determinants of Health   Financial Resource Strain: Not on file  Food Insecurity: Not on file   Transportation Needs: Not on file  Physical Activity: Not on file  Stress: Not on file  Social Connections: Not on file  Intimate Partner Violence: Not on file      Review of Systems  All other systems reviewed and are negative.      Objective:   Physical Exam Vitals reviewed.  Constitutional:      General: He is not in acute distress.    Appearance: Normal appearance. He is obese. He is not ill-appearing or toxic-appearing.  HENT:     Right Ear: Hearing, tympanic membrane and ear canal normal. There is no impacted cerumen.     Left Ear: Tympanic membrane and ear canal normal. Decreased hearing noted. There is no impacted cerumen.  Neck:     Vascular: No carotid bruit.  Cardiovascular:     Rate and Rhythm: Normal rate and regular rhythm.     Pulses: Normal pulses.     Heart sounds: Normal heart sounds. No murmur heard.    No friction rub. No gallop.  Pulmonary:     Effort: Pulmonary effort is normal. No respiratory distress.     Breath sounds: Normal breath sounds. No stridor. No wheezing, rhonchi or rales.  Chest:     Chest wall: No tenderness.  Abdominal:     General: Abdomen is flat. Bowel sounds are normal. There is no distension.     Palpations: Abdomen is soft.     Tenderness: There is no abdominal tenderness. There is no guarding.  Musculoskeletal:     Right lower leg: No edema.     Left lower leg: No edema.  Neurological:     Mental Status: He is alert.           Assessment & Plan:  Sensorineural hearing loss (SNHL) of left ear with unrestricted hearing of right ear - Plan: Ambulatory referral to ENT  Abdominal pain, epigastric - Plan: pantoprazole (PROTONIX) 40 MG tablet  Nocturia - Plan: Hemoglobin A1c, COMPLETE METABOLIC PANEL WITH GFR, PSA Patient has asymmetric left-sided hearing loss.  I believe this most likely sensorineural.  Consult ENT given the asymmetry.  Patient will likely need a hearing aid.  Patient request a refill on his Protonix  and sucralfate that he takes for GERD.  I believe his nocturia is likely due to lower urinary tract symptoms related to BPH.  Try Flomax 0.4 mg p.o. nightly.  Meanwhile check a baseline PSA.  Also check a hemoglobin A1c to ensure that the patient does not have severe hypoglycemia contributing to urinary frequency.

## 2022-03-24 LAB — HEMOGLOBIN A1C
Hgb A1c MFr Bld: 7.1 % of total Hgb — ABNORMAL HIGH (ref <5.7)
Mean Plasma Glucose: 157 mg/dL
eAG (mmol/L): 8.7 mmol/L

## 2022-03-24 LAB — COMPLETE METABOLIC PANEL WITH GFR
AG Ratio: 1.5 (calc) (ref 1.0–2.5)
ALT: 17 U/L (ref 9–46)
AST: 16 U/L (ref 10–35)
Albumin: 4.1 g/dL (ref 3.6–5.1)
Alkaline phosphatase (APISO): 123 U/L (ref 35–144)
BUN: 17 mg/dL (ref 7–25)
CO2: 27 mmol/L (ref 20–32)
Calcium: 9.5 mg/dL (ref 8.6–10.3)
Chloride: 104 mmol/L (ref 98–110)
Creat: 1.24 mg/dL (ref 0.70–1.28)
Globulin: 2.8 g/dL (calc) (ref 1.9–3.7)
Glucose, Bld: 130 mg/dL — ABNORMAL HIGH (ref 65–99)
Potassium: 4.4 mmol/L (ref 3.5–5.3)
Sodium: 140 mmol/L (ref 135–146)
Total Bilirubin: 0.5 mg/dL (ref 0.2–1.2)
Total Protein: 6.9 g/dL (ref 6.1–8.1)
eGFR: 61 mL/min/{1.73_m2} (ref >=60)

## 2022-03-24 LAB — PSA: PSA: 2.95 ng/mL (ref <=4.00)

## 2022-04-19 ENCOUNTER — Other Ambulatory Visit: Payer: Self-pay | Admitting: Family Medicine

## 2022-04-19 ENCOUNTER — Encounter: Payer: Self-pay | Admitting: Family Medicine

## 2022-04-19 ENCOUNTER — Other Ambulatory Visit: Payer: Self-pay

## 2022-04-19 DIAGNOSIS — R351 Nocturia: Secondary | ICD-10-CM

## 2022-04-19 DIAGNOSIS — E119 Type 2 diabetes mellitus without complications: Secondary | ICD-10-CM

## 2022-04-19 MED ORDER — TAMSULOSIN HCL 0.4 MG PO CAPS: 0.4000 mg | ORAL_CAPSULE | Freq: Every day | ORAL | 3 refills | Status: DC

## 2022-04-20 MED ORDER — METFORMIN HCL 500 MG PO TABS: 500.0000 mg | ORAL_TABLET | Freq: Two times a day (BID) | ORAL | 0 refills | Status: DC

## 2022-08-14 ENCOUNTER — Ambulatory Visit (INDEPENDENT_AMBULATORY_CARE_PROVIDER_SITE_OTHER): Payer: Medicare HMO | Admitting: Family Medicine

## 2022-08-14 VITALS — BP 120/62 | HR 106 | Ht 63.0 in | Wt 226.0 lb

## 2022-08-14 DIAGNOSIS — K2100 Gastro-esophageal reflux disease with esophagitis, without bleeding: Secondary | ICD-10-CM

## 2022-08-14 DIAGNOSIS — K2950 Unspecified chronic gastritis without bleeding: Secondary | ICD-10-CM

## 2022-08-14 DIAGNOSIS — K2210 Ulcer of esophagus without bleeding: Secondary | ICD-10-CM

## 2022-08-14 DIAGNOSIS — E119 Type 2 diabetes mellitus without complications: Secondary | ICD-10-CM | POA: Diagnosis not present

## 2022-08-14 DIAGNOSIS — Z6841 Body Mass Index (BMI) 40.0 and over, adult: Secondary | ICD-10-CM | POA: Diagnosis not present

## 2022-08-14 MED ORDER — SUCRALFATE 1 G PO TABS: 1.0000 g | ORAL_TABLET | Freq: Three times a day (TID) | ORAL | 11 refills | Status: DC

## 2022-08-14 MED ORDER — ESOMEPRAZOLE MAGNESIUM 40 MG PO CPDR: 40.0000 mg | DELAYED_RELEASE_CAPSULE | Freq: Every day | ORAL | 11 refills | Status: DC

## 2022-08-14 NOTE — Progress Notes (Signed)
Subjective:    Patient ID: Patrick Marshall, male    DOB: Jan 21, 1946, 77 y.o.   MRN: [id number]  Patient has been dealing with chronic abdominal pain for quite some time.  Please see previous office notes.  He was lost to follow-up as he went back to [place]  [place] he had endoscopy due to his chronic abdominal pain and he was diagnosed with a hiatal hernia per his report.  He was also told that he had an ulcer in the antrum of the stomach and in the duodenal bulb along with chronic gastritis and erosive esophagitis.  He was placed on Nexium twice daily as well as cinitaprida which is available only in [place].  He is here today requesting refills Past Medical History:  Diagnosis Date   Cataract    Diabetes mellitus without complication (HCC)    x few years.   GERD (gastroesophageal reflux disease)    Glaucoma    Gout    Heart disease    HLD (hyperlipidemia)    Hypertension    Past Surgical History:  Procedure Laterality Date   CHOLECYSTECTOMY     EYE SURGERY     2016 both eyes   UPPER GI ENDOSCOPY  2023   Current Outpatient Medications on File Prior to Visit  Medication Sig Dispense Refill   allopurinol (ZYLOPRIM) 100 MG tablet Take 1.5 tablets (150 mg total) by mouth daily. 180 tablet 3   aspirin EC 81 MG tablet Take 81 mg by mouth daily. Swallow whole.     atorvastatin (LIPITOR) 40 MG tablet Take 1 tablet (40 mg total) by mouth daily. 90 tablet 3   famotidine (PEPCID) 20 MG tablet Take 1 tablet (20 mg total) by mouth at bedtime. 30 tablet 1   ketorolac (ACULAR) 0.5 % ophthalmic solution Place 1 drop into the left eye 2 (two) times daily. 5 mL 0   latanoprost (XALATAN) 0.005 % ophthalmic solution Place 1 drop into the left eye daily.     lisinopril (ZESTRIL) 10 MG tablet Take 1 tablet (10 mg total) by mouth daily. 90 tablet 3   metFORMIN (GLUCOPHAGE) 500 MG tablet Take 1 tablet (500 mg total) by mouth 2 (two) times daily. 180 tablet 0    nitroGLYCERIN (NITROSTAT) 0.4 MG SL tablet DISSOLVE ONE TABLET UNDER THE TONGUE EVERY 5 MINUTES AS NEEDED FOR CHEST PAIN.  DO NOT EXCEED A TOTAL OF 3 DOSES IN 15 MINUTES 50 tablet 0   pantoprazole (PROTONIX) 40 MG tablet Take 1 tablet (40 mg total) by mouth daily. 90 tablet 1   tamsulosin (FLOMAX) 0.4 MG CAPS capsule Take 1 capsule (0.4 mg total) by mouth daily. 90 capsule 3   metoCLOPramide (REGLAN) 10 MG tablet Take 1 tablet (10 mg total) by mouth every 6 (six) hours as needed for nausea. PLEASE LABEL IN [facility] (Patient not taking: Reported on 08/14/2022) 90 tablet 1   metoprolol succinate (TOPROL-XL) 25 MG 24 hr tablet Take 0.5 tablets (12.5 mg total) by mouth daily. (Patient not taking: Reported on 08/14/2022) 45 tablet 3   sucralfate (CARAFATE) 1 g tablet Take 1 tablet (1 g total) by mouth 3 (three) times daily before meals. (Patient not taking: Reported on 08/14/2022) 90 tablet 1   No current facility-administered medications on file prior to visit.   Allergies  Allergen Reactions   Penicillins Other (See Comments)    Difficulty sleeping   Social History   Socioeconomic History  Marital status: Married    Spouse name: Not on file   Number of children: 5   Years of education: Not on file   Highest education level: Not on file  Occupational History   Occupation: IT trainer  Tobacco Use   Smoking status: Former    Years: 3    Types: Cigarettes    Quit date: 1993    Years since quitting: 31.4   Smokeless tobacco: Never  Vaping Use   Vaping Use: Never used  Substance and Sexual Activity   Alcohol use: Yes    Comment: occasional   Drug use: No   Sexual activity: Not on file  Other Topics Concern   Not on file  Social History Narrative   Not on file   Social Determinants of Health   Financial Resource Strain: Not on file  Food Insecurity: Not on file  Transportation Needs: Not on file  Physical Activity: Not on file  Stress: Not on file  Social Connections: Not on  file  Intimate Partner Violence: Not on file      Review of Systems  Gastrointestinal:  Positive for abdominal pain.  All other systems reviewed and are negative.      Objective:   Physical Exam Vitals reviewed.  Constitutional:      General: He is not in acute distress.    Appearance: Normal appearance. He is obese. He is not ill-appearing or toxic-appearing.  HENT:     Right Ear: Hearing, tympanic membrane and ear canal normal. There is no impacted cerumen.     Left Ear: Tympanic membrane and ear canal normal. Decreased hearing noted. There is no impacted cerumen.  Neck:     Vascular: No carotid bruit.  Cardiovascular:     Rate and Rhythm: Normal rate and regular rhythm.     Pulses: Normal pulses.     Heart sounds: Normal heart sounds. No murmur heard.    No friction rub. No gallop.  Pulmonary:     Effort: Pulmonary effort is normal. No respiratory distress.     Breath sounds: Normal breath sounds. No stridor. No wheezing, rhonchi or rales.  Chest:     Chest wall: No tenderness.  Abdominal:     General: Abdomen is flat. Bowel sounds are normal. There is no distension.     Palpations: Abdomen is soft.     Tenderness: There is no abdominal tenderness. There is no guarding.  Musculoskeletal:     Right lower leg: No edema.     Left lower leg: No edema.  Neurological:     Mental Status: He is alert.           Assessment & Plan:  Chronic gastritis without bleeding, unspecified gastritis type  Erosive esophagitis  Gastroesophageal reflux disease with esophagitis without hemorrhage Continue Nexium 3 mg twice daily.  Start Carafate 1 g p.o. q. ACH S.  Continue this for 2 months.  Recheck this summer.  If the patient continues to have pain, may refer the patient to discuss surgical options to treat hiatal hernia.  Will be due for fasting lab work to monitor his diabetes at that time.

## 2022-08-16 ENCOUNTER — Other Ambulatory Visit: Payer: Self-pay

## 2022-08-16 DIAGNOSIS — E119 Type 2 diabetes mellitus without complications: Secondary | ICD-10-CM

## 2022-08-16 MED ORDER — METFORMIN HCL 500 MG PO TABS
500.0000 mg | ORAL_TABLET | Freq: Two times a day (BID) | ORAL | 0 refills | Status: DC
Start: 2022-08-16 — End: 2022-10-06

## 2022-08-16 NOTE — Telephone Encounter (Signed)
Prescription Request  08/16/2022  LOV: 08/14/22  What is the name of the medication or equipment? metFORMIN (GLUCOPHAGE) 500 MG tablet [[id number]]  Have you contacted your pharmacy to request a refill? Yes   Which pharmacy would you like this sent to?  [id number] - [place] ([place]), [place] - [address] [place] ([place]) [place] [address] Phone: [phone] Fax: [phone]    Patient notified that their request is being sent to the clinical staff for review and that they should receive a response within 2 business days.   Please advise at [facility]

## 2022-08-16 NOTE — Telephone Encounter (Signed)
Requested Prescriptions  Pending Prescriptions Disp Refills   metFORMIN (GLUCOPHAGE) 500 MG tablet 180 tablet 0    Sig: Take 1 tablet (500 mg total) by mouth 2 (two) times daily.     Endocrinology:  Diabetes - Biguanides Failed - 08/16/2022  4:43 PM      Failed - B12 Level in normal range and within 720 days    No results found for: "VITAMINB12"       Failed - Valid encounter within last 6 months    Recent Outpatient Visits           1 year ago Dyspepsia   [facility] Donita Brooks, MD   1 year ago Elevated alkaline phosphatase level   [facility] Tanya Nones, Priscille Heidelberg, MD   1 year ago Controlled type 2 diabetes mellitus without complication, without long-term current use of insulin (HCC)   [facility] Pickard, Priscille Heidelberg, MD   1 year ago Gastroesophageal reflux disease without esophagitis   [facility] Valentino Nose, NP   1 year ago Type 2 diabetes mellitus without complication, without long-term current use of insulin (HCC)   [facility] Valentino Nose, NP       Future Appointments             In 4 months Pickard, Priscille Heidelberg, MD [facility], [facility]            Passed - Cr in normal range and within 360 days    Creat  Date Value Ref Range Status  03/23/2022 1.24 0.70 - 1.28 mg/dL Final         Passed - HBA1C is between 0 and 7.9 and within 180 days    Hgb A1c MFr Bld  Date Value Ref Range Status  03/23/2022 7.1 (H) <5.7 % of total Hgb Final    Comment:    For someone without known diabetes, a hemoglobin A1c value of 6.5% or greater indicates that they may have  diabetes and this should be confirmed with a follow-up  test. . For someone with known diabetes, a value <7% indicates  that their diabetes is well controlled and a value  greater than or equal to 7% indicates suboptimal  control. A1c targets should be individualized based on  duration of  diabetes, age, comorbid conditions, and  other considerations. . Currently, no consensus exists regarding use of hemoglobin A1c for diagnosis of diabetes for children. .          Passed - eGFR in normal range and within 360 days    GFR, Est African American  Date Value Ref Range Status  08/27/2020 71 > OR = 60 mL/min/1.73m2 Final   GFR, Est Non African American  Date Value Ref Range Status  08/27/2020 61 > OR = 60 mL/min/1.72m2 Final   eGFR  Date Value Ref Range Status  03/23/2022 61 > OR = 60 mL/min/1.71m2 Final         Passed - CBC within normal limits and completed in the last 12 months    WBC  Date Value Ref Range Status  10/18/2021 5.9 3.8 - 10.8 Thousand/uL Final   RBC  Date Value Ref Range Status  10/18/2021 4.66 4.20 - 5.80 Million/uL Final   Hemoglobin  Date Value Ref Range Status  10/18/2021 14.9 13.2 - 17.1 g/dL Final   HCT  Date Value Ref Range Status  10/18/2021 43.3 38.5 -  50.0 % Final   MCHC  Date Value Ref Range Status  10/18/2021 34.4 32.0 - 36.0 g/dL Final   [facility]  Date Value Ref Range Status  10/18/2021 32.0 27.0 - 33.0 pg Final   MCV  Date Value Ref Range Status  10/18/2021 92.9 80.0 - 100.0 fL Final   No results found for: "PLTCOUNTKUC", "LABPLAT", "POCPLA" RDW  Date Value Ref Range Status  10/18/2021 12.3 11.0 - 15.0 % Final

## 2022-08-31 ENCOUNTER — Encounter (HOSPITAL_COMMUNITY): Payer: Self-pay

## 2022-08-31 ENCOUNTER — Emergency Department (HOSPITAL_COMMUNITY)
Admission: EM | Admit: 2022-08-31 | Discharge: 2022-08-31 | Disposition: A | Discharge status: Home / Self Care | Payer: Medicare HMO | Attending: Emergency Medicine | Admitting: Emergency Medicine

## 2022-08-31 ENCOUNTER — Other Ambulatory Visit: Payer: Self-pay

## 2022-08-31 ENCOUNTER — Emergency Department (HOSPITAL_COMMUNITY): Payer: Medicare HMO

## 2022-08-31 DIAGNOSIS — K2100 Gastro-esophageal reflux disease with esophagitis, without bleeding: Secondary | ICD-10-CM | POA: Insufficient documentation

## 2022-08-31 DIAGNOSIS — R079 Chest pain, unspecified: Secondary | ICD-10-CM | POA: Diagnosis not present

## 2022-08-31 DIAGNOSIS — R0602 Shortness of breath: Secondary | ICD-10-CM | POA: Diagnosis not present

## 2022-08-31 DIAGNOSIS — Z7982 Long term (current) use of aspirin: Secondary | ICD-10-CM | POA: Diagnosis not present

## 2022-08-31 DIAGNOSIS — K219 Gastro-esophageal reflux disease without esophagitis: Secondary | ICD-10-CM | POA: Diagnosis not present

## 2022-08-31 DIAGNOSIS — R072 Precordial pain: Secondary | ICD-10-CM

## 2022-08-31 DIAGNOSIS — R0789 Other chest pain: Secondary | ICD-10-CM | POA: Diagnosis not present

## 2022-08-31 LAB — CBC
HCT: 46.2 % (ref 39.0–52.0)
Hemoglobin: 15.1 g/dL (ref 13.0–17.0)
MCH: 30.3 pg (ref 26.0–34.0)
MCHC: 32.7 g/dL (ref 30.0–36.0)
MCV: 92.8 fL (ref 80.0–100.0)
Platelets: 246 10*3/uL (ref 150–400)
RBC: 4.98 MIL/uL (ref 4.22–5.81)
RDW: 14.4 % (ref 11.5–15.5)
WBC: 5.9 10*3/uL (ref 4.0–10.5)
nRBC: 0 % (ref 0.0–0.2)

## 2022-08-31 LAB — COMPREHENSIVE METABOLIC PANEL
ALT: 26 U/L (ref 0–44)
AST: 24 U/L (ref 15–41)
Albumin: 4 g/dL (ref 3.5–5.0)
Alkaline Phosphatase: 69 U/L (ref 38–126)
Anion gap: 10 (ref 5–15)
BUN: 15 mg/dL (ref 8–23)
CO2: 24 mmol/L (ref 22–32)
Calcium: 8.8 mg/dL — ABNORMAL LOW (ref 8.9–10.3)
Chloride: 103 mmol/L (ref 98–111)
Creatinine, Ser: 1.22 mg/dL (ref 0.61–1.24)
GFR, Estimated: >60 mL/min (ref >60)
Glucose, Bld: 143 mg/dL — ABNORMAL HIGH (ref 70–99)
Potassium: 3.8 mmol/L (ref 3.5–5.1)
Sodium: 137 mmol/L (ref 135–145)
Total Bilirubin: 0.7 mg/dL (ref 0.3–1.2)
Total Protein: 7.2 g/dL (ref 6.5–8.1)

## 2022-08-31 LAB — TROPONIN I (HIGH SENSITIVITY): Troponin I (High Sensitivity): 3 ng/L (ref <18)

## 2022-08-31 MED ORDER — FAMOTIDINE 20 MG PO TABS
20.0000 mg | ORAL_TABLET | Freq: Once | ORAL | Status: AC
  Administered 2022-08-31: 20 mg via ORAL
  Filled 2022-08-31: qty 1

## 2022-08-31 MED ORDER — ALUM & MAG HYDROXIDE-SIMETH 200-200-20 MG/5ML PO SUSP
30.0000 mL | Freq: Once | ORAL | Status: AC
  Administered 2022-08-31: 30 mL via ORAL
  Filled 2022-08-31: qty 30

## 2022-08-31 MED ORDER — ACETAMINOPHEN 500 MG PO TABS
1000.0000 mg | ORAL_TABLET | Freq: Once | ORAL | Status: AC
  Administered 2022-08-31: 1000 mg via ORAL
  Filled 2022-08-31: qty 2

## 2022-08-31 NOTE — Discharge Instructions (Addendum)
It was our pleasure to provide your ER care today - we hope that you feel better. Overall, your ER tests look good/normal.   Continue your acid blocker medication.  You may also try maalox, simethicone, or pepcid as need for symptom relief (these meds are available over the counter).   Follow up closely with GI doctor in the next 1-2 weeks - call office to arrange appointment.   For chest discomfort, also follow up closely with cardiologist - a referral was made and they should be contacting you in the next few days.   Return to ER if worse, new symptoms, fevers, recurrent/persistent chest pain, increased trouble breathing, or other concern.

## 2022-08-31 NOTE — ED Provider Notes (Signed)
[place] EMERGENCY DEPARTMENT AT [facility] Provider Note   CSN: [id number] Arrival date & time: 08/31/22  4098     History  Chief Complaint  Patient presents with   Shortness of Breath    Patrick Marshall is a 76 y.o. male.  Patient c/o chest discomfort/burning, sob. States gets burning sensation in pit of his stomach that moves upward into chest, and then causes him to feel sob. Hx gerd. States symptoms for 'years' and more so in past week. Symptoms occur at rest, constant, non radiating, not pleuritic. At times worse after eating. No exertional chest pain or discomfort. No vomiting. No diaphoresis. Denies leg pain or swelling. No orthopnea. Denies new or worsening cough. No fever or chills.   The history is provided by the patient and medical records. A language interpreter was used (AV interpreter service used).  Shortness of Breath Associated symptoms: chest pain   Associated symptoms: no abdominal pain, no cough, no fever, no headaches, no neck pain, no rash, no sore throat and no vomiting        Home Medications Prior to Admission medications   Medication Sig Start Date End Date Taking? Authorizing Provider  allopurinol (ZYLOPRIM) 100 MG tablet Take 1.5 tablets (150 mg total) by mouth daily. 04/11/21   Donita Brooks, MD  aspirin EC 81 MG tablet Take 81 mg by mouth daily. Swallow whole.    [provider]  atorvastatin (LIPITOR) 40 MG tablet Take 1 tablet (40 mg total) by mouth daily. 04/11/21   Donita Brooks, MD  esomeprazole (NEXIUM) 40 MG capsule Take 1 capsule (40 mg total) by mouth daily. 08/14/22   Donita Brooks, MD  ketorolac (ACULAR) 0.5 % ophthalmic solution Place 1 drop into the left eye 2 (two) times daily. 10/18/21   Donita Brooks, MD  latanoprost (XALATAN) 0.005 % ophthalmic solution Place 1 drop into the left eye daily. 05/04/20   [provider]  lisinopril (ZESTRIL) 10 MG tablet Take 1 tablet (10 mg total) by mouth  daily. 10/18/21   Donita Brooks, MD  metFORMIN (GLUCOPHAGE) 500 MG tablet Take 1 tablet (500 mg total) by mouth 2 (two) times daily. 08/16/22   Donita Brooks, MD  nitroGLYCERIN (NITROSTAT) 0.4 MG SL tablet DISSOLVE ONE TABLET UNDER THE TONGUE EVERY 5 MINUTES AS NEEDED FOR CHEST PAIN.  DO NOT EXCEED A TOTAL OF 3 DOSES IN 15 MINUTES 01/02/22   Donita Brooks, MD  sucralfate (CARAFATE) 1 g tablet Take 1 tablet (1 g total) by mouth 4 (four) times daily -  with meals and at bedtime. 08/14/22   Donita Brooks, MD  tamsulosin (FLOMAX) 0.4 MG CAPS capsule Take 1 capsule (0.4 mg total) by mouth daily. 04/19/22   Donita Brooks, MD      Allergies    Penicillins    Review of Systems   Review of Systems  Constitutional:  Negative for fever.  HENT:  Negative for sore throat.   Eyes:  Negative for redness.  Respiratory:  Positive for shortness of breath. Negative for cough.   Cardiovascular:  Positive for chest pain. Negative for palpitations and leg swelling.  Gastrointestinal:  Negative for abdominal pain, diarrhea and vomiting.  Genitourinary:  Negative for dysuria and flank pain.  Musculoskeletal:  Negative for back pain and neck pain.  Skin:  Negative for rash.  Neurological:  Negative for headaches.  Hematological:  Does not bruise/bleed easily.  Psychiatric/Behavioral:  Negative for confusion.  Physical Exam Updated Vital Signs BP 119/82   Pulse 89   Temp 97.6 F (36.4 C) (Oral)   Resp 15   Ht 1.6 m (5\' 3" )   Wt 102 kg   SpO2 96%   BMI 39.83 kg/m  Physical Exam Vitals and nursing note reviewed.  Constitutional:      Appearance: Normal appearance. He is well-developed.  HENT:     Head: Atraumatic.     Nose: Nose normal.     Mouth/Throat:     Mouth: Mucous membranes are moist.     Pharynx: Oropharynx is clear.  Eyes:     General: No scleral icterus.    Conjunctiva/sclera: Conjunctivae normal.  Neck:     Trachea: No tracheal deviation.  Cardiovascular:      Rate and Rhythm: Normal rate and regular rhythm.     Pulses: Normal pulses.     Heart sounds: Normal heart sounds. No murmur heard.    No friction rub. No gallop.  Pulmonary:     Effort: Pulmonary effort is normal. No accessory muscle usage or respiratory distress.     Breath sounds: Normal breath sounds.  Abdominal:     General: Bowel sounds are normal. There is no distension.     Palpations: Abdomen is soft. There is no mass.     Tenderness: There is no abdominal tenderness. There is no guarding.  Genitourinary:    Comments: No cva tenderness. Musculoskeletal:        General: No swelling or tenderness.     Cervical back: Normal range of motion and neck supple. No rigidity.     Right lower leg: No edema.     Left lower leg: No edema.  Skin:    General: Skin is warm and dry.     Findings: No rash.  Neurological:     Mental Status: He is alert.     Comments: Alert, speech clear.   Psychiatric:        Mood and Affect: Mood normal.     ED Results / Procedures / Treatments   Labs (all labs ordered are listed, but only abnormal results are displayed) Results for orders placed or performed during the hospital encounter of 08/31/22  Comprehensive metabolic panel  Result Value Ref Range   Sodium 137 135 - 145 mmol/L   Potassium 3.8 3.5 - 5.1 mmol/L   Chloride 103 98 - 111 mmol/L   CO2 24 22 - 32 mmol/L   Glucose, Bld 143 (H) 70 - 99 mg/dL   BUN 15 8 - 23 mg/dL   Creatinine, Ser 1.61 0.61 - 1.24 mg/dL   Calcium 8.8 (L) 8.9 - 10.3 mg/dL   Total Protein 7.2 6.5 - 8.1 g/dL   Albumin 4.0 3.5 - 5.0 g/dL   AST 24 15 - 41 U/L   ALT 26 0 - 44 U/L   Alkaline Phosphatase 69 38 - 126 U/L   Total Bilirubin 0.7 0.3 - 1.2 mg/dL   GFR, Estimated >09 >60 mL/min   Anion gap 10 5 - 15  CBC  Result Value Ref Range   WBC 5.9 4.0 - 10.5 K/uL   RBC 4.98 4.22 - 5.81 MIL/uL   Hemoglobin 15.1 13.0 - 17.0 g/dL   HCT 45.4 09.8 - 11.9 %   MCV 92.8 80.0 - 100.0 fL   MCH 30.3 26.0 - 34.0 pg    MCHC 32.7 30.0 - 36.0 g/dL   RDW 14.7 82.9 - 56.2 %   Platelets 246  150 - 400 K/uL   nRBC 0.0 0.0 - 0.2 %  Troponin I (High Sensitivity)  Result Value Ref Range   Troponin I (High Sensitivity) 3 <18 ng/L     EKG EKG Interpretation  Date/Time:  Thursday August 31 2022 08:49:11 EDT Ventricular Rate:  81 PR Interval:  165 QRS Duration: 92 QT Interval:  364 QTC Calculation: 423 R Axis:   -27 Text Interpretation: Sinus rhythm Borderline left axis deviation Confirmed by Cathren Laine ([id number]) on 08/31/2022 8:51:37 AM  Radiology DG Chest Port 1 View  Result Date: 08/31/2022 CLINICAL DATA:  Pain.  Shortness of breath EXAM: PORTABLE CHEST 1 VIEW COMPARISON:  None Available. FINDINGS: The right inferior costophrenic angle is clipped off the edge of the film. Film is under penetrated. Normal cardiopericardial silhouette. No consolidation, pneumothorax, effusion or edema. Overlapping cardiac leads. IMPRESSION: No acute cardiopulmonary disease Electronically Signed   By: Karen Kays M.D.   On: 08/31/2022 09:53    Procedures Procedures    Medications Ordered in ED Medications  alum & mag hydroxide-simeth (MAALOX/MYLANTA) 200-200-20 MG/5ML suspension 30 mL (30 mLs Oral Given 08/31/22 0947)  famotidine (PEPCID) tablet 20 mg (20 mg Oral Given 08/31/22 0947)  acetaminophen (TYLENOL) tablet 1,000 mg (1,000 mg Oral Given 08/31/22 0947)    ED Course/ Medical Decision Making/ A&P                             Medical Decision Making Problems Addressed: Gastroesophageal reflux disease with esophagitis without hemorrhage: chronic illness or injury with exacerbation, progression, or side effects of treatment that poses a threat to life or bodily functions    Details: Acute/chronic Precordial chest pain: acute illness or injury with systemic symptoms that poses a threat to life or bodily functions  Amount and/or Complexity of Data Reviewed External Data Reviewed: notes. Labs: ordered. Decision-making  details documented in ED Course. Radiology: ordered and independent interpretation performed. Decision-making details documented in ED Course. ECG/medicine tests: ordered and independent interpretation performed. Decision-making details documented in ED Course.  Risk OTC drugs. Decision regarding hospitalization.   Iv ns. Continuous pulse ox and cardiac monitoring. Labs ordered/sent. Imaging ordered.   Differential diagnosis includes ACS, GI cp, msk cp, etc. Dispo decision including potential need for admission considered - will get labs and imaging and reassess.   Reviewed nursing notes and prior charts for additional history. External reports reviewed.   Acetaminophen, pepcid and maalox for symptom relief.   Cardiac monitor: sinus rhythm, rate 80.  Labs reviewed/interpreted by me - wbc and hgb normal. Trop normal - after symptoms constant for days, felt not c/w acs.   Xrays reviewed/interpreted by me - no pna.  Recheck pt comfortable, no distress. No increased wob, rr 14, pulse ox 98% rom air.   Pt currently appears stable for d/c.   Rec gi f/u. Also rec cardiology f/u re atypical cp.   Return precautions provided.           Final Clinical Impression(s) / ED Diagnoses Final diagnoses:  Precordial chest pain  Gastroesophageal reflux disease with esophagitis without hemorrhage    Rx / DC Orders ED Discharge Orders          Ordered    Ambulatory referral to Cardiology       Comments: If you have not heard from the Cardiology office within the next 72 hours please call [phone].   08/31/22 1036  Cathren Laine, MD 08/31/22 1037

## 2022-08-31 NOTE — ED Triage Notes (Signed)
Patient presents to ER for shortness of breath. Patient states he has been feeling like this for 2 weeks, today is the worst. Patient states "there is no air in my lungs."  VSS, NAD.   Patient brought imaging from previous endoscopy that he states is part of why he can't breathe.

## 2022-09-05 ENCOUNTER — Telehealth: Payer: Self-pay

## 2022-09-05 NOTE — Telephone Encounter (Signed)
Transition Care Management Unsuccessful Follow-up Telephone Call  Date of discharge and from where:  [facility]   Attempts:  1st Attempt  Reason for unsuccessful TCM follow-up call:  Unable to leave message   Lenard Forth [facility] Guide, [facility] [phone] [address] [place], [place], [place] [address] Phone: [phone] Email: [email or web address]

## 2022-09-06 ENCOUNTER — Telehealth: Payer: Self-pay

## 2022-09-06 NOTE — Telephone Encounter (Signed)
Transition Care Management Unsuccessful Follow-up Telephone Call  Date of discharge and from where:  [facility] 6/6  Attempts:  2nd Attempt  Reason for unsuccessful TCM follow-up call:  Unable to leave message   Lenard Forth [facility] Guide, [facility] [phone] [address] [place], [place], [place] [address] Phone: [phone] Email: [email or web address]

## 2022-09-08 ENCOUNTER — Ambulatory Visit: Payer: Medicare HMO | Attending: Physician Assistant | Admitting: Physician Assistant

## 2022-09-08 ENCOUNTER — Encounter: Payer: Self-pay | Admitting: Physician Assistant

## 2022-09-08 VITALS — BP 120/90 | HR 72 | Ht 63.0 in | Wt 223.4 lb

## 2022-09-08 DIAGNOSIS — E785 Hyperlipidemia, unspecified: Secondary | ICD-10-CM | POA: Diagnosis not present

## 2022-09-08 DIAGNOSIS — I10 Essential (primary) hypertension: Secondary | ICD-10-CM | POA: Diagnosis not present

## 2022-09-08 DIAGNOSIS — R0602 Shortness of breath: Secondary | ICD-10-CM | POA: Diagnosis not present

## 2022-09-08 DIAGNOSIS — R079 Chest pain, unspecified: Secondary | ICD-10-CM

## 2022-09-08 NOTE — Patient Instructions (Addendum)
Medication Instructions:   Your physician recommends that you continue on your current medications as directed. Please refer to the Current Medication list given to you today.   *If you need a refill on your cardiac medications before your next appointment, please call your pharmacy*   Lab Work: NONE ORDERED  TODAY   If you have labs (blood work) drawn today and your tests are completely normal, you will receive your results only by: MyChart Message (if you have MyChart) OR A paper copy in the mail If you have any lab test that is abnormal or we need to change your treatment, we will call you to review the results.   Testing/Procedures:  Your physician has requested that you have an echocardiogram. Echocardiography is a painless test that uses sound waves to create images of your heart. It provides your doctor with information about the size and shape of your heart and how well your heart's chambers and valves are working. This procedure takes approximately one hour. There are no restrictions for this procedure. Please do NOT wear cologne, perfume, aftershave, or lotions (deodorant is allowed). Please arrive 15 minutes prior to your appointment time.     Follow-Up: At [facility], you and your health needs are our priority.  As part of our continuing mission to provide you with exceptional heart care, we have created designated Provider Care Teams.  These Care Teams include your primary Cardiologist (physician) and Advanced Practice Providers (APPs -  Physician Assistants and Nurse Practitioners) who all work together to provide you with the care you need, when you need it.  We recommend signing up for the patient portal called "MyChart".  Sign up information is provided on this After Visit Summary.  MyChart is used to connect with patients for Virtual Visits (Telemedicine).  Patients are able to view lab/test results, encounter notes, upcoming appointments, etc.  Non-urgent  messages can be sent to your provider as well.   To learn more about what you can do with MyChart, go to [email or web address].    Your next appointment:   3 month(s)  Provider:   Jodelle Red, MD     Other Instructions  Heart-Healthy Eating Plan Eating a healthy diet is important for the health of your heart. A heart-healthy eating plan includes: Eating less unhealthy fats. Eating more healthy fats. Eating less salt in your food. Salt is also called sodium. Making other changes in your diet. Talk with your doctor or a diet specialist (dietitian) to create an eating plan that is right for you. What is my plan? Your doctor may recommend an eating plan that includes: Total fat: ______% or less of total calories a day. Saturated fat: ______% or less of total calories a day. Cholesterol: less than _________mg a day. Sodium: less than _________mg a day. What are tips for following this plan? Cooking Avoid frying your food. Try to bake, boil, grill, or broil it instead. You can also reduce fat by: Removing the skin from poultry. Removing all visible fats from meats. Steaming vegetables in water or broth. Meal planning  At meals, divide your plate into four equal parts: Fill one-half of your plate with vegetables and green salads. Fill one-fourth of your plate with whole grains. Fill one-fourth of your plate with lean protein foods. Eat 2-4 cups of vegetables per day. One cup of vegetables is: 1 cup (91 g) broccoli or cauliflower florets. 2 medium carrots. 1 large bell pepper. 1 large sweet potato. 1 large tomato. 1  medium white potato. 2 cups (150 g) raw leafy greens. Eat 1-2 cups of fruit per day. One cup of fruit is: 1 small apple 1 large banana 1 cup (237 g) mixed fruit, 1 large orange,  cup (82 g) dried fruit, 1 cup (240 mL) 100% fruit juice. Eat more foods that have soluble fiber. These are apples, broccoli, carrots, beans, peas, and barley. Try to get  20-30 g of fiber per day. Eat 4-5 servings of nuts, legumes, and seeds per week: 1 serving of dried beans or legumes equals  cup (90 g) cooked. 1 serving of nuts is  oz (12 almonds, 24 pistachios, or 7 walnut halves). 1 serving of seeds equals  oz (8 g). General information Eat more home-cooked food. Eat less restaurant, buffet, and fast food. Limit or avoid alcohol. Limit foods that are high in starch and sugar. Avoid fried foods. Lose weight if you are overweight. Keep track of how much salt (sodium) you eat. This is important if you have high blood pressure. Ask your doctor to tell you more about this. Try to add vegetarian meals each week. Fats Choose healthy fats. These include olive oil and canola oil, flaxseeds, walnuts, almonds, and seeds. Eat more omega-3 fats. These include salmon, mackerel, sardines, tuna, flaxseed oil, and ground flaxseeds. Try to eat fish at least 2 times each week. Check food labels. Avoid foods with trans fats or high amounts of saturated fat. Limit saturated fats. These are often found in animal products, such as meats, butter, and cream. These are also found in plant foods, such as palm oil, palm kernel oil, and coconut oil. Avoid foods with partially hydrogenated oils in them. These have trans fats. Examples are stick margarine, some tub margarines, cookies, crackers, and other baked goods. What foods should I eat? Fruits All fresh, canned (in natural juice), or frozen fruits. Vegetables Fresh or frozen vegetables (raw, steamed, roasted, or grilled). Green salads. Grains Most grains. Choose whole wheat and whole grains most of the time. Rice and pasta, including brown rice and pastas made with whole wheat. Meats and other proteins Lean, well-trimmed beef, veal, pork, and lamb. Chicken and [place] without skin. All fish and shellfish. Wild duck, rabbit, pheasant, and venison. Egg whites or low-cholesterol egg substitutes. Dried beans, peas, lentils,  and tofu. Seeds and most nuts. Dairy Low-fat or nonfat cheeses, including ricotta and mozzarella. Skim or 1% milk that is liquid, powdered, or evaporated. Buttermilk that is made with low-fat milk. Nonfat or low-fat yogurt. Fats and oils Non-hydrogenated (trans-free) margarines. Vegetable oils, including soybean, sesame, sunflower, olive, peanut, safflower, corn, canola, and cottonseed. Salad dressings or mayonnaise made with a vegetable oil. Beverages Mineral water. Coffee and tea. Diet carbonated beverages. Sweets and desserts Sherbet, gelatin, and fruit ice. Small amounts of dark chocolate. Limit all sweets and desserts. Seasonings and condiments All seasonings and condiments. The items listed above may not be a complete list of foods and drinks you can eat. Contact a dietitian for more options. What foods should I avoid? Fruits Canned fruit in heavy syrup. Fruit in cream or butter sauce. Fried fruit. Limit coconut. Vegetables Vegetables cooked in cheese, cream, or butter sauce. Fried vegetables. Grains Breads that are made with saturated or trans fats, oils, or whole milk. Croissants. Sweet rolls. Donuts. High-fat crackers, such as cheese crackers. Meats and other proteins Fatty meats, such as hot dogs, ribs, sausage, bacon, rib-eye roast or steak. High-fat deli meats, such as salami and bologna. Caviar. Domestic duck and  goose. Organ meats, such as liver. Dairy Cream, sour cream, cream cheese, and creamed cottage cheese. Whole-milk cheeses. Whole or 2% milk that is liquid, evaporated, or condensed. Whole buttermilk. Cream sauce or high-fat cheese sauce. Yogurt that is made from whole milk. Fats and oils Meat fat, or shortening. Cocoa butter, hydrogenated oils, palm oil, coconut oil, palm kernel oil. Solid fats and shortenings, including bacon fat, salt pork, lard, and butter. Nondairy cream substitutes. Salad dressings with cheese or sour cream. Beverages Regular sodas and juice  drinks with added sugar. Sweets and desserts Frosting. Pudding. Cookies. Cakes. Pies. Milk chocolate or white chocolate. Buttered syrups. Full-fat ice cream or ice cream drinks. The items listed above may not be a complete list of foods and drinks to avoid. Contact a dietitian for more information. Summary Heart-healthy meal planning includes eating less unhealthy fats, eating more healthy fats, and making other changes in your diet. Eat a balanced diet. This includes fruits and vegetables, low-fat or nonfat dairy, lean protein, nuts and legumes, whole grains, and heart-healthy oils and fats. This information is not intended to replace advice given to you by your health care provider. Make sure you discuss any questions you have with your health care provider. Document Revised: 04/18/2021 Document Reviewed: 04/18/2021 Elsevier Patient Education  2024 Elsevier Inc.   Low-Sodium Eating Plan Salt (sodium) helps you keep a healthy balance of fluids in your body. Too much sodium can raise your blood pressure. It can also cause fluid and waste to be held in your body. Your health care provider or dietitian may recommend a low-sodium eating plan if you have high blood pressure (hypertension), kidney disease, liver disease, or heart failure. Eating less sodium can help lower your blood pressure and reduce swelling. It can also protect your heart, liver, and kidneys. What are tips for following this plan? Reading food labels  Check food labels for the amount of sodium per serving. If you eat more than one serving, you must multiply the listed amount by the number of servings. Choose foods with less than 140 milligrams (mg) of sodium per serving. Avoid foods with 300 mg of sodium or more per serving. Always check how much sodium is in a product, even if the label says "unsalted" or "no salt added." Shopping  Buy products labeled as "low-sodium" or "no salt added." Buy fresh foods. Avoid canned foods  and pre-made or frozen meals. Avoid canned, cured, or processed meats. Buy breads that have less than 80 mg of sodium per slice. Cooking  Eat more home-cooked food. Try to eat less restaurant, buffet, and fast food. Try not to add salt when you cook. Use salt-free seasonings or herbs instead of table salt or sea salt. Check with your provider or pharmacist before using salt substitutes. Cook with plant-based oils, such as canola, sunflower, or olive oil. Meal planning When eating at a restaurant, ask if your food can be made with less salt or no salt. Avoid dishes labeled as brined, pickled, cured, or smoked. Avoid dishes made with soy sauce, miso, or teriyaki sauce. Avoid foods that have monosodium glutamate (MSG) in them. MSG may be added to some restaurant food, sauces, soups, bouillon, and canned foods. Make meals that can be grilled, baked, poached, roasted, or steamed. These are often made with less sodium. General information Try to limit your sodium intake to 1,500-2,300 mg each day, or the amount told by your provider. What foods should I eat? Fruits Fresh, frozen, or canned fruit. Fruit  juice. Vegetables Fresh or frozen vegetables. "No salt added" canned vegetables. "No salt added" tomato sauce and paste. Low-sodium or reduced-sodium tomato and vegetable juice. Grains Low-sodium cereals, such as oats, puffed wheat and rice, and shredded wheat. Low-sodium crackers. Unsalted rice. Unsalted pasta. Low-sodium bread. Whole grain breads and whole grain pasta. Meats and other proteins Fresh or frozen meat, poultry, seafood, and fish. These should have no added salt. Low-sodium canned tuna and salmon. Unsalted nuts. Dried peas, beans, and lentils without added salt. Unsalted canned beans. Eggs. Unsalted nut butters. Dairy Milk. Soy milk. Cheese that is naturally low in sodium, such as ricotta cheese, fresh mozzarella, or Swiss cheese. Low-sodium or reduced-sodium cheese. Cream cheese.  Yogurt. Seasonings and condiments Fresh and dried herbs and spices. Salt-free seasonings. Low-sodium mustard and ketchup. Sodium-free salad dressing. Sodium-free light mayonnaise. Fresh or refrigerated horseradish. Lemon juice. Vinegar. Other foods Homemade, reduced-sodium, or low-sodium soups. Unsalted popcorn and pretzels. Low-salt or salt-free chips. The items listed above may not be all the foods and drinks you can have. Talk to a dietitian to learn more. What foods should I avoid? Vegetables Sauerkraut, pickled vegetables, and relishes. Olives. [place] fries. Onion rings. Regular canned vegetables, except low-sodium or reduced-sodium items. Regular canned tomato sauce and paste. Regular tomato and vegetable juice. Frozen vegetables in sauces. Grains Instant hot cereals. Bread stuffing, pancake, and biscuit mixes. Croutons. Seasoned rice or pasta mixes. Noodle soup cups. Boxed or frozen macaroni and cheese. Regular salted crackers. Self-rising flour. Meats and other proteins Meat or fish that is salted, canned, smoked, spiced, or pickled. Precooked or cured meat, such as sausages or meat loaves. Tomasa Blase. Ham. Pepperoni. Hot dogs. Corned beef. Chipped beef. Salt pork. Jerky. Pickled herring, anchovies, and sardines. Regular canned tuna. Salted nuts. Dairy Processed cheese and cheese spreads. Hard cheeses. Cheese curds. Blue cheese. Feta cheese. String cheese. Regular cottage cheese. Buttermilk. Canned milk. Fats and oils Salted butter. Regular margarine. Ghee. Bacon fat. Seasonings and condiments Onion salt, garlic salt, seasoned salt, table salt, and sea salt. Canned and packaged gravies. Worcestershire sauce. Tartar sauce. Barbecue sauce. Teriyaki sauce. Soy sauce, including reduced-sodium soy sauce. Steak sauce. Fish sauce. Oyster sauce. Cocktail sauce. Horseradish that you find on the shelf. Regular ketchup and mustard. Meat flavorings and tenderizers. Bouillon cubes. Hot sauce. Pre-made or  packaged marinades. Pre-made or packaged taco seasonings. Relishes. Regular salad dressings. Salsa. Other foods Salted popcorn and pretzels. Corn chips and puffs. Potato and tortilla chips. Canned or dried soups. Pizza. Frozen entrees and pot pies. The items listed above may not be all the foods and drinks you should avoid. Talk to a dietitian to learn more. This information is not intended to replace advice given to you by your health care provider. Make sure you discuss any questions you have with your health care provider. Document Revised: 03/30/2022 Document Reviewed: 03/30/2022 Elsevier Patient Education  2024 [employer].

## 2022-09-08 NOTE — Progress Notes (Signed)
Cardiology Office Note:  .   Date:  09/08/2022  ID:  Patrick Marshall, DOB 08-13-1945, MRN [id number] PCP: Donita Brooks, MD   HeartCare Providers Cardiologist:  Jodelle Red, MD }   History of Present Illness: .   Patrick Marshall is a 77 y.o. male with a past medical history of hyperlipidemia, SOB, morbid obesity, hypertension, type 2 diabetes mellitus who presents today for hospital follow-up.  Patient presented to [facility] 08/31/2022 with complaint of chest discomfort/burning, shortness of breath.  Patient was getting a burning sensation in the pit of his stomach that moved into his chest that was causing him to feel short of breath.  He does have a history of GERD.  States that he has had symptoms for years but more so in the past week.  Symptoms occurred at rest, constant, nonradiating, nonpleuritic.  At times worse after eating.  No exertional chest pain or discomfort.  No vomiting.  No diaphoresis.  Denied leg pain or swelling.  No orthopnea.  EKG showed normal sinus rhythm, rate 81 bpm.  No ST changes.  Troponin was normal.  Chest pain thought to be GI in nature.  He is on Nexium and Carafate.  Today, he tells me that he is still having some tightness is in chest around his heart. He always feels the pain no matter what. He is not sure if its his stomach or not. He is also having some associated SOB. When asked where the pain is, he points to his epigastric region and then radiates to his left chest and up his esophagus. He says it burns sometimes as well. He is taking his Nexium and Carafate without any relief. He does have an appointment with his PCP but its not until September.  Since he is having some associated shortness of breath, we can update an echocardiogram today.  Reviewed his last echocardiogram from August of last year. Lots of education on cardiac pain vs. GERD. GERD handout provided today.   Dorene Grebe his daughter helped with interruption since  our machine was not working properly.     ROS: Please see HPI for pertinent ROS  Studies Reviewed: Marland Kitchen    EKG: None ordered today.  EKG from 08/31/2022 reviewed.   Echocardiogram 11/08/2021 IMPRESSIONS     1. Left ventricular ejection fraction, by estimation, is 55 to 60%. The  left ventricle has normal function. The left ventricle has no regional  wall motion abnormalities. Left ventricular diastolic parameters were  normal.   2. Right ventricular systolic function is normal. The right ventricular  size is normal. There is normal pulmonary artery systolic pressure.   3. The mitral valve is abnormal. Trivial mitral valve regurgitation. No  evidence of mitral stenosis.   4. The aortic valve is tricuspid. There is mild calcification of the  aortic valve. Aortic valve regurgitation is not visualized. Aortic valve  sclerosis is present, with no evidence of aortic valve stenosis.   5. Aortic dilatation noted. There is mild dilatation of the ascending  aorta, measuring 39 mm.   6. The inferior vena cava is normal in size with greater than 50%  respiratory variability, suggesting right atrial pressure of 3 mmHg.   FINDINGS   Left Ventricle: Left ventricular ejection fraction, by estimation, is 55  to 60%. The left ventricle has normal function. The left ventricle has no  regional wall motion abnormalities. The left ventricular internal cavity  size was normal in size. There is  no left ventricular hypertrophy. Left ventricular diastolic parameters  were normal.   Right Ventricle: The right ventricular size is normal. No increase in  right ventricular wall thickness. Right ventricular systolic function is  normal. There is normal pulmonary artery systolic pressure. The tricuspid  regurgitant velocity is 2.49 m/s, and   with an assumed right atrial pressure of 3 mmHg, the estimated right  ventricular systolic pressure is 27.8 mmHg.   Left Atrium: Left atrial size was normal in size.    Right Atrium: Right atrial size was normal in size.   Pericardium: There is no evidence of pericardial effusion.   Mitral Valve: The mitral valve is abnormal. There is mild thickening of  the mitral valve leaflet(s). Trivial mitral valve regurgitation. No  evidence of mitral valve stenosis.   Tricuspid Valve: The tricuspid valve is normal in structure. Tricuspid  valve regurgitation is mild . No evidence of tricuspid stenosis.   Aortic Valve: The aortic valve is tricuspid. There is mild calcification  of the aortic valve. Aortic valve regurgitation is not visualized. Aortic  valve sclerosis is present, with no evidence of aortic valve stenosis.   Pulmonic Valve: The pulmonic valve was normal in structure. Pulmonic valve  regurgitation is not visualized. No evidence of pulmonic stenosis.   Aorta: Aortic dilatation noted. There is mild dilatation of the ascending  aorta, measuring 39 mm.   Venous: The inferior vena cava is normal in size with greater than 50%  respiratory variability, suggesting right atrial pressure of 3 mmHg.   IAS/Shunts: No atrial level shunt detected by color flow Doppler.   Physical Exam:   VS:  There were no vitals taken for this visit.   Wt Readings from Last 3 Encounters:  08/31/22 224 lb 13.9 oz (102 kg)  08/14/22 226 lb (102.5 kg)  03/23/22 228 lb 3.2 oz (103.5 kg)    GEN: Well nourished, well developed in no acute distress NECK: No JVD; No carotid bruits CARDIAC: RRR, no murmurs, rubs, gallops RESPIRATORY:  Clear to auscultation without rales, wheezing or rhonchi  ABDOMEN: Soft, non-tender, non-distended EXTREMITIES:  No edema; No deformity   ASSESSMENT AND PLAN: .   Chest pain -Nonexertional, negative troponin and EKG when evaluated in the ER -Likely related to GERD, taking Carafate and Nexium at this moment -He is also having some associated shortness of breath so we did order an echocardiogram today -We have encouraged him to follow-up  with his primary care -Education on GERD diet was provided today in Spanish  Hypertension -Blood pressure with diastolic slightly elevated today -Continue to track blood pressure at home -Continue lisinopril 10 mg daily  Hyperlipidemia  -Last LDL 38 (10/18/2021) at goal -He will need to follow-up lipid panel at his next visit      Dispo: Please follow-up in 3 months with Dr. Cristal Deer.  Please follow-up with your primary care ASAP.   Signed, Sharlene Dory, PA-C

## 2022-09-17 ENCOUNTER — Other Ambulatory Visit: Payer: Self-pay | Admitting: Family Medicine

## 2022-10-03 ENCOUNTER — Other Ambulatory Visit: Payer: Self-pay | Admitting: Family Medicine

## 2022-10-03 DIAGNOSIS — E785 Hyperlipidemia, unspecified: Secondary | ICD-10-CM

## 2022-10-03 NOTE — Telephone Encounter (Signed)
Prescription Request  10/03/2022  LOV: 08/14/2022  What is the name of the medication or equipment? atorvastatin (LIPITOR) 40 MG tablet   Have you contacted your pharmacy to request a refill? Yes   Which pharmacy would you like this sent to?  [id number] - [place] ([place]), [place] - [address] [place] [address] Phone: [phone] Fax: [phone]    Patient notified that their request is being sent to the clinical staff for review and that they should receive a response within 2 business days.   Please advise at [facility]

## 2022-10-04 MED ORDER — ATORVASTATIN CALCIUM 40 MG PO TABS
40.0000 mg | ORAL_TABLET | Freq: Every day | ORAL | 0 refills | Status: DC
Start: 2022-10-04 — End: 2022-12-08

## 2022-10-04 NOTE — Telephone Encounter (Signed)
Last OV 08/14/22, within protocol.  Requested Prescriptions  Pending Prescriptions Disp Refills   atorvastatin (LIPITOR) 40 MG tablet 90 tablet 0    Sig: Take 1 tablet (40 mg total) by mouth daily.     Cardiovascular:  Antilipid - Statins Failed - 10/03/2022  3:55 PM      Failed - Valid encounter within last 12 months    Recent Outpatient Visits           1 year ago Dyspepsia   [facility] Pickard, Priscille Heidelberg, MD   1 year ago Elevated alkaline phosphatase level   [facility] Tanya Nones, Priscille Heidelberg, MD   1 year ago Controlled type 2 diabetes mellitus without complication, without long-term current use of insulin (HCC)   [facility] Tanya Nones, Priscille Heidelberg, MD   1 year ago Gastroesophageal reflux disease without esophagitis   [facility] Valentino Nose, NP   2 years ago Type 2 diabetes mellitus without complication, without long-term current use of insulin (HCC)   [facility] Valentino Nose, NP       Future Appointments             In 2 months Jodelle Red, MD [facility], [place]   In 2 months Tanya Nones, Priscille Heidelberg, MD [facility], [facility]            Failed - Lipid Panel in normal range within the last 12 months    Cholesterol  Date Value Ref Range Status  10/18/2021 113 <200 mg/dL Final   LDL Cholesterol (Calc)  Date Value Ref Range Status  10/18/2021 38 mg/dL (calc) Final    Comment:    Reference range: <100 . Desirable range <100 mg/dL for primary prevention;   <70 mg/dL for patients with CHD or diabetic patients  with > or = 2 CHD risk factors. Marland Kitchen LDL-C is now calculated using the Martin-Hopkins  calculation, which is a validated novel method providing  better accuracy than the Friedewald equation in the  estimation of LDL-C.  Horald Pollen et al. Lenox Ahr. 1610;960(45): 2061-2068   (http://education.[employer].com/faq/FAQ164)    HDL  Date Value Ref Range Status  10/18/2021 55 > OR = 40 mg/dL Final   Triglycerides  Date Value Ref Range Status  10/18/2021 115 <150 mg/dL Final         Passed - Patient is not pregnant

## 2022-10-06 ENCOUNTER — Other Ambulatory Visit: Payer: Self-pay | Admitting: Family Medicine

## 2022-10-06 ENCOUNTER — Encounter (HOSPITAL_COMMUNITY): Payer: Self-pay | Admitting: Physician Assistant

## 2022-10-06 ENCOUNTER — Ambulatory Visit (HOSPITAL_COMMUNITY): Payer: Medicare HMO | Attending: Physician Assistant

## 2022-10-06 ENCOUNTER — Other Ambulatory Visit: Payer: Self-pay

## 2022-10-06 DIAGNOSIS — E119 Type 2 diabetes mellitus without complications: Secondary | ICD-10-CM

## 2022-10-06 NOTE — Telephone Encounter (Signed)
Prescription Request  10/06/2022  LOV: 08/14/2022  What is the name of the medication or equipment?   metFORMIN (GLUCOPHAGE) 500 MG tablet   Have you contacted your pharmacy to request a refill? Yes   Which pharmacy would you like this sent to?  [id number] - [place] ([place]), [place] - [address] [place] ([place]) [place] [address] Phone: [phone] Fax: [phone]    Patient notified that their request is being sent to the clinical staff for review and that they should receive a response within 2 business days.   Please advise pharmacist.

## 2022-10-08 ENCOUNTER — Other Ambulatory Visit: Payer: Self-pay | Admitting: Family Medicine

## 2022-10-09 MED ORDER — METFORMIN HCL 500 MG PO TABS
500.0000 mg | ORAL_TABLET | Freq: Two times a day (BID) | ORAL | 0 refills | Status: DC
Start: 2022-10-09 — End: 2023-02-27

## 2022-10-09 NOTE — Telephone Encounter (Signed)
Requested Prescriptions  Pending Prescriptions Disp Refills   metFORMIN (GLUCOPHAGE) 500 MG tablet 180 tablet 0    Sig: Take 1 tablet (500 mg total) by mouth 2 (two) times daily.     Endocrinology:  Diabetes - Biguanides Failed - 10/06/2022  4:54 PM      Failed - HBA1C is between 0 and 7.9 and within 180 days    Hgb A1c MFr Bld  Date Value Ref Range Status  03/23/2022 7.1 (H) <5.7 % of total Hgb Final    Comment:    For someone without known diabetes, a hemoglobin A1c value of 6.5% or greater indicates that they may have  diabetes and this should be confirmed with a follow-up  test. . For someone with known diabetes, a value <7% indicates  that their diabetes is well controlled and a value  greater than or equal to 7% indicates suboptimal  control. A1c targets should be individualized based on  duration of diabetes, age, comorbid conditions, and  other considerations. . Currently, no consensus exists regarding use of hemoglobin A1c for diagnosis of diabetes for children. .          Failed - B12 Level in normal range and within 720 days    No results found for: "VITAMINB12"       Failed - Valid encounter within last 6 months    Recent Outpatient Visits           1 year ago Dyspepsia   [facility] Tanya Nones, Priscille Heidelberg, MD   1 year ago Elevated alkaline phosphatase level   [facility] Tanya Nones, Priscille Heidelberg, MD   1 year ago Controlled type 2 diabetes mellitus without complication, without long-term current use of insulin (HCC)   [facility] Tanya Nones, Priscille Heidelberg, MD   1 year ago Gastroesophageal reflux disease without esophagitis   [facility] Valentino Nose, NP   2 years ago Type 2 diabetes mellitus without complication, without long-term current use of insulin (HCC)   [facility] Valentino Nose, NP       Future Appointments             In 2 months Jodelle Red, MD [facility] at [facility], [place]   In 2 months Pickard, Priscille Heidelberg, MD [facility]            Passed - Cr in normal range and within 360 days    Creat  Date Value Ref Range Status  03/23/2022 1.24 0.70 - 1.28 mg/dL Final   Creatinine, Ser  Date Value Ref Range Status  08/31/2022 1.22 0.61 - 1.24 mg/dL Final         Passed - eGFR in normal range and within 360 days    GFR, Est African American  Date Value Ref Range Status  08/27/2020 71 > OR = 60 mL/min/1.11m2 Final   GFR, Est Non African American  Date Value Ref Range Status  08/27/2020 61 > OR = 60 mL/min/1.51m2 Final   GFR, Estimated  Date Value Ref Range Status  08/31/2022 >60 >60 mL/min Final    Comment:    (NOTE) Calculated using the CKD-EPI Creatinine Equation (2021)    eGFR  Date Value Ref Range Status  03/23/2022 61 > OR = 60 mL/min/1.73m2 Final         Passed - CBC within normal limits and completed in the last 12  months    WBC  Date Value Ref Range Status  08/31/2022 5.9 4.0 - 10.5 K/uL Final   RBC  Date Value Ref Range Status  08/31/2022 4.98 4.22 - 5.81 MIL/uL Final   Hemoglobin  Date Value Ref Range Status  08/31/2022 15.1 13.0 - 17.0 g/dL Final   HCT  Date Value Ref Range Status  08/31/2022 46.2 39.0 - 52.0 % Final   MCHC  Date Value Ref Range Status  08/31/2022 32.7 30.0 - 36.0 g/dL Final   [facility]  Date Value Ref Range Status  08/31/2022 30.3 26.0 - 34.0 pg Final   MCV  Date Value Ref Range Status  08/31/2022 92.8 80.0 - 100.0 fL Final   No results found for: "PLTCOUNTKUC", "LABPLAT", "POCPLA" RDW  Date Value Ref Range Status  08/31/2022 14.4 11.5 - 15.5 % Final

## 2022-10-18 ENCOUNTER — Emergency Department (HOSPITAL_COMMUNITY)
Admission: EM | Admit: 2022-10-18 | Discharge: 2022-10-18 | Disposition: A | Discharge status: Home / Self Care | Payer: Medicare HMO | Attending: Emergency Medicine | Admitting: Emergency Medicine

## 2022-10-18 ENCOUNTER — Encounter (HOSPITAL_COMMUNITY): Payer: Self-pay | Admitting: Emergency Medicine

## 2022-10-18 ENCOUNTER — Emergency Department (HOSPITAL_COMMUNITY): Payer: Medicare HMO

## 2022-10-18 DIAGNOSIS — R1013 Epigastric pain: Secondary | ICD-10-CM | POA: Diagnosis not present

## 2022-10-18 DIAGNOSIS — K219 Gastro-esophageal reflux disease without esophagitis: Secondary | ICD-10-CM | POA: Diagnosis not present

## 2022-10-18 DIAGNOSIS — R079 Chest pain, unspecified: Secondary | ICD-10-CM | POA: Diagnosis not present

## 2022-10-18 LAB — COMPREHENSIVE METABOLIC PANEL
ALT: 24 U/L (ref 0–44)
AST: 19 U/L (ref 15–41)
Albumin: 4.1 g/dL (ref 3.5–5.0)
Alkaline Phosphatase: 66 U/L (ref 38–126)
Anion gap: 10 (ref 5–15)
BUN: 18 mg/dL (ref 8–23)
CO2: 24 mmol/L (ref 22–32)
Calcium: 8.9 mg/dL (ref 8.9–10.3)
Chloride: 104 mmol/L (ref 98–111)
Creatinine, Ser: 1.13 mg/dL (ref 0.61–1.24)
GFR, Estimated: >60 mL/min (ref >60)
Glucose, Bld: 126 mg/dL — ABNORMAL HIGH (ref 70–99)
Potassium: 3.9 mmol/L (ref 3.5–5.1)
Sodium: 138 mmol/L (ref 135–145)
Total Bilirubin: 1 mg/dL (ref 0.3–1.2)
Total Protein: 7.2 g/dL (ref 6.5–8.1)

## 2022-10-18 LAB — CBC WITH DIFFERENTIAL/PLATELET
Abs Immature Granulocytes: 0.01 10*3/uL (ref 0.00–0.07)
Basophils Absolute: 0.1 10*3/uL (ref 0.0–0.1)
Basophils Relative: 1 %
Eosinophils Absolute: 0.2 10*3/uL (ref 0.0–0.5)
Eosinophils Relative: 4 %
HCT: 44.7 % (ref 39.0–52.0)
Hemoglobin: 14.6 g/dL (ref 13.0–17.0)
Immature Granulocytes: 0 %
Lymphocytes Relative: 37 %
Lymphs Abs: 2 10*3/uL (ref 0.7–4.0)
MCH: 30.7 pg (ref 26.0–34.0)
MCHC: 32.7 g/dL (ref 30.0–36.0)
MCV: 93.9 fL (ref 80.0–100.0)
Monocytes Absolute: 0.4 10*3/uL (ref 0.1–1.0)
Monocytes Relative: 7 %
Neutro Abs: 2.7 10*3/uL (ref 1.7–7.7)
Neutrophils Relative %: 51 %
Platelets: 226 10*3/uL (ref 150–400)
RBC: 4.76 MIL/uL (ref 4.22–5.81)
RDW: 14 % (ref 11.5–15.5)
WBC: 5.3 10*3/uL (ref 4.0–10.5)
nRBC: 0 % (ref 0.0–0.2)

## 2022-10-18 LAB — LIPASE, BLOOD: Lipase: 37 U/L (ref 11–51)

## 2022-10-18 MED ORDER — ALUM & MAG HYDROXIDE-SIMETH 200-200-20 MG/5ML PO SUSP
30.0000 mL | Freq: Once | ORAL | Status: AC
  Administered 2022-10-18: 30 mL via ORAL
  Filled 2022-10-18: qty 30

## 2022-10-18 MED ORDER — SUCRALFATE 1 G PO TABS
1.0000 g | ORAL_TABLET | Freq: Once | ORAL | Status: AC
  Administered 2022-10-18: 1 g via ORAL
  Filled 2022-10-18: qty 1

## 2022-10-18 MED ORDER — PANTOPRAZOLE SODIUM 40 MG IV SOLR
40.0000 mg | Freq: Once | INTRAVENOUS | Status: AC
  Administered 2022-10-18: 40 mg via INTRAVENOUS
  Filled 2022-10-18: qty 10

## 2022-10-18 NOTE — ED Provider Notes (Signed)
I saw and evaluated the patient, reviewed the resident's note and I agree with the findings and plan.  EKG Interpretation Date/Time:  Wednesday October 18 2022 08:04:05 EDT Ventricular Rate:  61 PR Interval:  171 QRS Duration:  104 QT Interval:  409 QTC Calculation: 412 R Axis:   -9  Text Interpretation: Sinus rhythm Low voltage, precordial leads No significant change since last tracing Confirmed by Lorre Nick ([id number]) on 10/18/2022 8:19:19 AM   This 77 year old male presents with epigastric pain for quite some time.  History of GERD.  On exam, his abdomen is soft nontender.  EKG rotation shows no signs of acute coronary ischemia.  Suspect GERD.  Will still perform labs and reassess after patient treated with PPI and Carafate   Lorre Nick, MD 10/18/22 [phone]

## 2022-10-18 NOTE — Discharge Instructions (Addendum)
  Por favor programe una cita con el especialista en gastroenterologa lo antes posible para su seguimiento.

## 2022-10-18 NOTE — ED Notes (Addendum)
ED Provider at bedside. 

## 2022-10-18 NOTE — ED Provider Notes (Signed)
EMERGENCY DEPARTMENT AT [facility] Provider Note   CSN: [id number] Arrival date & time: 10/18/22  8469     History  Chief Complaint  Patient presents with   Abdominal Pain    Patrick Marshall is a 77 y.o. male.  Patient is a 77 year old man with history significant for GERD, presenting with abdominal pain. Patient describes burning epigastric pain radiating to his chest that has kept him awake for the past several nights. Pain is worsened after eating and while laying down. Patient says pain is similar to previous episodes of acid reflux. His medications he takes for GERD have been ineffective recently. He has had some nausea without vomiting. The pain has caused occasional shortness of breath. Also notes his stools appear darker. Denies fever, constipation, hematuria, flank pain.    Abdominal Pain Associated symptoms: chest pain, nausea and shortness of breath   Associated symptoms: no constipation, no dysuria, no fever and no vomiting        Home Medications Prior to Admission medications   Medication Sig Start Date End Date Taking? Authorizing Provider  allopurinol (ZYLOPRIM) 100 MG tablet Take 1.5 tablets (150 mg total) by mouth daily. 04/11/21   Donita Brooks, MD  aspirin EC 81 MG tablet Take 81 mg by mouth daily. Swallow whole.    [provider]  atorvastatin (LIPITOR) 40 MG tablet Take 1 tablet (40 mg total) by mouth daily. 10/04/22   Donita Brooks, MD  esomeprazole (NEXIUM) 40 MG capsule Take 1 capsule (40 mg total) by mouth daily. 08/14/22   Donita Brooks, MD  ketorolac (ACULAR) 0.5 % ophthalmic solution INSTILL 1 DROP INTO LEFT EYE TWICE DAILY 10/09/22   Donita Brooks, MD  latanoprost (XALATAN) 0.005 % ophthalmic solution Place 1 drop into the left eye daily. 05/04/20   [provider]  lisinopril (ZESTRIL) 10 MG tablet Take 1 tablet (10 mg total) by mouth daily. 10/18/21   Donita Brooks, MD  metFORMIN (GLUCOPHAGE)  500 MG tablet Take 1 tablet (500 mg total) by mouth 2 (two) times daily. 10/09/22   Donita Brooks, MD  nitroGLYCERIN (NITROSTAT) 0.4 MG SL tablet DISSOLVE ONE TABLET UNDER THE TONGUE EVERY 5 MINUTES AS NEEDED FOR CHEST PAIN.  DO NOT EXCEED A TOTAL OF 3 DOSES IN 15 MINUTES 01/02/22   Donita Brooks, MD  sucralfate (CARAFATE) 1 g tablet Take 1 tablet (1 g total) by mouth 4 (four) times daily -  with meals and at bedtime. 08/14/22   Donita Brooks, MD  tamsulosin (FLOMAX) 0.4 MG CAPS capsule Take 1 capsule (0.4 mg total) by mouth daily. 04/19/22   Donita Brooks, MD      Allergies    Penicillins    Review of Systems   Review of Systems  Constitutional:  Negative for fever.  Respiratory:  Positive for shortness of breath.   Cardiovascular:  Positive for chest pain.  Gastrointestinal:  Positive for abdominal pain and nausea. Negative for blood in stool, constipation and vomiting.  Genitourinary:  Negative for difficulty urinating, dysuria and flank pain.    Physical Exam Updated Vital Signs BP (!) 133/91   Pulse (!) 55   Temp (!) 97.4 F (36.3 C) (Oral)   Resp 17   SpO2 99%  Physical Exam Constitutional:      General: He is not in acute distress. Cardiovascular:     Rate and Rhythm: Normal rate and regular rhythm.  Pulmonary:  Effort: Pulmonary effort is normal. No respiratory distress.     Breath sounds: Normal breath sounds.  Abdominal:     General: There is no distension.     Tenderness: There is no abdominal tenderness. There is no guarding.  Skin:    General: Skin is warm and dry.  Neurological:     General: No focal deficit present.     Mental Status: He is alert and oriented to person, place, and time.     ED Results / Procedures / Treatments   Labs (all labs ordered are listed, but only abnormal results are displayed) Labs Reviewed  COMPREHENSIVE METABOLIC PANEL - Abnormal; Notable for the following components:      Result Value   Glucose, Bld 126 (*)     All other components within normal limits  CBC WITH DIFFERENTIAL/PLATELET  LIPASE, BLOOD    EKG EKG Interpretation Date/Time:  Wednesday October 18 2022 08:04:05 EDT Ventricular Rate:  61 PR Interval:  171 QRS Duration:  104 QT Interval:  409 QTC Calculation: 412 R Axis:   -9  Text Interpretation: Sinus rhythm Low voltage, precordial leads No significant change since last tracing Confirmed by Lorre Nick ([id number]) on 10/18/2022 8:19:19 AM  Radiology DG Chest Portable 1 View  Result Date: 10/18/2022 CLINICAL DATA:  Provided history: Chest pain. EXAM: PORTABLE CHEST 1 VIEW COMPARISON:  Prior chest radiographs 08/31/2022 and earlier. FINDINGS: Heart size within normal limits. No appreciable airspace consolidation or pulmonary edema. No evidence of pleural effusion or pneumothorax. No acute osseous abnormality identified. IMPRESSION: No evidence of active cardiopulmonary disease. Electronically Signed   By: Jackey Loge D.O.   On: 10/18/2022 08:16    Procedures Procedures    Medications Ordered in ED Medications  pantoprazole (PROTONIX) injection 40 mg (40 mg Intravenous Given 10/18/22 0818)  alum & mag hydroxide-simeth (MAALOX/MYLANTA) 200-200-20 MG/5ML suspension 30 mL (30 mLs Oral Given 10/18/22 0818)  sucralfate (CARAFATE) tablet 1 g (1 g Oral Given 10/18/22 4403)    ED Course/ Medical Decision Making/ A&P                             Medical Decision Making Amount and/or Complexity of Data Reviewed Labs: ordered. Radiology: ordered.  Risk OTC drugs. Prescription drug management.  Medical Decision Making:   Patrick Marshall is a 77 y.o. male who presented to the ED today with abdominal pain detailed above.    Patient's presentation is complicated by their history of GERD.  Complete initial physical exam performed, notably the patient  was nontender to abdominal palpation.    Reviewed and confirmed nursing documentation for past medical history, family history, social  history.    Initial Assessment:   With the patient's presentation of abdominal pain, most likely diagnosis is GERD. Other diagnoses were considered including (but not limited to) ACS, pancreatitis, pneumonia, cholecystitis, colitis, UTI. These are considered less likely due to history of present illness and physical exam findings.    Initial Plan:   Screening labs including CBC and Metabolic panel to evaluate for infectious or metabolic etiology of disease.  Urinalysis with reflex culture ordered to evaluate for UTI or relevant urologic/nephrologic pathology.  CXR to evaluate for structural/infectious intrathoracic pathology.  EKG to evaluate for cardiac pathology Objective evaluation as below reviewed   Initial Study Results:   Laboratory  All laboratory results reviewed without evidence of clinically relevant pathology.    EKG EKG was reviewed independently. Rate,  rhythm, axis, intervals all examined and without medically relevant abnormality. ST segments without concerns for elevations.    Radiology:  All images reviewed independently. Agree with radiology report at this time.   DG Chest Portable 1 View  Result Date: 10/18/2022 CLINICAL DATA:  Provided history: Chest pain. EXAM: PORTABLE CHEST 1 VIEW COMPARISON:  Prior chest radiographs 08/31/2022 and earlier. FINDINGS: Heart size within normal limits. No appreciable airspace consolidation or pulmonary edema. No evidence of pleural effusion or pneumothorax. No acute osseous abnormality identified. IMPRESSION: No evidence of active cardiopulmonary disease. Electronically Signed   By: Jackey Loge D.O.   On: 10/18/2022 08:16    Reassessment and Plan:   Patient is a 77 year old man with a history of GERD presenting with burning epigastric pain for the past several nights. Vitals were stable throughout stay in the ED. On exam, patient was nontender to abdominal palpation. Labs showed no significant abnormalities. CXR showed no evidence of  acute cardiopulmonary disease. Differential for abdominal pain is broad, thought I feel this patient most likely is having worsening gastroesophageal reflux. He was given IV protonix and a GI cocktail including Maalox/Mylanta, which was effective in relieving immediate pain. Other life-threatening diagnoses have been ruled out at this time. On reassessment, patient was resting comfortably in bed. At this time I feel comfortable discharging patient with GI follow up. Patient agrees with plan to be discharged.           Final Clinical Impression(s) / ED Diagnoses Final diagnoses:  Epigastric pain  Gastroesophageal reflux disease without esophagitis    Rx / DC Orders ED Discharge Orders     None         Monna Fam, MD 10/18/22 1020    Lorre Nick, MD 10/19/22 [phone]

## 2022-10-18 NOTE — ED Triage Notes (Signed)
Pt endorses upper abd pain for awhile. Last BM this morning. Endorses nausea and states his intestines is making a crunchy noise. Unable to sleep due to pain.

## 2022-10-23 DIAGNOSIS — R42 Dizziness and giddiness: Secondary | ICD-10-CM | POA: Diagnosis not present

## 2022-10-23 DIAGNOSIS — E119 Type 2 diabetes mellitus without complications: Secondary | ICD-10-CM | POA: Diagnosis not present

## 2022-10-23 DIAGNOSIS — Z79899 Other long term (current) drug therapy: Secondary | ICD-10-CM | POA: Diagnosis not present

## 2022-10-23 DIAGNOSIS — I10 Essential (primary) hypertension: Secondary | ICD-10-CM | POA: Diagnosis not present

## 2022-10-23 DIAGNOSIS — K219 Gastro-esophageal reflux disease without esophagitis: Secondary | ICD-10-CM | POA: Diagnosis not present

## 2022-10-23 DIAGNOSIS — R0789 Other chest pain: Secondary | ICD-10-CM | POA: Diagnosis not present

## 2022-10-23 DIAGNOSIS — R110 Nausea: Secondary | ICD-10-CM | POA: Diagnosis not present

## 2022-10-23 DIAGNOSIS — R1013 Epigastric pain: Secondary | ICD-10-CM | POA: Diagnosis not present

## 2022-10-23 DIAGNOSIS — R197 Diarrhea, unspecified: Secondary | ICD-10-CM | POA: Diagnosis not present

## 2022-10-23 DIAGNOSIS — R0602 Shortness of breath: Secondary | ICD-10-CM | POA: Diagnosis not present

## 2022-10-24 ENCOUNTER — Telehealth: Payer: Self-pay

## 2022-10-25 NOTE — Transitions of Care (Post Inpatient/ED Visit) (Signed)
   10/25/2022  Name: Patrick Marshall MRN: [id number] DOB: 07-19-1945  Today's TOC FU Call Status: Today's TOC FU Call Status:: Unsuccessul Call (1st Attempt) Unsuccessful Call (1st Attempt) Date: 10/24/22  Attempted to reach the patient regarding the most recent Inpatient/ED visit.  Used [employer] [id number].  Spoke with patient's daughter as both phone numbers for patient are not correct.  [name], patients daughter noted her Dad was working and she would call this Clinical research associate back when he is available.  Follow Up Plan: Additional outreach attempts will be made to reach the patient to complete the Transitions of Care (Post Inpatient/ED visit) call.   Jodelle Gross, RN, BSN, CCM Care Management Coordinator [place] Phone: [phone]/Fax: [phone]

## 2022-10-27 ENCOUNTER — Telehealth: Payer: Self-pay

## 2022-10-27 NOTE — Transitions of Care (Post Inpatient/ED Visit) (Signed)
10/27/2022  Name: Patrick Marshall MRN: [id number] DOB: July 03, 1945  Today's TOC FU Call Status: Today's TOC FU Call Status:: Successful TOC FU Call Completed TOC FU Call Complete Date: 10/27/22 (Call completed with daughter-Patrick Marshall-DPR on file) [employer] used-Spanish Interpreter-Ariel-ID#[id number]  Red on EMMI-ED Discharge Alert Date & Reason:10/19/22 "Scheduled follow-up appt? No"   Transition Care Management Follow-up Telephone Call Date of Discharge: 10/18/22 Discharge Facility: [facility]) Type of Discharge: Emergency Department Reason for ED Visit: Other: ("epigastric pain") How have you been since you were released from the hospital?: Better Any questions or concerns?: No  Items Reviewed: Did you receive and understand the discharge instructions provided?: Yes Medications obtained,verified, and reconciled?: No Medications Not Reviewed Reasons:: Other: (call completed with daughter-pt at work-she did not know pt's meds-reported no med issues that she was aware of) Any new allergies since your discharge?: No Dietary orders reviewed?: NA Do you have support at home?: Yes People in Home: child(ren), adult Name of Support/Comfort Primary Source: Patrick Marshall  Medications Reviewed Today: Medications Reviewed Today     Reviewed by Charlyn Minerva, RN (Registered Nurse) on 10/27/22 at 1027  Med List Status: <None>   Medication Order Taking? Sig Documenting Provider Last Dose Status Informant  allopurinol (ZYLOPRIM) 100 MG tablet [id number] No Take 1.5 tablets (150 mg total) by mouth daily. Donita Brooks, MD Taking Active   aspirin EC 81 MG tablet [id number] No Take 81 mg by mouth daily. Swallow whole. [provider] Taking Active   atorvastatin (LIPITOR) 40 MG tablet [id number]  Take 1 tablet (40 mg total) by mouth daily. Donita Brooks, MD  Active   esomeprazole (NEXIUM) 40 MG capsule [id number] No Take 1 capsule (40 mg total) by mouth  daily. Donita Brooks, MD Taking Active   ketorolac [facility]) 0.5 % ophthalmic solution [id number]  INSTILL 1 DROP INTO LEFT EYE TWICE DAILY Pickard, Priscille Heidelberg, MD  Active   latanoprost (XALATAN) 0.005 % ophthalmic solution [id number] No Place 1 drop into the left eye daily. [provider] Taking Active   lisinopril (ZESTRIL) 10 MG tablet [id number] No Take 1 tablet (10 mg total) by mouth daily. Donita Brooks, MD Taking Active   metFORMIN (GLUCOPHAGE) 500 MG tablet [id number]  Take 1 tablet (500 mg total) by mouth 2 (two) times daily. Donita Brooks, MD  Active   nitroGLYCERIN (NITROSTAT) 0.4 MG SL tablet [id number] No DISSOLVE ONE TABLET UNDER THE TONGUE EVERY 5 MINUTES AS NEEDED FOR CHEST PAIN.  DO NOT EXCEED A TOTAL OF 3 DOSES IN 15 MINUTES Pickard, Priscille Heidelberg, MD Taking Active   sucralfate (CARAFATE) 1 g tablet [id number] No Take 1 tablet (1 g total) by mouth 4 (four) times daily -  with meals and at bedtime. Donita Brooks, MD Taking Active   tamsulosin [facility]) 0.4 MG CAPS capsule [id number] No Take 1 capsule (0.4 mg total) by mouth daily. Donita Brooks, MD Taking Active             Home Care and Equipment/Supplies: Were Home Health Services Ordered?: NA Any new equipment or medical supplies ordered?: NA  Functional Questionnaire: Do you need assistance with bathing/showering or dressing?: No Do you need assistance with meal preparation?: No Do you need assistance with eating?: No Do you have difficulty maintaining continence: No Do you need assistance with getting out of bed/getting out of a chair/moving?: No Do you have difficulty managing or taking your medications?: No  Follow up appointments reviewed:  PCP Follow-up appointment confirmed?: Yes Date of PCP follow-up appointment?: 11/03/22 Follow-up Provider: Dr. Garey Ham Follow-up appointment confirmed?: Yes Date of Specialist follow-up appointment?: 01/02/23 Follow-Up Specialty Provider::  [name] states she called and made appt-told this was earliest appt available Do you need transportation to your follow-up appointment?: No (daughter confirms no issues with pt getting to MD appts) Do you understand care options if your condition(s) worsen?: Yes-patient verbalized understanding  SDOH Interventions Today    Flowsheet Row Most Recent Value  SDOH Interventions   Transportation Interventions Intervention Not Indicated      TOC Interventions Today    Flowsheet Row Most Recent Value  TOC Interventions   TOC Interventions Discussed/Reviewed TOC Interventions Discussed, Arranged PCP follow up less than 12 days/Care Guide scheduled      Interventions Today    Flowsheet Row Most Recent Value  General Interventions   General Interventions Discussed/Reviewed General Interventions Discussed, Doctor Visits  Doctor Visits Discussed/Reviewed Doctor Visits Discussed, PCP, Specialist  PCP/Specialist Visits Compliance with follow-up visit  Education Interventions   Education Provided Provided Education  Provided Verbal Education On Nutrition, When to see the doctor, Medication  Nutrition Interventions   Nutrition Discussed/Reviewed Nutrition Discussed  Pharmacy Interventions   Pharmacy Dicussed/Reviewed Pharmacy Topics Discussed       Alessandra Grout [facility] Care Management Care Management Community Coordinator Direct Phone: [phone] Toll Free: [phone] Fax: [phone]

## 2022-11-03 ENCOUNTER — Ambulatory Visit (INDEPENDENT_AMBULATORY_CARE_PROVIDER_SITE_OTHER): Payer: Medicare HMO | Admitting: Family Medicine

## 2022-11-03 VITALS — BP 120/72 | HR 89 | Temp 98.0°F | Wt 221.0 lb

## 2022-11-03 DIAGNOSIS — K2950 Unspecified chronic gastritis without bleeding: Secondary | ICD-10-CM | POA: Diagnosis not present

## 2022-11-03 NOTE — Progress Notes (Signed)
Subjective:    Patient ID: Patrick Marshall, male    DOB: 1945/05/21, 77 y.o.   MRN: [id number] 08/14/22 Patient has been dealing with chronic abdominal pain for quite some time.  Please see previous office notes.  He was lost to follow-up as he went back to his native country of [place]  While in [place] he had endoscopy due to his chronic abdominal pain and he was diagnosed with a hiatal hernia per his report.  He was also told that he had an ulcer in the antrum of the stomach and in the duodenal bulb along with chronic gastritis and erosive esophagitis.  He was placed on Nexium twice daily as well as cinitaprida which is available only in [place].  He is here today requesting refills  11/03/22 Patient recently went to the emergency room and [facility].  He states for 3 weeks he has been feeling bad.  He reports pain underneath his xiphoid process.  The pain radiated up into his chest.  It hurts to lay down.  He reported nausea.  He reported a burning pain radiating up his throat.  He has stopped all of his medication.  The only medicine he was taking his aspirin.  Per his report, they did a CT scan in the emergency room that was negative.  They started him on omeprazole 40 mg daily. Past Medical History:  Diagnosis Date   Cataract    Diabetes mellitus without complication (HCC)    x few years.   GERD (gastroesophageal reflux disease)    Glaucoma    Gout    Heart disease    HLD (hyperlipidemia)    Hypertension    Past Surgical History:  Procedure Laterality Date   CHOLECYSTECTOMY     EYE SURGERY     2016 both eyes   UPPER GI ENDOSCOPY  2023   Current Outpatient Medications on File Prior to Visit  Medication Sig Dispense Refill   allopurinol (ZYLOPRIM) 100 MG tablet Take 1.5 tablets (150 mg total) by mouth daily. 180 tablet 3   aspirin EC 81 MG tablet Take 81 mg by mouth daily. Swallow whole.     atorvastatin (LIPITOR) 40 MG tablet Take 1 tablet (40 mg total) by mouth daily. 90  tablet 0   esomeprazole (NEXIUM) 40 MG capsule Take 1 capsule (40 mg total) by mouth daily. 60 capsule 11   ketorolac (ACULAR) 0.5 % ophthalmic solution INSTILL 1 DROP INTO LEFT EYE TWICE DAILY 5 mL 0   latanoprost (XALATAN) 0.005 % ophthalmic solution Place 1 drop into the left eye daily.     lisinopril (ZESTRIL) 10 MG tablet Take 1 tablet (10 mg total) by mouth daily. 90 tablet 3   metFORMIN (GLUCOPHAGE) 500 MG tablet Take 1 tablet (500 mg total) by mouth 2 (two) times daily. 180 tablet 0   nitroGLYCERIN (NITROSTAT) 0.4 MG SL tablet DISSOLVE ONE TABLET UNDER THE TONGUE EVERY 5 MINUTES AS NEEDED FOR CHEST PAIN.  DO NOT EXCEED A TOTAL OF 3 DOSES IN 15 MINUTES 50 tablet 0   sucralfate (CARAFATE) 1 g tablet Take 1 tablet (1 g total) by mouth 4 (four) times daily -  with meals and at bedtime. 90 tablet 11   tamsulosin (FLOMAX) 0.4 MG CAPS capsule Take 1 capsule (0.4 mg total) by mouth daily. 90 capsule 3   No current facility-administered medications on file prior to visit.   Allergies  Allergen Reactions   Penicillins Other (See Comments)  Difficulty sleeping   Social History   Socioeconomic History   Marital status: Married    Spouse name: Not on file   Number of children: 5   Years of education: Not on file   Highest education level: Not on file  Occupational History   Occupation: IT trainer  Tobacco Use   Smoking status: Former    Current packs/day: 0.00    Types: Cigarettes    Start date: 1990    Quit date: 1993    Years since quitting: 31.6   Smokeless tobacco: Never  Vaping Use   Vaping status: Never Used  Substance and Sexual Activity   Alcohol use: Yes    Comment: occasional   Drug use: No   Sexual activity: Not on file  Other Topics Concern   Not on file  Social History Narrative   Not on file   Social Determinants of Health   Financial Resource Strain: Not on file  Food Insecurity: Not on file  Transportation Needs: No Transportation Needs (10/27/2022)    PRAPARE - Administrator, Civil Service (Medical): No    Lack of Transportation (Non-Medical): No  Physical Activity: Not on file  Stress: Not on file  Social Connections: Not on file  Intimate Partner Violence: Not on file      Review of Systems  Gastrointestinal:  Positive for abdominal pain.  All other systems reviewed and are negative.      Objective:   Physical Exam Vitals reviewed.  Constitutional:      General: He is not in acute distress.    Appearance: Normal appearance. He is obese. He is not ill-appearing or toxic-appearing.  HENT:     Right Ear: Hearing, tympanic membrane and ear canal normal. There is no impacted cerumen.     Left Ear: Tympanic membrane and ear canal normal. Decreased hearing noted. There is no impacted cerumen.  Neck:     Vascular: No carotid bruit.  Cardiovascular:     Rate and Rhythm: Normal rate and regular rhythm.     Pulses: Normal pulses.     Heart sounds: Normal heart sounds. No murmur heard.    No friction rub. No gallop.  Pulmonary:     Effort: Pulmonary effort is normal. No respiratory distress.     Breath sounds: Normal breath sounds. No stridor. No wheezing, rhonchi or rales.  Chest:     Chest wall: No tenderness.  Abdominal:     General: Abdomen is flat. Bowel sounds are normal. There is no distension.     Palpations: Abdomen is soft.     Tenderness: There is no abdominal tenderness. There is no guarding.  Musculoskeletal:     Right lower leg: No edema.     Left lower leg: No edema.  Neurological:     Mental Status: He is alert.           Assessment & Plan:  Chronic gastritis without bleeding, unspecified gastritis type I believe the patient has chronic gastritis and his symptoms likely return because he stopped his medication.  Therefore I recommended that he continue omeprazole 40 mg daily for 3 months that he add Pepcid 40 mg daily and use Zofran 4 mg every 8 hours as needed for nausea.  Recheck if no  better in 2 weeks or sooner if worse

## 2022-11-07 ENCOUNTER — Encounter: Payer: Self-pay | Admitting: Family Medicine

## 2022-11-07 ENCOUNTER — Ambulatory Visit (INDEPENDENT_AMBULATORY_CARE_PROVIDER_SITE_OTHER): Payer: Medicare HMO | Admitting: Family Medicine

## 2022-11-07 VITALS — BP 150/100 | HR 74 | Ht 63.0 in | Wt 220.0 lb

## 2022-11-07 DIAGNOSIS — K2950 Unspecified chronic gastritis without bleeding: Secondary | ICD-10-CM

## 2022-11-07 DIAGNOSIS — R079 Chest pain, unspecified: Secondary | ICD-10-CM | POA: Diagnosis not present

## 2022-11-07 MED ORDER — FAMOTIDINE 40 MG PO TABS: 40.0000 mg | ORAL_TABLET | Freq: Every day | ORAL | 3 refills | Status: DC

## 2022-11-07 MED ORDER — OMEPRAZOLE 40 MG PO CPDR: 40.0000 mg | DELAYED_RELEASE_CAPSULE | Freq: Two times a day (BID) | ORAL | 3 refills | Status: DC

## 2022-11-07 NOTE — Progress Notes (Signed)
Subjective:    Patient ID: Patrick Marshall, male    DOB: 1945-04-09, 77 y.o.   MRN: [id number] 08/14/22 Patient has been dealing with chronic abdominal pain for quite some time.  Please see previous office notes.  Last EGD in [place] was 6/23 which showed gastritis. He was lost to follow-up as he went back to his [place]  While in [place] he had endoscopy due to his chronic abdominal pain and he was diagnosed with a hiatal hernia per his report.  He was also told that he had an ulcer in the antrum of the stomach and in the duodenal bulb along with chronic gastritis and erosive esophagitis.  He was placed on Nexium twice daily as well as cinitaprida which is available only in [place].  He is here today requesting refills  11/03/22 Patient recently went to the emergency room and [facility]  He states for 3 weeks he has been feeling bad.  He reports pain underneath his xiphoid process.  The pain radiated up into his chest.  It hurts to lay down.  He reported nausea.  He reported a burning pain radiating up his throat.  He has stopped all of his medication.  The only medicine he was taking his aspirin.  Per his report, they did a CT scan in the emergency room that was negative.  They started him on omeprazole 40 mg daily.  At that time, my plan was: I believe the patient has chronic gastritis and his symptoms likely return because he stopped his medication.  Therefore I recommended that he continue omeprazole 40 mg daily for 3 months that he add Pepcid 40 mg daily and use Zofran 4 mg every 8 hours as needed for nausea.  Recheck if no better in 2 weeks or sooner if worse  11/07/22 Patient stopped taking his omeprazole completely and was only taking famotidine.  He presents today with a burning pain in his epigastric area radiating up to his throat EKG shows normal sinus rhythm with normal intervals with no.  He denies any trouble breathing.  Patient rated his pain at a 10.  I gave him a GI  cocktail including viscous lidocaine and his pain improved to a 3 within 5 minutes.  He denies any angina or shortness of breath.  He states that he has a difficult time eating.  It hurts to even drink water. Past Medical History:  Diagnosis Date   Cataract    Diabetes mellitus without complication (HCC)    x few years.   GERD (gastroesophageal reflux disease)    Glaucoma    Gout    Heart disease    HLD (hyperlipidemia)    Hypertension    Past Surgical History:  Procedure Laterality Date   CHOLECYSTECTOMY     EYE SURGERY     2016 both eyes   UPPER GI ENDOSCOPY  2023   Current Outpatient Medications on File Prior to Visit  Medication Sig Dispense Refill   famotidine (PEPCID) 40 MG tablet Take 40 mg by mouth at bedtime.     omeprazole (PRILOSEC) 20 MG capsule Take by mouth.     ondansetron (ZOFRAN) 4 MG tablet Take 4 mg by mouth every 8 (eight) hours as needed.     allopurinol (ZYLOPRIM) 100 MG tablet Take 1.5 tablets (150 mg total) by mouth daily. (Patient not taking: Reported on 11/03/2022) 180 tablet 3   aspirin EC 81 MG tablet Take 81 mg by mouth daily.  Swallow whole.     atorvastatin (LIPITOR) 40 MG tablet Take 1 tablet (40 mg total) by mouth daily. (Patient not taking: Reported on 11/03/2022) 90 tablet 0   esomeprazole (NEXIUM) 40 MG capsule Take 1 capsule (40 mg total) by mouth daily. (Patient not taking: Reported on 11/03/2022) 60 capsule 11   ketorolac (ACULAR) 0.5 % ophthalmic solution INSTILL 1 DROP INTO LEFT EYE TWICE DAILY (Patient not taking: Reported on 11/03/2022) 5 mL 0   latanoprost (XALATAN) 0.005 % ophthalmic solution Place 1 drop into the left eye daily. (Patient not taking: Reported on 11/03/2022)     lisinopril (ZESTRIL) 10 MG tablet Take 1 tablet (10 mg total) by mouth daily. (Patient not taking: Reported on 11/03/2022) 90 tablet 3   metFORMIN (GLUCOPHAGE) 500 MG tablet Take 1 tablet (500 mg total) by mouth 2 (two) times daily. (Patient not taking: Reported on 11/03/2022) 180  tablet 0   nitroGLYCERIN (NITROSTAT) 0.4 MG SL tablet DISSOLVE ONE TABLET UNDER THE TONGUE EVERY 5 MINUTES AS NEEDED FOR CHEST PAIN.  DO NOT EXCEED A TOTAL OF 3 DOSES IN 15 MINUTES (Patient not taking: Reported on 11/03/2022) 50 tablet 0   sucralfate (CARAFATE) 1 g tablet Take 1 tablet (1 g total) by mouth 4 (four) times daily -  with meals and at bedtime. (Patient not taking: Reported on 11/03/2022) 90 tablet 11   tamsulosin (FLOMAX) 0.4 MG CAPS capsule Take 1 capsule (0.4 mg total) by mouth daily. (Patient not taking: Reported on 11/03/2022) 90 capsule 3   No current facility-administered medications on file prior to visit.   Allergies  Allergen Reactions   Penicillins Other (See Comments)    Difficulty sleeping   Social History   Socioeconomic History   Marital status: Married    Spouse name: Not on file   Number of children: 5   Years of education: Not on file   Highest education level: Not on file  Occupational History   Occupation: IT trainer  Tobacco Use   Smoking status: Former    Current packs/day: 0.00    Types: Cigarettes    Start date: 1990    Quit date: 1993    Years since quitting: 31.6   Smokeless tobacco: Never  Vaping Use   Vaping status: Never Used  Substance and Sexual Activity   Alcohol use: Yes    Comment: occasional   Drug use: No   Sexual activity: Not on file  Other Topics Concern   Not on file  Social History Narrative   Not on file   Social Determinants of Health   Financial Resource Strain: Not on file  Food Insecurity: Not on file  Transportation Needs: No Transportation Needs (10/27/2022)   PRAPARE - Administrator, Civil Service (Medical): No    Lack of Transportation (Non-Medical): No  Physical Activity: Not on file  Stress: Not on file  Social Connections: Not on file  Intimate Partner Violence: Not on file      Review of Systems  Gastrointestinal:  Positive for abdominal pain.  All other systems reviewed and are  negative.      Objective:   Physical Exam Vitals reviewed.  Constitutional:      General: He is not in acute distress.    Appearance: Normal appearance. He is obese. He is not ill-appearing or toxic-appearing.  HENT:     Right Ear: Hearing, tympanic membrane and ear canal normal. There is no impacted cerumen.     Left Ear:  Tympanic membrane and ear canal normal. Decreased hearing noted. There is no impacted cerumen.  Neck:     Vascular: No carotid bruit.  Cardiovascular:     Rate and Rhythm: Normal rate and regular rhythm.     Pulses: Normal pulses.     Heart sounds: Normal heart sounds. No murmur heard.    No friction rub. No gallop.  Pulmonary:     Effort: Pulmonary effort is normal. No respiratory distress.     Breath sounds: Normal breath sounds. No stridor. No wheezing, rhonchi or rales.  Chest:     Chest wall: No tenderness.  Abdominal:     General: Abdomen is flat. Bowel sounds are normal. There is no distension.     Palpations: Abdomen is soft.     Tenderness: There is no abdominal tenderness. There is no guarding.  Musculoskeletal:     Right lower leg: No edema.     Left lower leg: No edema.  Neurological:     Mental Status: He is alert.           Assessment & Plan:   Chest pain, unspecified type - Plan: EKG 12-Lead  Chronic gastritis without bleeding, unspecified gastritis type Spent more than 30 minutes today with the patient explaining that he needs to stay on both medications.  I want him to take omeprazole 40 mg twice daily indefinitely and I want him to take famotidine 40 mg a day.  She completed better.  Patient expresses understanding of the instructions.  An interpreter was used for the entire visit

## 2022-11-14 ENCOUNTER — Encounter: Payer: Self-pay | Admitting: Pharmacist

## 2022-11-14 NOTE — Progress Notes (Signed)
Pharmacy Quality Measure Review  This patient is appearing on a report for being at risk of failing the adherence measure for diabetes medications this calendar year.   Medication: metformin 500 mg BID Last fill date: 11/13/22 for 90 day supply  Insurance report was not up to date. No action needed at this time.   Adam Phenix, PharmD PGY-1 Pharmacy Resident

## 2022-12-08 ENCOUNTER — Ambulatory Visit (HOSPITAL_BASED_OUTPATIENT_CLINIC_OR_DEPARTMENT_OTHER): Payer: Medicare HMO | Admitting: Cardiology

## 2022-12-08 ENCOUNTER — Encounter (HOSPITAL_BASED_OUTPATIENT_CLINIC_OR_DEPARTMENT_OTHER): Payer: Self-pay | Admitting: Cardiology

## 2022-12-08 VITALS — BP 120/100 | HR 76 | Ht 63.0 in | Wt 223.1 lb

## 2022-12-08 DIAGNOSIS — R0789 Other chest pain: Secondary | ICD-10-CM

## 2022-12-08 DIAGNOSIS — R0602 Shortness of breath: Secondary | ICD-10-CM | POA: Diagnosis not present

## 2022-12-08 DIAGNOSIS — I2510 Atherosclerotic heart disease of native coronary artery without angina pectoris: Secondary | ICD-10-CM

## 2022-12-08 DIAGNOSIS — E119 Type 2 diabetes mellitus without complications: Secondary | ICD-10-CM

## 2022-12-08 DIAGNOSIS — I10 Essential (primary) hypertension: Secondary | ICD-10-CM

## 2022-12-08 DIAGNOSIS — E8881 Metabolic syndrome: Secondary | ICD-10-CM | POA: Diagnosis not present

## 2022-12-08 NOTE — Patient Instructions (Signed)
Medication Instructions:  Your physician recommends that you continue on your current medications as directed. Please refer to the Current Medication list given to you today.   *If you need a refill on your cardiac medications before your next appointment, please call your pharmacy*  Lab Work: NONE  Testing/Procedures: Your physician has requested that you have an echocardiogram. Echocardiography is a painless test that uses sound waves to create images of your heart. It provides your doctor with information about the size and shape of your heart and how well your heart's chambers and valves are working. This procedure takes approximately one hour. There are no restrictions for this procedure. Please do NOT wear cologne, perfume, aftershave, or lotions (deodorant is allowed). Please arrive 15 minutes prior to your appointment time.   Follow-Up: At [facility], you and your health needs are our priority.  As part of our continuing mission to provide you with exceptional heart care, we have created designated Provider Care Teams.  These Care Teams include your primary Cardiologist (physician) and Advanced Practice Providers (APPs -  Physician Assistants and Nurse Practitioners) who all work together to provide you with the care you need, when you need it.  We recommend signing up for the patient portal called "MyChart".  Sign up information is provided on this After Visit Summary.  MyChart is used to connect with patients for Virtual Visits (Telemedicine).  Patients are able to view lab/test results, encounter notes, upcoming appointments, etc.  Non-urgent messages can be sent to your provider as well.   To learn more about what you can do with MyChart, go to [email or web address].    Your next appointment:   3 month(s)  The format for your next appointment:   In Person  Provider:   Jodelle Red, MD

## 2022-12-08 NOTE — Progress Notes (Signed)
Cardiology Office Note:  .    Date:  12/08/2022  ID:  Patrick Marshall, DOB 29-May-1945, MRN [id number] PCP: Donita Brooks, MD  [facility] Providers Cardiologist:  Jodelle Red, MD     History of Present Illness: .    Patrick Marshall is a 77 y.o. male with a hx of nonobstructive CAD, hypertension, hyperlipidemia, type II diabetes, GERD, obesity, who is seen for follow-up today. I initially met him 01/13/2020 as a new patient to me at the request of Donita Brooks, MD for the evaluation and management of dyspnea on exertion. He was previously seen by Dr. Antoine Poche in 2019.    CV history: Prior treadmill stress test in 2019 was unremarkable. CT coronary in 2021 with Ca score 325 (51st percentile), 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 Echo 10/2021 with EF 55-60%, normal diastolic function, no significant valve disease. Has severe GERD, very symptomatic, especially when not on meds.  -Comorbidities: hypertension, diabetes, obesity -Exercise level: works daily at a tobacco farm, very active, no limitations. Has worked in the field his entire life.  -Family history:  Father had heart issues, but were not related to his death. Died age 55.    Seen by Jari Favre, PA-C 08/2022 for post hospital follow-up. He had presented to the ED 08/31/2022 with complaints of constant chest discomfort/burning and shortness of breath at rest. EKG showed NSR at 81 bpm, no ST changes. Troponin was normal. His chest pain was thought to be GI in nature. At his visit with [facility] he had ongoing chest tightness and shortness of breath. No relief with Nexium and Carafate. Repeat echocardiogram was ordered but not completed. He returned to the ER 10/18/2022 with epigastric pain. He was given IV protonix and a GI cocktail including Maalox/Mylanta, which was effective in relieving immediate pain. Discharged with GI follow-up. Seen at [facility] ED 10/23/2022 for similar symptoms and cardiac work up was negative. It was felt he  would benefit from further evaluation through endoscopy and H. Pylori testing. Abdominal/Pelvic CTA showed no evidence of mesenteric ischemia. On 11/07/22 his PCP increased omeprazole to 40 mg BID.  Here with Spanish interpreter today. Today, he continues to experience epigastric pain that may be severe at times. He complains of gastric pains after every meal. Additionally he is struggling with feeling "not pleasant" in his chest, doesn't feel good when he breathes and feels like his body gets tired like he doesn't want to do anything (no falls). Mostly he is bothered by the shortness of breath which comes and goes; has also occurred while he is lying down, requiring him to stand or sit up for relief. Has been worse in the early mornings. He believes that the recent medication changes may have helped some. Discussed pursuing echocardiogram.  Infrequently he has noticed that his bilateral toes may become swollen and are painful. He has lost some weight, currently 223 lbs in the office. His blood pressure is 120/100 today.  He denies any palpitations, lightheadedness, headaches, syncope, or PND.  ROS:  Please see the history of present illness. ROS otherwise negative except as noted.  (+) Fatigue/Malaise (+) Shortness of breath (+) Orthopnea (+) Chest discomfort (+) Epiastric pain  Studies Reviewed: .         Echo  10/2021:  1. Left ventricular ejection fraction, by estimation, is 55 to 60%. The  left ventricle has normal function. The left ventricle has no regional  wall motion abnormalities. Left ventricular diastolic parameters were  normal.  2. Right ventricular systolic function is normal. The right ventricular  size is normal. There is normal pulmonary artery systolic pressure.   3. The mitral valve is abnormal. Trivial mitral valve regurgitation. No  evidence of mitral stenosis.   4. The aortic valve is tricuspid. There is mild calcification of the  aortic valve. Aortic valve  regurgitation is not visualized. Aortic valve  sclerosis is present, with no evidence of aortic valve stenosis.   5. Aortic dilatation noted. There is mild dilatation of the ascending  aorta, measuring 39 mm.   6. The inferior vena cava is normal in size with greater than 50%  respiratory variability, suggesting right atrial pressure of 3 mmHg.     Physical Exam:    VS:  BP (!) 120/100 Comment: BP left arm 112/90  Pulse 76   Ht 5\' 3"  (1.6 m)   Wt 223 lb 1.6 oz (101.2 kg)   SpO2 100%   BMI 39.52 kg/m    Wt Readings from Last 3 Encounters:  12/08/22 223 lb 1.6 oz (101.2 kg)  11/07/22 220 lb (99.8 kg)  11/03/22 221 lb (100.2 kg)    GEN: Well nourished, well developed in no acute distress HEENT: Normal, moist mucous membranes NECK: No JVD CARDIAC: regular rhythm, normal S1 and S2, no rubs or gallops. No murmur. VASCULAR: Radial and DP pulses 2+ bilaterally. No carotid bruits RESPIRATORY:  Clear to auscultation without rales, wheezing or rhonchi  ABDOMEN: Soft, non-tender, non-distended MUSCULOSKELETAL:  Ambulates independently SKIN: Warm and dry, no edema NEUROLOGIC:  Alert and oriented x 3. No focal neuro deficits noted. PSYCHIATRIC:  Normal affect   ASSESSMENT AND PLAN: .    Atypical chest/abdominal pain, most consistent with GERD -reassuring evaluation in ER, symptoms improved with GERD treatment -reviewed red flag warning signs that need immediate medical attention  Nonobstructive coronary disease Metabolic syndrome: type II diabetes, hypertension, hyperlipidemia, and obesity.  -Last A1c 7.1, on metformin for diabetes -BP goal <130/80, diastolic elevated today, continue to monitor. Continue lisinopril -Lipids per [facility] in 09/2021 show Tchol 113, HDL 55, LDL 38, TG 115 -BMI 39, would benefit from weight loss. With CAD, diabetes, and obesity, would consider GLP1RA  Shortness of breath -largely at rest and while laying down, not as much exertional -previously recommended to  repeat echo, agree with this -if no significant changes, suggests noncardiac etiology -with risk factors, body habitus, would consider evaluation for sleep apnea.   Cardiac risk counseling and prevention recommendations: -recommend heart healthy/Mediterranean diet, with whole grains, fruits, vegetable, fish, lean meats, nuts, and olive oil. Limit salt. -recommend moderate walking, 3-5 times/week for 30-50 minutes each session. Aim for at least 150 minutes.week. Goal should be pace of 3 miles/hours, or walking 1.5 miles in 30 minutes -recommend avoidance of tobacco products. Avoid excess alcohol.:   Dispo: Follow-up in 3 months, or sooner as needed.  I,Mathew Stumpf,acting as a Neurosurgeon for [employer], MD.,have documented all relevant documentation on the behalf of Jodelle Red, MD,as directed by  Jodelle Red, MD while in the presence of Jodelle Red, MD.  I, Jodelle Red, MD, have reviewed all documentation for this visit. The documentation on 12/08/22 for the exam, diagnosis, procedures, and orders are all accurate and complete.   Signed, Jodelle Red, MD

## 2022-12-18 ENCOUNTER — Ambulatory Visit: Payer: Medicare HMO | Admitting: Family Medicine

## 2022-12-26 ENCOUNTER — Ambulatory Visit (HOSPITAL_BASED_OUTPATIENT_CLINIC_OR_DEPARTMENT_OTHER): Payer: Medicare HMO

## 2022-12-26 DIAGNOSIS — R0602 Shortness of breath: Secondary | ICD-10-CM | POA: Diagnosis not present

## 2022-12-26 LAB — ECHOCARDIOGRAM COMPLETE
AR max vel: 2.45 cm2
AV Area VTI: 2.67 cm2
AV Area mean vel: 2.47 cm2
AV Mean grad: 3 mm[Hg]
AV Peak grad: 6.7 mm[Hg]
Ao pk vel: 1.29 m/s
Area-P 1/2: 3.26 cm2
S' Lateral: 3.16 cm

## 2023-01-02 ENCOUNTER — Ambulatory Visit: Payer: Medicare HMO | Admitting: Nurse Practitioner

## 2023-01-14 ENCOUNTER — Other Ambulatory Visit: Payer: Self-pay | Admitting: Family Medicine

## 2023-01-15 ENCOUNTER — Other Ambulatory Visit: Payer: Self-pay | Admitting: Family Medicine

## 2023-01-15 DIAGNOSIS — E785 Hyperlipidemia, unspecified: Secondary | ICD-10-CM

## 2023-01-16 NOTE — Telephone Encounter (Signed)
Last OV 10/09/22 within protocol.  Requested Prescriptions  Pending Prescriptions Disp Refills   ketorolac (ACULAR) 0.5 % ophthalmic solution [Pharmacy Med Name: Ketorolac Tromethamine 0.5 % Ophthalmic Solution] 5 mL 0    Sig: INSTILL 1 DROP INTO LEFT EYE TWICE DAILY     Analgesics: NSAIDS 2 Failed - 01/14/2023  2:02 PM      Failed - Valid encounter within last 12 months    Recent Outpatient Visits           1 year ago Dyspepsia   [facility] Donita Brooks, MD   1 year ago Elevated alkaline phosphatase level   [facility] Pickard, Priscille Heidelberg, MD   1 year ago Controlled type 2 diabetes mellitus without complication, without long-term current use of insulin (HCC)   [facility] Medicine Pickard, Priscille Heidelberg, MD   1 year ago Gastroesophageal reflux disease without esophagitis   [facility] Medicine Cathlean Marseilles A, NP   2 years ago Type 2 diabetes mellitus without complication, without long-term current use of insulin (HCC)   [facility] Medicine Cathlean Marseilles A, NP              Passed - HGB in normal range and within 360 days    Hemoglobin  Date Value Ref Range Status  10/18/2022 14.6 13.0 - 17.0 g/dL Final         Passed - Cr in normal range and within 360 days    Creat  Date Value Ref Range Status  03/23/2022 1.24 0.70 - 1.28 mg/dL Final   Creatinine, Ser  Date Value Ref Range Status  10/18/2022 1.13 0.61 - 1.24 mg/dL Final         Passed - Patient is not pregnant

## 2023-01-16 NOTE — Telephone Encounter (Signed)
Pharmacy sent script to follow up on refill requested for atorvastatin (LIPITOR) 40 MG tablet [[id number]]  DISCONTINUED   **90 day script requested**  Pharmacy:  [facility] [address] ([place]), [place] - [address] [place] ([place]) [place] [address] Phone: [phone]  Fax: [phone]   Please advise pharmacist.

## 2023-01-17 NOTE — Telephone Encounter (Signed)
D/C 12/08/22. Requested Prescriptions  Refused Prescriptions Disp Refills   atorvastatin (LIPITOR) 40 MG tablet [Pharmacy Med Name: Atorvastatin Calcium 40 MG Oral Tablet] 90 tablet 0    Sig: Take 1 tablet by mouth once daily     Cardiovascular:  Antilipid - Statins Failed - 01/16/2023  9:27 AM      Failed - Valid encounter within last 12 months    Recent Outpatient Visits           1 year ago Dyspepsia   [facility] Tanya Nones, Priscille Heidelberg, MD   1 year ago Elevated alkaline phosphatase level   [facility] Pickard, Priscille Heidelberg, MD   1 year ago Controlled type 2 diabetes mellitus without complication, without long-term current use of insulin (HCC)   [facility] Medicine Pickard, Priscille Heidelberg, MD   1 year ago Gastroesophageal reflux disease without esophagitis   [facility] Medicine Cathlean Marseilles A, NP   2 years ago Type 2 diabetes mellitus without complication, without long-term current use of insulin (HCC)   [facility] Medicine Cathlean Marseilles A, NP              Failed - Lipid Panel in normal range within the last 12 months    Cholesterol  Date Value Ref Range Status  10/18/2021 113 <200 mg/dL Final   LDL Cholesterol (Calc)  Date Value Ref Range Status  10/18/2021 38 mg/dL (calc) Final    Comment:    Reference range: <100 . Desirable range <100 mg/dL for primary prevention;   <70 mg/dL for patients with CHD or diabetic patients  with > or = 2 CHD risk factors. Marland Kitchen LDL-C is now calculated using the Martin-Hopkins  calculation, which is a validated novel method providing  better accuracy than the Friedewald equation in the  estimation of LDL-C.  Horald Pollen et al. Lenox Ahr. 2841;324(40): 2061-2068  (http://education.[employer].com/faq/FAQ164)    HDL  Date Value Ref Range Status  10/18/2021 55 > OR = 40 mg/dL Final   Triglycerides  Date Value Ref Range Status  10/18/2021 115 <150 mg/dL Final         Passed -  Patient is not pregnant

## 2023-02-24 ENCOUNTER — Other Ambulatory Visit: Payer: Self-pay | Admitting: Family Medicine

## 2023-02-24 DIAGNOSIS — I10 Essential (primary) hypertension: Secondary | ICD-10-CM

## 2023-02-24 DIAGNOSIS — E119 Type 2 diabetes mellitus without complications: Secondary | ICD-10-CM

## 2023-02-27 ENCOUNTER — Other Ambulatory Visit: Payer: Self-pay

## 2023-02-27 DIAGNOSIS — E119 Type 2 diabetes mellitus without complications: Secondary | ICD-10-CM

## 2023-02-27 MED ORDER — LISINOPRIL 10 MG PO TABS
10.0000 mg | ORAL_TABLET | Freq: Every day | ORAL | 0 refills | Status: DC
Start: 2023-02-27 — End: 2023-08-25

## 2023-02-27 MED ORDER — METFORMIN HCL 500 MG PO TABS
500.0000 mg | ORAL_TABLET | Freq: Two times a day (BID) | ORAL | 0 refills | Status: DC
Start: 2023-02-27 — End: 2023-08-25

## 2023-02-27 NOTE — Telephone Encounter (Signed)
Requested Prescriptions  Pending Prescriptions Disp Refills   lisinopril (ZESTRIL) 10 MG tablet 30 tablet 0    Sig: Take 1 tablet (10 mg total) by mouth daily.     Cardiovascular:  ACE Inhibitors Failed - 02/27/2023  4:34 PM      Failed - Last BP in normal range    BP Readings from Last 1 Encounters:  12/08/22 (!) 120/100         Failed - Valid encounter within last 6 months    Recent Outpatient Visits           1 year ago Dyspepsia   [facility] Donita Brooks, MD   1 year ago Elevated alkaline phosphatase level   [facility] Tanya Nones, Priscille Heidelberg, MD   1 year ago Controlled type 2 diabetes mellitus without complication, without long-term current use of insulin (HCC)   [facility] Medicine Donita Brooks, MD   2 years ago Gastroesophageal reflux disease without esophagitis   [facility] Medicine Valentino Nose, NP   2 years ago Type 2 diabetes mellitus without complication, without long-term current use of insulin (HCC)   [facility] Medicine Valentino Nose, NP              Passed - Cr in normal range and within 180 days    Creat  Date Value Ref Range Status  03/23/2022 1.24 0.70 - 1.28 mg/dL Final   Creatinine, Ser  Date Value Ref Range Status  10/18/2022 1.13 0.61 - 1.24 mg/dL Final         Passed - K in normal range and within 180 days    Potassium  Date Value Ref Range Status  10/18/2022 3.9 3.5 - 5.1 mmol/L Final         Passed - Patient is not pregnant      Refused Prescriptions Disp Refills   metFORMIN (GLUCOPHAGE) 500 MG tablet [Pharmacy Med Name: metFORMIN HCl 500 MG Oral Tablet] 180 tablet 0    Sig: Take 1 tablet by mouth twice daily     Endocrinology:  Diabetes - Biguanides Failed - 02/27/2023  4:34 PM      Failed - HBA1C is between 0 and 7.9 and within 180 days    Hgb A1c MFr Bld  Date Value Ref Range Status  03/23/2022 7.1 (H) <5.7 % of total Hgb Final    Comment:    For  someone without known diabetes, a hemoglobin A1c value of 6.5% or greater indicates that they may have  diabetes and this should be confirmed with a follow-up  test. . For someone with known diabetes, a value <7% indicates  that their diabetes is well controlled and a value  greater than or equal to 7% indicates suboptimal  control. A1c targets should be individualized based on  duration of diabetes, age, comorbid conditions, and  other considerations. . Currently, no consensus exists regarding use of hemoglobin A1c for diagnosis of diabetes for children. .          Failed - B12 Level in normal range and within 720 days    No results found for: "VITAMINB12"       Failed - Valid encounter within last 6 months    Recent Outpatient Visits           1 year ago Dyspepsia   [facility] Donita Brooks, MD   1 year ago Elevated alkaline phosphatase level  [facility] Pickard, Priscille Heidelberg, MD   1 year ago Controlled type 2 diabetes mellitus without complication, without long-term current use of insulin (HCC)   [facility] Medicine Tanya Nones, Priscille Heidelberg, MD   2 years ago Gastroesophageal reflux disease without esophagitis   [facility] Medicine Valentino Nose, NP   2 years ago Type 2 diabetes mellitus without complication, without long-term current use of insulin (HCC)   [facility] Medicine Valentino Nose, NP              Passed - Cr in normal range and within 360 days    Creat  Date Value Ref Range Status  03/23/2022 1.24 0.70 - 1.28 mg/dL Final   Creatinine, Ser  Date Value Ref Range Status  10/18/2022 1.13 0.61 - 1.24 mg/dL Final         Passed - eGFR in normal range and within 360 days    GFR, Est African American  Date Value Ref Range Status  08/27/2020 71 > OR = 60 mL/min/1.58m2 Final   GFR, Est Non African American  Date Value Ref Range Status  08/27/2020 61 > OR = 60 mL/min/1.87m2 Final   GFR,  Estimated  Date Value Ref Range Status  10/18/2022 >60 >60 mL/min Final    Comment:    (NOTE) Calculated using the CKD-EPI Creatinine Equation (2021)    eGFR  Date Value Ref Range Status  03/23/2022 61 > OR = 60 mL/min/1.78m2 Final         Passed - CBC within normal limits and completed in the last 12 months    WBC  Date Value Ref Range Status  10/18/2022 5.3 4.0 - 10.5 K/uL Final   RBC  Date Value Ref Range Status  10/18/2022 4.76 4.22 - 5.81 MIL/uL Final   Hemoglobin  Date Value Ref Range Status  10/18/2022 14.6 13.0 - 17.0 g/dL Final   HCT  Date Value Ref Range Status  10/18/2022 44.7 39.0 - 52.0 % Final   MCHC  Date Value Ref Range Status  10/18/2022 32.7 30.0 - 36.0 g/dL Final   [facility]  Date Value Ref Range Status  10/18/2022 30.7 26.0 - 34.0 pg Final   MCV  Date Value Ref Range Status  10/18/2022 93.9 80.0 - 100.0 fL Final   No results found for: "PLTCOUNTKUC", "LABPLAT", "POCPLA" RDW  Date Value Ref Range Status  10/18/2022 14.0 11.5 - 15.5 % Final

## 2023-02-27 NOTE — Telephone Encounter (Signed)
Duplicate request- filled by office today Requested Prescriptions  Pending Prescriptions Disp Refills   metFORMIN (GLUCOPHAGE) 500 MG tablet [Pharmacy Med Name: metFORMIN HCl 500 MG Oral Tablet] 180 tablet 0    Sig: Take 1 tablet by mouth twice daily     Endocrinology:  Diabetes - Biguanides Failed - 02/27/2023  4:06 PM      Failed - HBA1C is between 0 and 7.9 and within 180 days    Hgb A1c MFr Bld  Date Value Ref Range Status  03/23/2022 7.1 (H) <5.7 % of total Hgb Final    Comment:    For someone without known diabetes, a hemoglobin A1c value of 6.5% or greater indicates that they may have  diabetes and this should be confirmed with a follow-up  test. . For someone with known diabetes, a value <7% indicates  that their diabetes is well controlled and a value  greater than or equal to 7% indicates suboptimal  control. A1c targets should be individualized based on  duration of diabetes, age, comorbid conditions, and  other considerations. . Currently, no consensus exists regarding use of hemoglobin A1c for diagnosis of diabetes for children. .          Failed - B12 Level in normal range and within 720 days    No results found for: "VITAMINB12"       Failed - Valid encounter within last 6 months    Recent Outpatient Visits           1 year ago Dyspepsia   [facility] Tanya Nones, Priscille Heidelberg, MD   1 year ago Elevated alkaline phosphatase level   [facility] Tanya Nones, Priscille Heidelberg, MD   1 year ago Controlled type 2 diabetes mellitus without complication, without long-term current use of insulin (HCC)   [facility] Medicine Donita Brooks, MD   2 years ago Gastroesophageal reflux disease without esophagitis   [facility] Medicine Valentino Nose, NP   2 years ago Type 2 diabetes mellitus without complication, without long-term current use of insulin (HCC)   [facility] Medicine Valentino Nose, NP               Passed - Cr in normal range and within 360 days    Creat  Date Value Ref Range Status  03/23/2022 1.24 0.70 - 1.28 mg/dL Final   Creatinine, Ser  Date Value Ref Range Status  10/18/2022 1.13 0.61 - 1.24 mg/dL Final         Passed - eGFR in normal range and within 360 days    GFR, Est African American  Date Value Ref Range Status  08/27/2020 71 > OR = 60 mL/min/1.60m2 Final   GFR, Est Non African American  Date Value Ref Range Status  08/27/2020 61 > OR = 60 mL/min/1.24m2 Final   GFR, Estimated  Date Value Ref Range Status  10/18/2022 >60 >60 mL/min Final    Comment:    (NOTE) Calculated using the CKD-EPI Creatinine Equation (2021)    eGFR  Date Value Ref Range Status  03/23/2022 61 > OR = 60 mL/min/1.37m2 Final         Passed - CBC within normal limits and completed in the last 12 months    WBC  Date Value Ref Range Status  10/18/2022 5.3 4.0 - 10.5 K/uL Final   RBC  Date Value Ref Range Status  10/18/2022 4.76 4.22 - 5.81 MIL/uL Final   Hemoglobin  Date Value Ref Range Status  10/18/2022 14.6 13.0 - 17.0 g/dL Final   HCT  Date Value Ref Range Status  10/18/2022 44.7 39.0 - 52.0 % Final   MCHC  Date Value Ref Range Status  10/18/2022 32.7 30.0 - 36.0 g/dL Final   [facility]  Date Value Ref Range Status  10/18/2022 30.7 26.0 - 34.0 pg Final   MCV  Date Value Ref Range Status  10/18/2022 93.9 80.0 - 100.0 fL Final   No results found for: "PLTCOUNTKUC", "LABPLAT", "POCPLA" RDW  Date Value Ref Range Status  10/18/2022 14.0 11.5 - 15.5 % Final

## 2023-02-27 NOTE — Telephone Encounter (Signed)
Pharmacy requesting refills of the following meds:  metFORMIN (GLUCOPHAGE) 500 MG tablet [[id number]]   lisinopril (ZESTRIL) 10 MG tablet   Pharmacy:  [facility] [address] ([place]), [place] - [address] [place] ([place]) [place] [address] Phone: [phone]  Fax: [phone]   Please advise pharmacist.

## 2023-02-27 NOTE — Addendum Note (Signed)
Addended by: Elby Beck F on: 02/27/2023 04:34 PM   Modules accepted: Orders

## 2023-03-15 ENCOUNTER — Other Ambulatory Visit: Payer: Self-pay

## 2023-03-15 DIAGNOSIS — E119 Type 2 diabetes mellitus without complications: Secondary | ICD-10-CM

## 2023-07-16 ENCOUNTER — Ambulatory Visit (INDEPENDENT_AMBULATORY_CARE_PROVIDER_SITE_OTHER): Admitting: Family Medicine

## 2023-07-16 ENCOUNTER — Encounter: Payer: Self-pay | Admitting: Family Medicine

## 2023-07-16 VITALS — BP 140/82 | HR 73 | Temp 98.1°F | Ht 63.0 in | Wt 224.6 lb

## 2023-07-16 DIAGNOSIS — M25511 Pain in right shoulder: Secondary | ICD-10-CM

## 2023-07-16 MED ORDER — TRIAMCINOLONE ACETONIDE 40 MG/ML IJ SUSP
40.0000 mg | Freq: Once | INTRAMUSCULAR | Status: AC
Start: 2023-07-16 — End: 2023-07-16
  Administered 2023-07-16: 40 mg via INTRAMUSCULAR

## 2023-07-16 NOTE — Progress Notes (Signed)
 Subjective:    Patient ID: Patrick Marshall, male    DOB: Nov 28, 1945, 78 y.o.   MRN: [id number] Patient reports pain in his right shoulder for 6 to 8 months.  He states that it started when he fell off a tractor.  He reports pain directly in the right shoulder.  It hurts to raise his arm greater than 90 degrees.  It also aches and throbs at night and keeps him awake.  The patient has full passive range of motion in his shoulder however he does report pain greater than 90 degrees.  Patient has weakness with resisted abduction.  However effort was diminished.  I believe the effort was due to pain.  Patient was also having a difficult time understanding direction due to language barrier.  He reports pain with Hawking's maneuver.  He also reports pain with empty can testing.  He denies any numbness or tingling radiating down his arm.  However he does report pain radiating from his shoulder to his mid tricep. Past Medical History:  Diagnosis Date   Cataract    Diabetes mellitus without complication (HCC)    x few years.   GERD (gastroesophageal reflux disease)    Glaucoma    Gout    Heart disease    HLD (hyperlipidemia)    Hypertension    Past Surgical History:  Procedure Laterality Date   CHOLECYSTECTOMY     EYE SURGERY     2016 both eyes   UPPER GI ENDOSCOPY  2023   Current Outpatient Medications on File Prior to Visit  Medication Sig Dispense Refill   allopurinol  (ZYLOPRIM ) 100 MG tablet Take 1.5 tablets (150 mg total) by mouth daily. 180 tablet 3   aspirin  EC 81 MG tablet Take 81 mg by mouth daily. Swallow whole.     esomeprazole  (NEXIUM ) 40 MG capsule Take 1 capsule (40 mg total) by mouth daily. 60 capsule 11   famotidine  (PEPCID ) 40 MG tablet Take 1 tablet (40 mg total) by mouth at bedtime. 90 tablet 3   ketorolac  (ACULAR ) 0.5 % ophthalmic solution INSTILL 1 DROP INTO LEFT EYE TWICE DAILY 5 mL 0   latanoprost (XALATAN) 0.005 % ophthalmic solution Place 1 drop into the left eye  daily.     lisinopril  (ZESTRIL ) 10 MG tablet Take 1 tablet (10 mg total) by mouth daily. 30 tablet 0   metFORMIN  (GLUCOPHAGE ) 500 MG tablet Take 1 tablet (500 mg total) by mouth 2 (two) times daily. 60 tablet 0   nitroGLYCERIN  (NITROSTAT ) 0.4 MG SL tablet DISSOLVE ONE TABLET UNDER THE TONGUE EVERY 5 MINUTES AS NEEDED FOR CHEST PAIN.  DO NOT EXCEED A TOTAL OF 3 DOSES IN 15 MINUTES 50 tablet 0   omeprazole  (PRILOSEC) 40 MG capsule Take 1 capsule (40 mg total) by mouth 2 (two) times daily. 180 capsule 3   tamsulosin  (FLOMAX ) 0.4 MG CAPS capsule Take 1 capsule (0.4 mg total) by mouth daily. 90 capsule 3   omeprazole  (PRILOSEC) 20 MG capsule Take by mouth.     No current facility-administered medications on file prior to visit.   Allergies  Allergen Reactions   Penicillins Other (See Comments)    Difficulty sleeping   Social History   Socioeconomic History   Marital status: Married    Spouse name: Not on file   Number of children: 5   Years of education: Not on file   Highest education level: Not on file  Occupational History   Occupation: IT trainer  Tobacco  Use   Smoking status: Former    Current packs/day: 0.00    Types: Cigarettes    Start date: 24    Quit date: 1993    Years since quitting: 32.3   Smokeless tobacco: Never  Vaping Use   Vaping status: Never Used  Substance and Sexual Activity   Alcohol use: Yes    Comment: occasional   Drug use: No   Sexual activity: Not on file  Other Topics Concern   Not on file  Social History Narrative   Not on file   Social Drivers of Health   Financial Resource Strain: Not on file  Food Insecurity: Not on file  Transportation Needs: No Transportation Needs (10/27/2022)   PRAPARE - Administrator, Civil Service (Medical): No    Lack of Transportation (Non-Medical): No  Physical Activity: Not on file  Stress: Not on file  Social Connections: Not on file  Intimate Partner Violence: Not on file      Review of  Systems  Gastrointestinal:  Positive for abdominal pain.  All other systems reviewed and are negative.      Objective:   Physical Exam Vitals reviewed.  Constitutional:      General: He is not in acute distress.    Appearance: Normal appearance. He is obese. He is not ill-appearing or toxic-appearing.  HENT:     Right Ear: Hearing normal.     Left Ear: Decreased hearing noted.  Neck:     Vascular: No carotid bruit.  Cardiovascular:     Rate and Rhythm: Normal rate and regular rhythm.     Pulses: Normal pulses.     Heart sounds: Normal heart sounds. No murmur heard.    No friction rub. No gallop.  Pulmonary:     Effort: Pulmonary effort is normal. No respiratory distress.     Breath sounds: Normal breath sounds. No stridor. No wheezing, rhonchi or rales.  Chest:     Chest wall: No tenderness.  Musculoskeletal:     Right shoulder: Tenderness and bony tenderness present. No crepitus. Decreased range of motion. Decreased strength.  Neurological:     Mental Status: He is alert.           Assessment & Plan:   Acute pain of right shoulder - Plan: triamcinolone  acetonide (KENALOG -40) injection 40 mg [place] and was told that he had a tear in the tendon.  I do believe the patient is dealing with impingement syndrome and I cannot rule out a tear in his rotator cuff.  I believe cervical radiculopathy is less likely.  Using sterile technique, I injected the right subacromial space with 2 cc of lidocaine, 2 cc of Marcaine, and 2 cc of 40 mg/mL Kenalog .  The patient tolerated the procedure well without complication

## 2023-08-07 DIAGNOSIS — H524 Presbyopia: Secondary | ICD-10-CM | POA: Diagnosis not present

## 2023-08-09 ENCOUNTER — Emergency Department (HOSPITAL_COMMUNITY)
Admission: EM | Admit: 2023-08-09 | Discharge: 2023-08-09 | Disposition: A | Discharge status: Home / Self Care | Attending: Emergency Medicine | Admitting: Emergency Medicine

## 2023-08-09 ENCOUNTER — Encounter (HOSPITAL_COMMUNITY): Payer: Self-pay

## 2023-08-09 ENCOUNTER — Emergency Department (HOSPITAL_COMMUNITY)

## 2023-08-09 ENCOUNTER — Other Ambulatory Visit: Payer: Self-pay

## 2023-08-09 DIAGNOSIS — R1013 Epigastric pain: Secondary | ICD-10-CM | POA: Insufficient documentation

## 2023-08-09 DIAGNOSIS — I10 Essential (primary) hypertension: Secondary | ICD-10-CM | POA: Diagnosis not present

## 2023-08-09 DIAGNOSIS — Z79899 Other long term (current) drug therapy: Secondary | ICD-10-CM | POA: Diagnosis not present

## 2023-08-09 DIAGNOSIS — E119 Type 2 diabetes mellitus without complications: Secondary | ICD-10-CM | POA: Insufficient documentation

## 2023-08-09 DIAGNOSIS — Z7984 Long term (current) use of oral hypoglycemic drugs: Secondary | ICD-10-CM | POA: Insufficient documentation

## 2023-08-09 DIAGNOSIS — R0989 Other specified symptoms and signs involving the circulatory and respiratory systems: Secondary | ICD-10-CM | POA: Diagnosis not present

## 2023-08-09 DIAGNOSIS — R9431 Abnormal electrocardiogram [ECG] [EKG]: Secondary | ICD-10-CM | POA: Diagnosis not present

## 2023-08-09 DIAGNOSIS — Z7982 Long term (current) use of aspirin: Secondary | ICD-10-CM | POA: Insufficient documentation

## 2023-08-09 DIAGNOSIS — R0689 Other abnormalities of breathing: Secondary | ICD-10-CM | POA: Diagnosis not present

## 2023-08-09 DIAGNOSIS — N400 Enlarged prostate without lower urinary tract symptoms: Secondary | ICD-10-CM | POA: Diagnosis not present

## 2023-08-09 DIAGNOSIS — R079 Chest pain, unspecified: Secondary | ICD-10-CM | POA: Diagnosis not present

## 2023-08-09 LAB — CBC
HCT: 50.6 % (ref 39.0–52.0)
Hemoglobin: 17.2 g/dL — ABNORMAL HIGH (ref 13.0–17.0)
MCH: 32.3 pg (ref 26.0–34.0)
MCHC: 34 g/dL (ref 30.0–36.0)
MCV: 94.9 fL (ref 80.0–100.0)
Platelets: 245 10*3/uL (ref 150–400)
RBC: 5.33 MIL/uL (ref 4.22–5.81)
RDW: 13.3 % (ref 11.5–15.5)
WBC: 7.9 10*3/uL (ref 4.0–10.5)
nRBC: 0 % (ref 0.0–0.2)

## 2023-08-09 LAB — HEPATIC FUNCTION PANEL
ALT: 23 U/L (ref 0–44)
AST: 21 U/L (ref 15–41)
Albumin: 4.4 g/dL (ref 3.5–5.0)
Alkaline Phosphatase: 80 U/L (ref 38–126)
Bilirubin, Direct: 0.3 mg/dL — ABNORMAL HIGH (ref 0.0–0.2)
Indirect Bilirubin: 1 mg/dL — ABNORMAL HIGH (ref 0.3–0.9)
Total Bilirubin: 1.3 mg/dL — ABNORMAL HIGH (ref 0.0–1.2)
Total Protein: 6.9 g/dL (ref 6.5–8.1)

## 2023-08-09 LAB — BASIC METABOLIC PANEL WITH GFR
Anion gap: 9 (ref 5–15)
BUN: 17 mg/dL (ref 8–23)
CO2: 25 mmol/L (ref 22–32)
Calcium: 9.2 mg/dL (ref 8.9–10.3)
Chloride: 102 mmol/L (ref 98–111)
Creatinine, Ser: 1.21 mg/dL (ref 0.61–1.24)
GFR, Estimated: >60 mL/min (ref >60)
Glucose, Bld: 173 mg/dL — ABNORMAL HIGH (ref 70–99)
Potassium: 3.6 mmol/L (ref 3.5–5.1)
Sodium: 136 mmol/L (ref 135–145)

## 2023-08-09 LAB — TROPONIN I (HIGH SENSITIVITY)
Troponin I (High Sensitivity): 4 ng/L (ref <18)
Troponin I (High Sensitivity): 4 ng/L (ref <18)

## 2023-08-09 LAB — LIPASE, BLOOD: Lipase: 34 U/L (ref 11–51)

## 2023-08-09 MED ORDER — ONDANSETRON HCL 4 MG/2ML IJ SOLN
4.0000 mg | Freq: Once | INTRAMUSCULAR | Status: AC
  Administered 2023-08-09: 4 mg via INTRAVENOUS
  Filled 2023-08-09: qty 2

## 2023-08-09 MED ORDER — PANTOPRAZOLE SODIUM 40 MG IV SOLR
40.0000 mg | Freq: Once | INTRAVENOUS | Status: AC
  Administered 2023-08-09: 40 mg via INTRAVENOUS
  Filled 2023-08-09: qty 10

## 2023-08-09 MED ORDER — SODIUM CHLORIDE (PF) 0.9 % IJ SOLN
INTRAMUSCULAR | Status: AC
  Filled 2023-08-09: qty 50

## 2023-08-09 MED ORDER — SUCRALFATE 1 GM/10ML PO SUSP
1.0000 g | Freq: Once | ORAL | Status: AC
  Administered 2023-08-09: 1 g via ORAL
  Filled 2023-08-09: qty 10

## 2023-08-09 MED ORDER — SUCRALFATE 1 GM/10ML PO SUSP
1.0000 g | Freq: Three times a day (TID) | ORAL | 0 refills | Status: DC
Start: 2023-08-09 — End: 2023-08-25

## 2023-08-09 MED ORDER — ALUM & MAG HYDROXIDE-SIMETH 200-200-20 MG/5ML PO SUSP
30.0000 mL | Freq: Once | ORAL | Status: AC
  Administered 2023-08-09: 30 mL via ORAL
  Filled 2023-08-09: qty 30

## 2023-08-09 MED ORDER — FAMOTIDINE IN NACL 20-0.9 MG/50ML-% IV SOLN
20.0000 mg | Freq: Once | INTRAVENOUS | Status: AC
  Administered 2023-08-09: 20 mg via INTRAVENOUS
  Filled 2023-08-09: qty 50

## 2023-08-09 MED ORDER — PANTOPRAZOLE SODIUM 40 MG PO TBEC: 40.0000 mg | DELAYED_RELEASE_TABLET | Freq: Two times a day (BID) | ORAL | 0 refills | Status: DC

## 2023-08-09 MED ORDER — IOHEXOL 300 MG/ML  SOLN
100.0000 mL | Freq: Once | INTRAMUSCULAR | Status: AC | PRN
  Administered 2023-08-09: 100 mL via INTRAVENOUS

## 2023-08-09 MED ORDER — LIDOCAINE VISCOUS HCL 2 % MT SOLN
15.0000 mL | Freq: Once | OROMUCOSAL | Status: AC
  Administered 2023-08-09: 15 mL via ORAL
  Filled 2023-08-09: qty 15

## 2023-08-09 MED ORDER — MORPHINE SULFATE (PF) 4 MG/ML IV SOLN
4.0000 mg | Freq: Once | INTRAVENOUS | Status: AC
  Administered 2023-08-09: 4 mg via INTRAVENOUS
  Filled 2023-08-09: qty 1

## 2023-08-09 NOTE — ED Provider Notes (Signed)
 [place] EMERGENCY DEPARTMENT AT [facility] Provider Note   CSN: [id number] Arrival date & time: 08/09/23  8657     History  Chief Complaint  Patient presents with   Chest Pain   Abdominal Pain    Patrick Marshall is a 78 y.o. male.  Patrick Marshall is a 78 y.o. male with history of hypertension, diabetes, hyperlipidemia, GERD and peptic ulcer disease, who presents to the ED for evaluation of epigastric abdominal pain.  Patient reports that pain starts in the epigastrium and radiates back to his back and into his chest.  He reports that he has been having these pains intermittently for the past several weeks but over the past 3 days it has become a constant burning pain that keeps him from sleeping.  He reports he gets significantly worse when eating and so he has had very poor appetite over the past few days.  He has had some nausea but with no vomiting or hematemesis.  Reports normal bowel movements with no melena or hematochezia.  He reports that sometimes the pain radiates into his chest but is not constant in the chest.  He does feel that the persistent pain has started to make him feel a bit short of breath.  He reports that he has been prescribed omeprazole  but recently has not been taking it routinely because he has not been feeling like it has helped.  He reports a history of a peptic ulcer many years ago but has not had issues like this until recently.  Denies alcohol use or any recent heavy NSAID use.  He has not tried any medications to treat his symptoms.  Previously seen by Dr. Savannah Curlin with [facility] with last endoscopy in 2023 with evidence of esophagitis and gastritis.  Spanish interpreter used to obtain history.  The history is provided by the patient and medical records. The history is limited by a language barrier. A language interpreter was used.       Home Medications Prior to Admission medications   Medication Sig Start Date End Date  Taking? Authorizing Provider  allopurinol  (ZYLOPRIM ) 100 MG tablet Take 1.5 tablets (150 mg total) by mouth daily. 04/11/21   Austine Lefort, MD  aspirin  EC 81 MG tablet Take 81 mg by mouth daily. Swallow whole.    [provider]  esomeprazole  (NEXIUM ) 40 MG capsule Take 1 capsule (40 mg total) by mouth daily. 08/14/22   Austine Lefort, MD  famotidine  (PEPCID ) 40 MG tablet Take 1 tablet (40 mg total) by mouth at bedtime. 11/07/22   Austine Lefort, MD  ketorolac  (ACULAR ) 0.5 % ophthalmic solution INSTILL 1 DROP INTO LEFT EYE TWICE DAILY 01/16/23   Austine Lefort, MD  latanoprost (XALATAN) 0.005 % ophthalmic solution Place 1 drop into the left eye daily. 05/04/20   [provider]  lisinopril  (ZESTRIL ) 10 MG tablet Take 1 tablet (10 mg total) by mouth daily. 02/27/23   Austine Lefort, MD  metFORMIN  (GLUCOPHAGE ) 500 MG tablet Take 1 tablet (500 mg total) by mouth 2 (two) times daily. 02/27/23   Austine Lefort, MD  nitroGLYCERIN  (NITROSTAT ) 0.4 MG SL tablet DISSOLVE ONE TABLET UNDER THE TONGUE EVERY 5 MINUTES AS NEEDED FOR CHEST PAIN.  DO NOT EXCEED A TOTAL OF 3 DOSES IN 15 MINUTES 01/02/22   Austine Lefort, MD  omeprazole  (PRILOSEC) 20 MG capsule Take by mouth. 10/23/22 11/22/22  [provider]  omeprazole  (PRILOSEC) 40 MG capsule Take  1 capsule (40 mg total) by mouth 2 (two) times daily. 11/07/22   Austine Lefort, MD  tamsulosin  (FLOMAX ) 0.4 MG CAPS capsule Take 1 capsule (0.4 mg total) by mouth daily. 04/19/22   Austine Lefort, MD      Allergies    Penicillins    Review of Systems   Review of Systems  Constitutional:  Negative for chills and fever.  HENT: Negative.    Respiratory:  Positive for shortness of breath. Negative for cough.   Cardiovascular:  Positive for chest pain.  Gastrointestinal:  Positive for abdominal pain and nausea. Negative for blood in stool, constipation, diarrhea and vomiting.    Physical Exam Updated Vital Signs BP (!)  174/112   Pulse 93   Temp (!) 97.5 F (36.4 C)   Resp 18   Ht 5\' 5"  (1.651 m)   Wt 99.8 kg   SpO2 99%   BMI 36.61 kg/m  Physical Exam  ED Results / Procedures / Treatments   Labs (all labs ordered are listed, but only abnormal results are displayed) Labs Reviewed  BASIC METABOLIC PANEL WITH GFR - Abnormal; Notable for the following components:      Result Value   Glucose, Bld 173 (*)    All other components within normal limits  CBC - Abnormal; Notable for the following components:   Hemoglobin 17.2 (*)    All other components within normal limits  HEPATIC FUNCTION PANEL - Abnormal; Notable for the following components:   Total Bilirubin 1.3 (*)    Bilirubin, Direct 0.3 (*)    Indirect Bilirubin 1.0 (*)    All other components within normal limits  LIPASE, BLOOD  TROPONIN I (HIGH SENSITIVITY)  TROPONIN I (HIGH SENSITIVITY)    EKG EKG Interpretation Date/Time:  Thursday Aug 09 2023 06:31:00 EDT Ventricular Rate:  87 PR Interval:  155 QRS Duration:  91 QT Interval:  364 QTC Calculation: 438 R Axis:   -24  Text Interpretation: Sinus rhythm Paired ventricular premature complexes Borderline left axis deviation Confirmed by Angela Kell [phone]) on 08/09/2023 8:12:46 AM  Radiology CT ABDOMEN PELVIS W CONTRAST Result Date: 08/09/2023 CLINICAL DATA:  Epigastric pain mid sternal abdominal and chest pain EXAM: CT ABDOMEN AND PELVIS WITH CONTRAST TECHNIQUE: Multidetector CT imaging of the abdomen and pelvis was performed using the standard protocol following bolus administration of intravenous contrast. RADIATION DOSE REDUCTION: This exam was performed according to the departmental dose-optimization program which includes automated exposure control, adjustment of the mA and/or kV according to patient size and/or use of iterative reconstruction technique. CONTRAST:  OMNIPAQUE  IOHEXOL  300 MG/ML  SOLN COMPARISON:  CT abdomen and pelvis January 27, 2012 FINDINGS: Lower chest: No  infiltrates or consolidations, no pleural effusions Hepatobiliary: Prior cholecystectomy. Liver unremarkable without parenchymal lesions or biliary dilatation. Pancreas: Pancreas normal size. No masses calcifications or inflammatory changes. Spleen: Spleen normal size.  No masses. Adrenals/Urinary Tract: Adrenal glands are normal size. Follow-up recommended. Kidneys are normal. No masses calcifications or hydronephrosis Stomach/Bowel: No small or large bowel obstruction or inflammatory changes. Moderate amount of residual fecal material throughout the colon without obstruction or constipation. Vascular/Lymphatic: No significant vascular findings are present. No enlarged abdominal or pelvic lymph nodes. Reproductive: Enlarged prostatic gland correlate clinically. Other: Anterior abdominal wall unremarkable without evidence of umbilical or inguinal hernias Musculoskeletal: Visualized portion of the thoracolumbar spine and pelvic structures grossly unremarkable without evidence of fracture bony abnormalities or soft tissue masses. IMPRESSION: *No acute findings in the abdomen or  pelvis. *Moderate amount of residual fecal material throughout the colon without obstruction or constipation. *Enlarged prostatic gland correlate clinically. Electronically Signed   By: Fredrich Jefferson M.D.   On: 08/09/2023 08:10   DG Chest 2 View Result Date: 08/09/2023 CLINICAL DATA:  78 year old male with midsternal chest and abdominal pain. Burning pain and difficulty breathing. EXAM: CHEST - 2 VIEW COMPARISON:  Portable chest 10/18/2022 and earlier. FINDINGS: AP and lateral views of the chest 0647 hours. Lower lung volumes on all three views. Allowing for this cardiac and mediastinal contours are stable. No definite cardiomegaly. Visualized tracheal air column is within normal limits. No pneumothorax or pulmonary edema. Crowding of lung markings with no consolidation or effusion. Negative visible bowel gas. Stable cholecystectomy clips.  No evidence of pneumoperitoneum. No acute osseous abnormality identified. IMPRESSION: Low lung volumes with no acute cardiopulmonary abnormality. Chronic cholecystectomy with normal visible bowel gas. Electronically Signed   By: Marlise Simpers M.D.   On: 08/09/2023 06:53     Procedures Procedures    Medications Ordered in ED Medications - No data to display  ED Course/ Medical Decision Making/ A&P                                 Medical Decision Making Amount and/or Complexity of Data Reviewed Labs: ordered. Radiology: ordered.  Risk OTC drugs. Prescription drug management.   78 y.o. male presents to the ED with complaints of epigastric pain, this involves an extensive number of treatment options, and is a complaint that carries with it a high risk of complications and morbidity.  The differential diagnosis includes gastritis, peptic ulcer disease, ACS, AAA, aortic dissection, PE, pneumonia, CHF, pancreatitis, hepatitis, liver diease.  On arrival pt is nontoxic, vitals notable for HTN, but otherwise stable. Exam significant for epigastric tenderness  Additional history obtained from medical records. Previous records obtained and reviewed   I ordered medication incluing IV Pepcid , protonic, carafate  and GI cocktail  Lab Tests:  I Ordered, reviewed, and interpreted labs, which included: Stable hemoglobin which does not suggest any GI bleeding, no leukocytosis, normal LFTs and renal function and no electrolyte derangements, normal lipase.  Despite pain radiating into the chest patient has negative troponins x 2 and EKG without ischemic changes which is reassuring  Imaging Studies ordered:  I ordered imaging studies which included chest x-ray and CT abdomen pelvis, I independently visualized and interpreted imaging which showed no acute cardiopulmonary disease and CT scan without evidence of perforation or obstruction or other acute intra-abdominal issues.  Abdominal aorta is of normal  caliber.  Mild constipation noted.  ED Course:   After medication patient reports some improvement in burning discomfort given reassuring workup I feel this is most likely gastritis and/or peptic ulcer disease likely worsened by recent discontinuation of PPI therapy.  Patient has noted history of gastritis.  I am reassured that he has already had some improvement with medications here today.  I had extensive conversation with patient using Spanish translator discussing medication regimen to help improve symptoms including twice daily PPI, Carafate  with meals and at bedtime and Maalox as needed for breakthrough symptoms.  I discussed at length with patient that not taking these medications routinely will likely make them ineffective also discussed avoiding NSAIDs and alcohol and dietary modifications to reduce symptoms.  Expressed the importance of close follow-up with GI and primary care and strict return precautions.  Answered all patient's questions utilizing  interpreter.  At this time patient is stable for discharge home.    Portions of this note were generated with Scientist, clinical (histocompatibility and immunogenetics). Dictation errors may occur despite best attempts at proofreading.         Final Clinical Impression(s) / ED Diagnoses Final diagnoses:  Epigastric pain    Rx / DC Orders ED Discharge Orders          Ordered    pantoprazole  (PROTONIX ) 40 MG tablet  2 times daily before meals        08/09/23 1135    sucralfate  (CARAFATE ) 1 GM/10ML suspension  3 times daily with meals & bedtime        08/09/23 1135              Everlyn Hockey, PA-C 08/15/23 2121    Burnette Carte, MD 08/21/23 1558

## 2023-08-09 NOTE — Discharge Instructions (Addendum)
 The pain and burning you have been experiencing is most likely due to a stomach ulcer or inflammation and irritation of the lining of your stomach.  To treat this I would like for you to stop taking the omeprazole /esomeprazole  and begin taking Protonix  1 tablet twice daily 30 minutes to an hour before breakfast and dinner.  It can take time for this medication to work and it is very important that you take it regularly even if you are still experiencing the burning pain because the medication must be taken consistently to be effective.  This medicine helps to reduce the acid that your stomach is making so that the irritation or ulcer can heal.  To help reduce pain and protect your stomach you can take prescribed Carafate  before meals and at bedtime.  If you continue to have breakthrough pain or symptoms you can also use over-the-counter Maalox as needed for burning stomach pains.  Changes to your diet can also help improve the symptoms.  Please avoid spicy or acidic foods such as foods with lots of citrus or tomato sauce, also avoid coffee, sodas and alcohol.  You should also avoid any NSAID medications such as aspirin , Goody powder, ibuprofen, Advil, Motrin or Aleve.  Please call to schedule close follow-up appointment with your primary care doctor so they can check in on this pain and see if it is improving.  I would also like to you to schedule an appointment with the GI specialist at the phone number provided on your paperwork today.  If you have significantly worsened abdominal pain, vomiting or blood in your vomit or notice blood in your stool or dark black stools please return to the emergency department.  -------------------------------------------------------------------------  El dolor y el ardor que ha estado experimentando probablemente se deban a una lcera estomacal o a una inflamacin e irritacin del revestimiento del estmago. Para tratar esto, le sugerimos que deje de tomar  omeprazol/esomeprazol y comience a tomar Protonix , una tableta [employer] al da, de 30 minutos a una hora antes del desayuno y Physicist, medical. Este medicamento puede tardar un tiempo en [employer] y es muy importante que lo tome regularmente, incluso si an experimenta ardor, ya que debe tomarse de forma constante para que sea [place]. Este medicamento ayuda a reducir la produccin de cido estomacal para que la irritacin o la lcera puedan sanar. Para ayudar a reducir Chief Technology Officer y proteger su estmago, puede tomar Carafate , recetado por su mdico, antes de las comidas y antes de acostarse. Si contina con dolor o sntomas irruptivos, tambin puede usar Maalox de venta libre segn sea necesario para el ardor de estmago.  Los [employer] en su dieta tambin pueden ayudar a [employer] sntomas. Evite los alimentos picantes o cidos, como los que contienen muchos ctricos o salsa de tomate, y evite tambin el caf, los refrescos y el alcohol. Tambin debe evitar cualquier medicamento AINE como aspirina, Goody Powder, ibuprofeno, Advil, Motrin o Aleve.  Llame para programar una cita de seguimiento con su mdico de cabecera para que pueda evaluar el dolor y ver si est mejorando. Tambin me gustara que programara hoy mismo una cita con el gastroenterlogo al nmero de telfono que figura en su documentacin.  Si el dolor abdominal ha [employer], presenta vmitos o sangre en el vmito, o si observa [employer] o heces de color negro oscuro, acuda de nuevo a urgencias.

## 2023-08-09 NOTE — ED Notes (Signed)
 RN called lab to add on hepatic function panel

## 2023-08-09 NOTE — ED Triage Notes (Signed)
 Pt ambulatory to triage with c/o mid sternal chest and abdominal pain that is burning. Pt reports difficulty breathing that is worse when he lays down. He states it feels like it goes up from his abdomen to his throat. Pt endorses nausea with no vomiting. Pt reports symptoms started on Tuesday. Hx of GERD

## 2023-08-13 ENCOUNTER — Other Ambulatory Visit: Payer: Self-pay | Admitting: Family Medicine

## 2023-08-13 NOTE — Telephone Encounter (Signed)
 Copied from CRM [id number]. Topic: Clinical - Medication Refill >> Aug 13, 2023 12:45 PM Emylou G wrote: Medication: sucralfate  (CARAFATE ) 1 GM/10ML suspension  Has the patient contacted their pharmacy? Yes (Agent: If no, request that the patient contact the pharmacy for the refill. If patient does not wish to contact the pharmacy document the reason why and proceed with request.) (Agent: If yes, when and what did the pharmacy advise?) said to call us   This is the patient's preferred pharmacy:  [id number] - [place] ([place]), [place] - [address] [place] ([place])  [address] Phone: [phone] Fax: [phone]  Is this the correct pharmacy for this prescription? Yes If no, delete pharmacy and type the correct one.   Has the prescription been filled recently? No  Is the patient out of the medication? Yes  Has the patient been seen for an appointment in the last year OR does the patient have an upcoming appointment? Yes  Can we respond through MyChart? Yes  Agent: Please be advised that Rx refills may take up to 3 business days. We ask that you follow-up with your pharmacy.

## 2023-08-24 ENCOUNTER — Observation Stay (HOSPITAL_COMMUNITY)
Admission: EM | Admit: 2023-08-24 | Discharge: 2023-08-25 | Disposition: A | Discharge status: Home / Self Care | Attending: Emergency Medicine | Admitting: Emergency Medicine

## 2023-08-24 ENCOUNTER — Other Ambulatory Visit: Payer: Self-pay

## 2023-08-24 ENCOUNTER — Encounter (HOSPITAL_COMMUNITY): Payer: Self-pay | Admitting: Emergency Medicine

## 2023-08-24 DIAGNOSIS — Q399 Congenital malformation of esophagus, unspecified: Secondary | ICD-10-CM | POA: Insufficient documentation

## 2023-08-24 DIAGNOSIS — E785 Hyperlipidemia, unspecified: Secondary | ICD-10-CM | POA: Diagnosis not present

## 2023-08-24 DIAGNOSIS — Z87891 Personal history of nicotine dependence: Secondary | ICD-10-CM | POA: Insufficient documentation

## 2023-08-24 DIAGNOSIS — Z7982 Long term (current) use of aspirin: Secondary | ICD-10-CM | POA: Insufficient documentation

## 2023-08-24 DIAGNOSIS — E119 Type 2 diabetes mellitus without complications: Secondary | ICD-10-CM

## 2023-08-24 DIAGNOSIS — K229 Disease of esophagus, unspecified: Secondary | ICD-10-CM | POA: Diagnosis not present

## 2023-08-24 DIAGNOSIS — Z8719 Personal history of other diseases of the digestive system: Secondary | ICD-10-CM

## 2023-08-24 DIAGNOSIS — R1013 Epigastric pain: Secondary | ICD-10-CM | POA: Diagnosis not present

## 2023-08-24 DIAGNOSIS — I119 Hypertensive heart disease without heart failure: Secondary | ICD-10-CM | POA: Diagnosis not present

## 2023-08-24 DIAGNOSIS — Z79899 Other long term (current) drug therapy: Secondary | ICD-10-CM | POA: Insufficient documentation

## 2023-08-24 DIAGNOSIS — K209 Esophagitis, unspecified without bleeding: Secondary | ICD-10-CM | POA: Diagnosis present

## 2023-08-24 DIAGNOSIS — R638 Other symptoms and signs concerning food and fluid intake: Principal | ICD-10-CM

## 2023-08-24 DIAGNOSIS — K2289 Other specified disease of esophagus: Secondary | ICD-10-CM | POA: Diagnosis not present

## 2023-08-24 DIAGNOSIS — Z7984 Long term (current) use of oral hypoglycemic drugs: Secondary | ICD-10-CM | POA: Diagnosis not present

## 2023-08-24 DIAGNOSIS — R110 Nausea: Secondary | ICD-10-CM

## 2023-08-24 DIAGNOSIS — K2970 Gastritis, unspecified, without bleeding: Secondary | ICD-10-CM | POA: Diagnosis not present

## 2023-08-24 DIAGNOSIS — I10 Essential (primary) hypertension: Secondary | ICD-10-CM | POA: Diagnosis present

## 2023-08-24 DIAGNOSIS — K208 Other esophagitis without bleeding: Secondary | ICD-10-CM | POA: Diagnosis not present

## 2023-08-24 DIAGNOSIS — R9431 Abnormal electrocardiogram [ECG] [EKG]: Secondary | ICD-10-CM | POA: Diagnosis not present

## 2023-08-24 LAB — COMPREHENSIVE METABOLIC PANEL WITH GFR
ALT: 19 U/L (ref 0–44)
AST: 15 U/L (ref 15–41)
Albumin: 4 g/dL (ref 3.5–5.0)
Alkaline Phosphatase: 57 U/L (ref 38–126)
Anion gap: 9 (ref 5–15)
BUN: 20 mg/dL (ref 8–23)
CO2: 21 mmol/L — ABNORMAL LOW (ref 22–32)
Calcium: 8.8 mg/dL — ABNORMAL LOW (ref 8.9–10.3)
Chloride: 107 mmol/L (ref 98–111)
Creatinine, Ser: 1.33 mg/dL — ABNORMAL HIGH (ref 0.61–1.24)
GFR, Estimated: 55 mL/min — ABNORMAL LOW (ref >60)
Glucose, Bld: 139 mg/dL — ABNORMAL HIGH (ref 70–99)
Potassium: 4.1 mmol/L (ref 3.5–5.1)
Sodium: 137 mmol/L (ref 135–145)
Total Bilirubin: 1.2 mg/dL (ref 0.0–1.2)
Total Protein: 6.7 g/dL (ref 6.5–8.1)

## 2023-08-24 LAB — GLUCOSE, CAPILLARY
Glucose-Capillary: 123 mg/dL — ABNORMAL HIGH (ref 70–99)
Glucose-Capillary: 171 mg/dL — ABNORMAL HIGH (ref 70–99)
Glucose-Capillary: 125 mg/dL — ABNORMAL HIGH (ref 70–99)

## 2023-08-24 LAB — URINALYSIS, ROUTINE W REFLEX MICROSCOPIC
Bilirubin Urine: NEGATIVE
Glucose, UA: NEGATIVE mg/dL
Hgb urine dipstick: NEGATIVE
Ketones, ur: 20 mg/dL — AB
Leukocytes,Ua: NEGATIVE
Nitrite: NEGATIVE
Protein, ur: NEGATIVE mg/dL
Specific Gravity, Urine: 1.021 (ref 1.005–1.030)
pH: 5 (ref 5.0–8.0)

## 2023-08-24 LAB — CBC
HCT: 46.1 % (ref 39.0–52.0)
Hemoglobin: 15.3 g/dL (ref 13.0–17.0)
MCH: 32.6 pg (ref 26.0–34.0)
MCHC: 33.2 g/dL (ref 30.0–36.0)
MCV: 98.1 fL (ref 80.0–100.0)
Platelets: 216 10*3/uL (ref 150–400)
RBC: 4.7 MIL/uL (ref 4.22–5.81)
RDW: 13 % (ref 11.5–15.5)
WBC: 5.9 10*3/uL (ref 4.0–10.5)
nRBC: 0 % (ref 0.0–0.2)

## 2023-08-24 LAB — CBC WITH DIFFERENTIAL/PLATELET
Abs Immature Granulocytes: 0.03 10*3/uL (ref 0.00–0.07)
Basophils Absolute: 0 10*3/uL (ref 0.0–0.1)
Basophils Relative: 1 %
Eosinophils Absolute: 0 10*3/uL (ref 0.0–0.5)
Eosinophils Relative: 1 %
HCT: 48.3 % (ref 39.0–52.0)
Hemoglobin: 16.2 g/dL (ref 13.0–17.0)
Immature Granulocytes: 1 %
Lymphocytes Relative: 26 %
Lymphs Abs: 1.6 10*3/uL (ref 0.7–4.0)
MCH: 32.3 pg (ref 26.0–34.0)
MCHC: 33.5 g/dL (ref 30.0–36.0)
MCV: 96.2 fL (ref 80.0–100.0)
Monocytes Absolute: 0.5 10*3/uL (ref 0.1–1.0)
Monocytes Relative: 7 %
Neutro Abs: 4.2 10*3/uL (ref 1.7–7.7)
Neutrophils Relative %: 64 %
Platelets: 235 10*3/uL (ref 150–400)
RBC: 5.02 MIL/uL (ref 4.22–5.81)
RDW: 13 % (ref 11.5–15.5)
WBC: 6.4 10*3/uL (ref 4.0–10.5)
nRBC: 0 % (ref 0.0–0.2)

## 2023-08-24 LAB — CREATININE, SERUM
Creatinine, Ser: 1.33 mg/dL — ABNORMAL HIGH (ref 0.61–1.24)
GFR, Estimated: 55 mL/min — ABNORMAL LOW (ref >60)

## 2023-08-24 LAB — LIPASE, BLOOD: Lipase: 32 U/L (ref 11–51)

## 2023-08-24 MED ORDER — TRAZODONE HCL 50 MG PO TABS
25.0000 mg | ORAL_TABLET | Freq: Every evening | ORAL | Status: DC | PRN
  Administered 2023-08-24: 25 mg via ORAL
  Filled 2023-08-24: qty 1

## 2023-08-24 MED ORDER — LIDOCAINE VISCOUS HCL 2 % MT SOLN
15.0000 mL | Freq: Once | OROMUCOSAL | Status: AC
  Administered 2023-08-24: 15 mL via OROMUCOSAL
  Filled 2023-08-24: qty 15

## 2023-08-24 MED ORDER — PANTOPRAZOLE SODIUM 40 MG IV SOLR
40.0000 mg | Freq: Once | INTRAVENOUS | Status: AC
  Administered 2023-08-24: 40 mg via INTRAVENOUS
  Filled 2023-08-24: qty 10

## 2023-08-24 MED ORDER — PANTOPRAZOLE SODIUM 40 MG IV SOLR
40.0000 mg | INTRAVENOUS | Status: DC
  Administered 2023-08-25: 40 mg via INTRAVENOUS
  Filled 2023-08-24: qty 10

## 2023-08-24 MED ORDER — ALUM & MAG HYDROXIDE-SIMETH 200-200-20 MG/5ML PO SUSP
30.0000 mL | Freq: Once | ORAL | Status: AC
  Administered 2023-08-24: 30 mL via ORAL
  Filled 2023-08-24: qty 30

## 2023-08-24 MED ORDER — SODIUM CHLORIDE 0.9 % IV BOLUS
1000.0000 mL | Freq: Once | INTRAVENOUS | Status: AC
  Administered 2023-08-24: 1000 mL via INTRAVENOUS

## 2023-08-24 MED ORDER — ENOXAPARIN SODIUM 40 MG/0.4ML IJ SOSY: 40.0000 mg | PREFILLED_SYRINGE | INTRAMUSCULAR | Status: DC

## 2023-08-24 MED ORDER — ONDANSETRON HCL 4 MG/2ML IJ SOLN
4.0000 mg | Freq: Four times a day (QID) | INTRAMUSCULAR | Status: DC | PRN
  Administered 2023-08-24 – 2023-08-25 (×2): 4 mg via INTRAVENOUS
  Filled 2023-08-24 (×2): qty 2

## 2023-08-24 MED ORDER — OXYCODONE HCL 5 MG PO TABS
5.0000 mg | ORAL_TABLET | ORAL | Status: DC | PRN
  Administered 2023-08-24 – 2023-08-25 (×3): 5 mg via ORAL
  Filled 2023-08-24 (×3): qty 1

## 2023-08-24 MED ORDER — ACETAMINOPHEN 650 MG RE SUPP: 650.0000 mg | Freq: Four times a day (QID) | RECTAL | Status: DC | PRN

## 2023-08-24 MED ORDER — ACETAMINOPHEN 325 MG PO TABS: 650.0000 mg | ORAL_TABLET | Freq: Four times a day (QID) | ORAL | Status: DC | PRN

## 2023-08-24 MED ORDER — INSULIN ASPART 100 UNIT/ML IJ SOLN
0.0000 [IU] | Freq: Three times a day (TID) | INTRAMUSCULAR | Status: DC
  Administered 2023-08-24: 1 [IU] via SUBCUTANEOUS
  Administered 2023-08-24: 3 [IU] via SUBCUTANEOUS
  Filled 2023-08-24: qty 0.15

## 2023-08-24 MED ORDER — ONDANSETRON HCL 4 MG PO TABS: 4.0000 mg | ORAL_TABLET | Freq: Four times a day (QID) | ORAL | Status: DC | PRN

## 2023-08-24 MED ORDER — POLYETHYLENE GLYCOL 3350 17 G PO PACK
17.0000 g | PACK | Freq: Two times a day (BID) | ORAL | Status: DC
  Administered 2023-08-24 – 2023-08-25 (×2): 17 g via ORAL
  Filled 2023-08-24 (×2): qty 1

## 2023-08-24 MED ORDER — POTASSIUM CHLORIDE IN NACL 20-0.9 MEQ/L-% IV SOLN
INTRAVENOUS | Status: AC
  Filled 2023-08-24: qty 1000

## 2023-08-24 MED ORDER — ENOXAPARIN SODIUM 60 MG/0.6ML IJ SOSY
50.0000 mg | PREFILLED_SYRINGE | INTRAMUSCULAR | Status: DC
  Administered 2023-08-24: 50 mg via SUBCUTANEOUS
  Filled 2023-08-24: qty 0.6

## 2023-08-24 MED ORDER — SODIUM CHLORIDE 0.9 % IV SOLN: INTRAVENOUS | Status: DC

## 2023-08-24 MED ORDER — POLYETHYLENE GLYCOL 3350 17 G PO PACK: 17.0000 g | PACK | Freq: Every day | ORAL | Status: DC | PRN

## 2023-08-24 MED ORDER — SODIUM CHLORIDE 0.9% FLUSH
3.0000 mL | Freq: Two times a day (BID) | INTRAVENOUS | Status: DC
  Administered 2023-08-24 – 2023-08-25 (×3): 3 mL via INTRAVENOUS

## 2023-08-24 MED ORDER — ONDANSETRON HCL 4 MG/2ML IJ SOLN
4.0000 mg | Freq: Once | INTRAMUSCULAR | Status: AC
  Administered 2023-08-24: 4 mg via INTRAVENOUS
  Filled 2023-08-24: qty 2

## 2023-08-24 MED ORDER — SUCRALFATE 1 G PO TABS
1.0000 g | ORAL_TABLET | Freq: Once | ORAL | Status: AC
  Administered 2023-08-24: 1 g via ORAL
  Filled 2023-08-24: qty 1

## 2023-08-24 NOTE — Care Management Obs Status (Signed)
 MEDICARE OBSERVATION STATUS NOTIFICATION   Patient Details  Name: Patrick Marshall MRN: [id number] Date of Birth: 1945-10-07   Medicare Observation Status Notification Given:  Yes    Levie Ream, RN 08/24/2023, 4:11 PM

## 2023-08-24 NOTE — Plan of Care (Signed)

## 2023-08-24 NOTE — H&P (Signed)
 Home History and Physical    Patrick Marshall QIO:[id number] DOB: 1945/11/24 DOA: 08/24/2023  PCP: Austine Lefort, MD  Patient coming from: Home  I have personally briefly reviewed patient's old medical records in [facility]  Chief Complaint: Epigastric abdominal pain  HPI: Patrick Marshall is a 78 y.o. male with medical history significant of type 2 diabetes, gastroesophageal reflux disease, heart disease, hypertension, and hyperlipidemia who presents to the emergency department with a chief complaint of epigastric abdominal pain.  He was seen for the same complaints 15 days ago.  He has had an endoscopy in 2023 which showed some esophagitis and gastritis and is followed by Dr. Savannah Curlin at [facility].  He has started on pantoprazole  and Carafate  as directed in the emergency department but has had no relief of his symptoms.  He states he has burning pain in his stomach that feels like a pressure that makes him have a "sharp pain in my heart" he says that the pain causes difficulty breathing.  He has been unable to tolerate food for 4 days and prior to that was able to eat bland foods like chicken and a cup.  He states that when he does try to eat over the past 4 days he has had worsening pain and then develops nausea and vomiting.  Pain is worse when he lies back.  He has been unable to sleep well.  He denies use of NSAIDs, alcohol, spicy foods, vinegar's or irritant intakes.  Patient seen with assistance of interpreter obtained at phone [phone]  ED Course: Patient received ondansetron , 1 L of normal saline, Protonix  IV, Carafate , and lidocaine  mouth solution.  None of these had any significant effect patient continued to have discomfort and was referred to hospital medicine for further evaluation and management.  Review of Systems: As per HPI otherwise all other systems reviewed and  negative.   Past Medical History:  Diagnosis Date   Cataract    Diabetes mellitus without  complication (HCC)    x few years.   GERD (gastroesophageal reflux disease)    Glaucoma    Gout    Heart disease    HLD (hyperlipidemia)    Hypertension     Past Surgical History:  Procedure Laterality Date   CHOLECYSTECTOMY     EYE SURGERY     2016 both eyes   UPPER GI ENDOSCOPY  2023    Social History   Social History Narrative   Not on file     reports that he quit smoking about 32 years ago. His smoking use included cigarettes. He started smoking about 35 years ago. He has never used smokeless tobacco. He reports current alcohol use. He reports that he does not use drugs.  Allergies  Allergen Reactions   Penicillins Other (See Comments)    Difficulty sleeping    Family History  Problem Relation Age of Onset   Heart disease Mother    Heart failure Father         "Heart Surgery"  died age 89   Esophageal cancer Neg Hx    Colon cancer Neg Hx    Stomach cancer Neg Hx      Prior to Admission medications   Medication Sig Start Date End Date Taking? Authorizing Provider  ketorolac  (ACULAR ) 0.5 % ophthalmic solution INSTILL 1 DROP INTO LEFT EYE TWICE DAILY 01/16/23  Yes Austine Lefort, MD  nitroGLYCERIN  (NITROSTAT ) 0.4 MG SL tablet DISSOLVE ONE TABLET UNDER THE TONGUE EVERY  5 MINUTES AS NEEDED FOR CHEST PAIN.  DO NOT EXCEED A TOTAL OF 3 DOSES IN 15 MINUTES Patient taking differently: Place 0.4 mg under the tongue every 5 (five) minutes as needed for chest pain. 01/02/22  Yes Austine Lefort, MD  aspirin  EC 81 MG tablet Take 81 mg by mouth daily. Swallow whole. Patient not taking: Reported on 08/24/2023    [provider]  lisinopril  (ZESTRIL ) 10 MG tablet Take 1 tablet (10 mg total) by mouth daily. Patient not taking: Reported on 08/24/2023 02/27/23   Austine Lefort, MD  metFORMIN  (GLUCOPHAGE ) 500 MG tablet Take 1 tablet (500 mg total) by mouth 2 (two) times daily. Patient not taking: Reported on 08/24/2023 02/27/23   Austine Lefort, MD  pantoprazole   (PROTONIX ) 40 MG tablet Take 1 tablet (40 mg total) by mouth 2 (two) times daily before a meal. Patient not taking: Reported on 08/24/2023 08/09/23   Kehrli, Kelsey F, PA-C  sucralfate  (CARAFATE ) 1 GM/10ML suspension Take 10 mLs (1 g total) by mouth 4 (four) times daily -  with meals and at bedtime. Patient not taking: Reported on 08/24/2023 08/09/23   Everlyn Hockey, PA-C    Physical Exam:  Constitutional: NAD, calm, comfortable Vitals:   08/24/23 0800 08/24/23 0830 08/24/23 0930 08/24/23 1139  BP: (!) 160/94 (!) 138/92 (!) 143/95 (!) 137/95  Pulse: 70 65 73 75  Resp:  16 16 18   Temp:   98 F (36.7 C) 98.3 F (36.8 C)  TempSrc:   Oral Oral  SpO2: 99% 92% 93% 98%   Eyes: PERRL, lids and conjunctivae normal ENMT: Mucous membranes are moist. Posterior pharynx clear of any exudate or lesions.Normal dentition.  Neck: normal, supple, no masses, no thyromegaly Respiratory: clear to auscultation bilaterally, no wheezing, no crackles. Normal respiratory effort. No accessory muscle use.  Cardiovascular: Regular rate and rhythm, no murmurs / rubs / gallops. No extremity edema. 2+ pedal pulses. No carotid bruits.  Abdomen: no tenderness, no masses palpated. No hepatosplenomegaly. Bowel sounds positive.  Musculoskeletal: no clubbing / cyanosis. No joint deformity upper and lower extremities. Good ROM, no contractures. Normal muscle tone.  Skin: no rashes, lesions, ulcers. No induration Neurologic: CN 2-12 grossly intact. Sensation intact, DTR normal. Strength 5/5 in all 4.  Psychiatric: Normal judgment and insight. Alert and oriented x 3. Normal mood.    Labs on Admission: I have personally reviewed following labs and imaging studies  CBC: Recent Labs  Lab 08/24/23 0642 08/24/23 1229  WBC 6.4 5.9  NEUTROABS 4.2  --   HGB 16.2 15.3  HCT 48.3 46.1  MCV 96.2 98.1  PLT 235 216   Basic Metabolic Panel: Recent Labs  Lab 08/24/23 0642 08/24/23 1229  NA 137  --   K 4.1  --   CL 107  --    CO2 21*  --   GLUCOSE 139*  --   BUN 20  --   CREATININE 1.33* 1.33*  CALCIUM  8.8*  --    GFR: CrCl cannot be calculated (Unknown ideal weight.). Liver Function Tests: Recent Labs  Lab 08/24/23 0642  AST 15  ALT 19  ALKPHOS 57  BILITOT 1.2  PROT 6.7  ALBUMIN 4.0   Recent Labs  Lab 08/24/23 0642  LIPASE 32    Urine analysis:    Component Value Date/Time   COLORURINE YELLOW 08/24/2023 0748   APPEARANCEUR CLEAR 08/24/2023 0748   LABSPEC 1.021 08/24/2023 0748   PHURINE 5.0 08/24/2023 0748   GLUCOSEU  NEGATIVE 08/24/2023 0748   HGBUR NEGATIVE 08/24/2023 0748   BILIRUBINUR NEGATIVE 08/24/2023 0748   KETONESUR 20 (A) 08/24/2023 0748   PROTEINUR NEGATIVE 08/24/2023 0748   UROBILINOGEN 1.0 01/27/2012 1317   NITRITE NEGATIVE 08/24/2023 0748   LEUKOCYTESUR NEGATIVE 08/24/2023 0748    Radiological Exams on Admission: No results found.  EKG: Independently reviewed.  Shows sinus rhythm with borderline left axis deviation.  Assessment/Plan Principal Problem:   Esophageal pain Active Problems:   Type 2 diabetes mellitus without complication, without long-term current use of insulin (HCC)   Hypertension   Hyperlipidemia   Esophagitis determined by endoscopy   1.  Esophageal pain with history of esophagitis determined by endoscopy: -Place in overnight observation -GI has been consulted we will plan to do an EGD in a.m. - Continue IV Protonix  and Carafate  - Monitor for worsening symptoms  2.  Diabetes type 2 without long-term use of insulin: - Check fingerstick blood glucoses every 4 hours with sliding scale coverage - Bland liquid carbohydrate controlled diet - Check hemoglobin A1c  3.  Hypertension: - Monitor blood pressures - Patient currently reports no home medications - Will start medications if indicated    DVT prophylaxis: Enoxaparin Code Status: Full code Family Communication: Patient retains capacity no family present at admission Disposition  Plan: Likely home Consults called: GI Admission status: Observation   Ronny Colas MD FACP [facility] Pager [phone]  How to contact the [facility] Attending or Consulting provider 7A - 7P or covering provider during after hours 7P -7A, for this patient?  Check the care team in [facility] and look for a) attending/consulting [facility] provider listed and b) the [facility] team listed Log into [email or web address] and use [facility] universal password to access. If you do not have the password, please contact the hospital operator. Locate the [facility] provider you are looking for under [facility] and page to a number that you can be directly reached. If you still have difficulty reaching the provider, please page the [facility] (Director on Call) for the Hospitalists listed on amion for assistance.  If 7PM-7AM, please contact night-coverage [email or web address] Password [email or web address]  08/24/2023, 4:24 PM

## 2023-08-24 NOTE — Consult Note (Addendum)
 Referring Provider: Tama Fails PA-C Primary Care Physician:  Austine Lefort, MD Primary Gastroenterologist: [facility]  Reason for Consultation: Nausea and epigastric pain  HPI: Patrick Marshall is a 78 y.o. male past medical history tension, hyperlipidemia, palpitations, chronic SOB, diabetes mellitus type 2, gout and GERD.  Past cholecystectomy.  He was recently evaluated in ED 08/09/2023 due to having epigastric pain which radiates through to his back and chest with nausea and no vomiting with decreased oral intake.  Troponin levels were negative and twelve-lead EKG was negative for acute ischemia.  CTAP without acute intra-abdominal/pelvic pathology to explain his symptoms, a moderate amount of stool was noted in the colon.  He received IV Pepcid , Protonix , Carafate  and GI cocktail and his symptoms persisted.  He was discharged home on Pantoprazole  40 mg twice daily and sucralfate  1 g 3 times daily and nightly.  He presented to the ED earlier this morning with persistent nausea and persistent epigastric pain.  Unable to keep liquids or food down for the past few days.  Labs in the ED showed a WBC count of 6.4.  Hemoglobin 16.2.  Hematocrit 48.3.  Platelet 235.  Sodium 137.  Potassium 4.1.  Glucose 139.  BUN 20.  Creatinine 1.33.  Normal LFTs.  Lipase 32.  A GI consult was requested for further evaluation and possible EGD.  Patient is Spanish-speaking therefore I utilized [employer] online Spanish interpreter to facilitate communication at this time.  He endorses having nausea and epigastric pain for the past 2 weeks.  He endorsed taking Pantoprazole  40 mg twice daily for 3 days but stopped it because it was not helping him.  He took Carafate  4 times daily as prescribed for least 7 days.  He takes ASA 81 mg daily, has not taken it for 2 days due to worsening epigastric pain.  No bloody or black stools.  He underwent an EGD by Dr. Savannah Curlin 09/13/2021 secondary to having  epigastric pain, reflux symptoms, some difficulty swallowing water and a globus sensation.  The EGD identified mild esophagitis and mild chronic gastritis.  No evidence of H. pylori.  The esophagus was empirically dilated.  Postprocedure he was advised to take pantoprazole  40 mg twice daily for 8 weeks then to decrease to once daily.    He previously just declined screening colonoscopy.  No known family history of esophageal, gastric or colon cancer.  He was previously evaluated for nonexertional chest pain and shortness of breath, seen by cardiology.  Chest pain thought to be due to GERD.  ECHO 12/26/2022 showed LVEF 55 to 60%.  He denies having any chest pain at this time but describes having intermittent palpitations and sometimes has shortness of breath.  GI PROCEDURES:  EGD 09/13/2021: - Mild esophagitis but no other endoscopic esophageal abnormality to explain patient's dysphagia. Esophagus dilated. Dilated. Biopsied. - Gastritis. Biopsied. - Erythematous duodenopathy. Biopsied. 1. Surgical [P], duodenal FRAGMENTS OF DUODENAL MUCOSA WITH NORMAL VILLOUS AND CRYPT ARCHITECTURE. MILD INFLAMMATION IS SEEN BUT NO INCREASE IN INTRAEPITHELIAL LYMPHOCYTES IS NOTED. NO DYSPLASIA OR MALIGNANCY IS SEEN. NO HISTOLOGIC EVIDENCE OF CELIAC SPRUE IS SEEN. 2. Surgical [P], gastritis MILD CHRONIC GASTRITIS. NO SIGNIFICANT ACTIVITY, DYSPLASIA OR MALIGNANCY IS SEEN ALCIAN BLUE STAIN WITH APPROPRIATE CONTROLS IS NEGATIVE FOR INTESTINAL METAPLASIA. IMMUNOHISTOCHEMISTRY FOR H. PYLORI IS NEGATIVE FOR H PYLORI-LIKE ORGANISMS. 3. Surgical [P], mid esophagus and proximal esophagus FRAGMENTS OF INFLAMED SQUAMOUS AND GLANDULAR MUCOSA WITH RARE CELL SHOWING INTESTINAL METAPLASIA CONFIRMED BY ALCIAN BLUE STAIN WITH APPROPRIATE CONTROLS. NO  SIGNIFICANT NUMBER OF EOSINOPHILS ARE SEEN. NO DYSPLASIA OR MALIGNANCY IS SEEN. CLINICAL AND ENDOSCOPIC CORELATION IS REQUIRED. 4. Surgical [P], distal esophagus FRAGMENTS OF  SQUAMOUS MUCOSA SHOWING MILD INFLAMMATION.. NO SIGNIFICANT NUMBER OF EOSINOPHILS, DYSPLASIA OR MALIGNANCY IS SEEN. CLINICAL AND ENDOSCOPIC CORRELATION IS REQUIRED.    RUQ sono 03/16/2021: 1. 1.6 cm hypoechoic indeterminate right hepatic lesion. Recommend further evaluation with MRI. 2. Heterogeneous liver echotexture may be related to diffuse hepatocellular disease. Please correlate clinically. 3. Cholecystectomy.   Abdominal MRI/ 04/13/2021: 1. 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 This lesion is benign and requires no further workup.    Past Medical History:  Diagnosis Date   Cataract    Diabetes mellitus without complication (HCC)    x few years.   GERD (gastroesophageal reflux disease)    Glaucoma    Gout    Heart disease    HLD (hyperlipidemia)    Hypertension     Past Surgical History:  Procedure Laterality Date   CHOLECYSTECTOMY     EYE SURGERY     2016 both eyes   UPPER GI ENDOSCOPY  2023    Prior to Admission medications   Medication Sig Start Date End Date Taking? Authorizing Provider  allopurinol  (ZYLOPRIM ) 100 MG tablet Take 1.5 tablets (150 mg total) by mouth daily. 04/11/21   Austine Lefort, MD  aspirin  EC 81 MG tablet Take 81 mg by mouth daily. Swallow whole.    [provider]  famotidine  (PEPCID ) 40 MG tablet Take 1 tablet (40 mg total) by mouth at bedtime. 11/07/22   Austine Lefort, MD  ketorolac  (ACULAR ) 0.5 % ophthalmic solution INSTILL 1 DROP INTO LEFT EYE TWICE DAILY 01/16/23   Austine Lefort, MD  latanoprost (XALATAN) 0.005 % ophthalmic solution Place 1 drop into the left eye daily. 05/04/20   [provider]  lisinopril  (ZESTRIL ) 10 MG tablet Take 1 tablet (10 mg total) by mouth daily. 02/27/23   Austine Lefort, MD  metFORMIN  (GLUCOPHAGE ) 500 MG tablet Take 1 tablet (500 mg total) by mouth 2 (two) times daily. 02/27/23   Austine Lefort, MD  nitroGLYCERIN  (NITROSTAT ) 0.4 MG SL tablet DISSOLVE ONE TABLET UNDER THE TONGUE  EVERY 5 MINUTES AS NEEDED FOR CHEST PAIN.  DO NOT EXCEED A TOTAL OF 3 DOSES IN 15 MINUTES 01/02/22   Austine Lefort, MD  pantoprazole  (PROTONIX ) 40 MG tablet Take 1 tablet (40 mg total) by mouth 2 (two) times daily before a meal. 08/09/23   Everlyn Hockey, PA-C  sucralfate  (CARAFATE ) 1 GM/10ML suspension Take 10 mLs (1 g total) by mouth 4 (four) times daily -  with meals and at bedtime. 08/09/23   Kehrli, Kelsey F, PA-C  tamsulosin  (FLOMAX ) 0.4 MG CAPS capsule Take 1 capsule (0.4 mg total) by mouth daily. 04/19/22   Austine Lefort, MD    No current facility-administered medications for this encounter.   Current Outpatient Medications  Medication Sig Dispense Refill   allopurinol  (ZYLOPRIM ) 100 MG tablet Take 1.5 tablets (150 mg total) by mouth daily. 180 tablet 3   aspirin  EC 81 MG tablet Take 81 mg by mouth daily. Swallow whole.     famotidine  (PEPCID ) 40 MG tablet Take 1 tablet (40 mg total) by mouth at bedtime. 90 tablet 3   ketorolac  (ACULAR ) 0.5 % ophthalmic solution INSTILL 1 DROP INTO LEFT EYE TWICE DAILY 5 mL 0   latanoprost (XALATAN) 0.005 % ophthalmic solution Place 1 drop into the left eye daily.  lisinopril  (ZESTRIL ) 10 MG tablet Take 1 tablet (10 mg total) by mouth daily. 30 tablet 0   metFORMIN  (GLUCOPHAGE ) 500 MG tablet Take 1 tablet (500 mg total) by mouth 2 (two) times daily. 60 tablet 0   nitroGLYCERIN  (NITROSTAT ) 0.4 MG SL tablet DISSOLVE ONE TABLET UNDER THE TONGUE EVERY 5 MINUTES AS NEEDED FOR CHEST PAIN.  DO NOT EXCEED A TOTAL OF 3 DOSES IN 15 MINUTES 50 tablet 0   pantoprazole  (PROTONIX ) 40 MG tablet Take 1 tablet (40 mg total) by mouth 2 (two) times daily before a meal. 60 tablet 0   sucralfate  (CARAFATE ) 1 GM/10ML suspension Take 10 mLs (1 g total) by mouth 4 (four) times daily -  with meals and at bedtime. 473 mL 0   tamsulosin  (FLOMAX ) 0.4 MG CAPS capsule Take 1 capsule (0.4 mg total) by mouth daily. 90 capsule 3    Allergies as of 08/24/2023 - Review Complete  08/24/2023  Allergen Reaction Noted   Penicillins Other (See Comments) 01/27/2012    Family History  Problem Relation Age of Onset   Heart disease Mother    Heart failure Father         "Heart Surgery"  died age 61   Esophageal cancer Neg Hx    Colon cancer Neg Hx    Stomach cancer Neg Hx     Social History   Socioeconomic History   Marital status: Married    Spouse name: Not on file   Number of children: 5   Years of education: Not on file   Highest education level: Not on file  Occupational History   Occupation: IT trainer  Tobacco Use   Smoking status: Former    Current packs/day: 0.00    Types: Cigarettes    Start date: 1990    Quit date: 1993    Years since quitting: 32.4   Smokeless tobacco: Never  Vaping Use   Vaping status: Never Used  Substance and Sexual Activity   Alcohol use: Yes    Comment: occasional   Drug use: No   Sexual activity: Not on file  Other Topics Concern   Not on file  Social History Narrative   Not on file   Social Drivers of Health   Financial Resource Strain: Not on file  Food Insecurity: Not on file  Transportation Needs: No Transportation Needs (10/27/2022)   PRAPARE - Administrator, Civil Service (Medical): No    Lack of Transportation (Non-Medical): No  Physical Activity: Not on file  Stress: Not on file  Social Connections: Not on file  Intimate Partner Violence: Not on file   Review of Systems: Gen: Denies fever, sweats or chills. No weight loss.  CV: Denies chest pain, palpitations or edema. Resp: Denies cough, shortness of breath of hemoptysis.  GI: See HPI. GU : Denies urinary burning, blood in urine, increased urinary frequency or incontinence. MS: Denies joint pain, muscles aches or weakness. Derm: Denies rash, itchiness, skin lesions or unhealing ulcers. Psych: Denies depression, anxiety, memory loss or confusion. Heme: Denies easy bruising, bleeding. Neuro:  Denies headaches, dizziness or  paresthesias. Endo:  + DM type II.  Physical Exam: Vital signs in last 24 hours: Temp:  [98 F (36.7 C)] 98 F (36.7 C) (05/30 0557) Pulse Rate:  [65-87] 65 (05/30 0830) Resp:  [16] 16 (05/30 0830) BP: (130-160)/(89-106) 138/92 (05/30 0830) SpO2:  [92 %-99 %] 92 % (05/30 0830)   General: Alert 78 year old male hard of hearing in  no acute distress. Head:  Normocephalic and atraumatic. Eyes:  No scleral icterus. Conjunctiva pink. Ears:  Normal auditory acuity. Nose:  No deformity, discharge or lesions. Mouth:  Dentition intact. No ulcers or lesions.  Neck:  Supple. No lymphadenopathy or thyromegaly.  Lungs: Breath sounds clear throughout. No wheezes, rhonchi or crackles.  Heart: Regular rate and rhythm, no murmurs. Abdomen: Protuberant, mild epigastric tenderness without rebound or guarding.  Positive bowel sounds to all 4 quadrants. Rectal: Deferred. Musculoskeletal:  Symmetrical without gross deformities.  Pulses:  Normal pulses noted. Extremities: No edema. Neurologic:  Alert and  oriented x 4. No focal deficits.  Skin:  Intact without significant lesions or rashes. Psych:  Alert and cooperative. Normal mood and affect.  Intake/Output from previous day: No intake/output data recorded. Intake/Output this shift: No intake/output data recorded.  Lab Results: Recent Labs    08/24/23 0642  WBC 6.4  HGB 16.2  HCT 48.3  PLT 235   BMET Recent Labs    08/24/23 0642  NA 137  K 4.1  CL 107  CO2 21*  GLUCOSE 139*  BUN 20  CREATININE 1.33*  CALCIUM  8.8*   LFT Recent Labs    08/24/23 0642  PROT 6.7  ALBUMIN 4.0  AST 15  ALT 19  ALKPHOS 57  BILITOT 1.2   PT/INR No results for input(s): "LABPROT", "INR" in the last 72 hours. Hepatitis Panel No results for input(s): "HEPBSAG", "HCVAB", "HEPAIGM", "HEPBIGM" in the last 72 hours.    Studies/Results: No results found.  IMPRESSION/PLAN:  78 year old male with nausea and epigastric pain x 2 weeks with  decreased oral intake. On ASA at home, last dose was 2 days ago. CTAP 08/09/2023 did not identify any intra-abdominal/pelvic pathology to explain his symptoms.  EGD 08/2021 showed mild esophagitis and gastritis without evidence of H. pylori. - Clear liquid diet - NPO after midnight - EGD with Dr. Venice Gillis tomorrow benefits and risks discussed including risk with sedation, risk of bleeding, perforation and infection  - Pantoprazole  40 mg IV daily - Await further recommendations per Dr. Venice Gillis  Diabetes mellitus type 2      Patrick Marshall  08/24/2023, 11:12 AM    Attending physician's note   I have taken history, reviewed the chart and examined the patient. I performed a substantive portion of this encounter, including complete performance of at least one of the key components, in conjunction with the APP. I agree with the Advanced Practitioner's note, impression and recommendations.  Spanish-speaking and a poor historian.  Epi pain with N/V. Neg CT AP except for retained stool. Prev EGD 2023 with esophagitis/gastritis with neg HP but + IM. S/P chole in past. Nl CBC,CMP, lipase. Neg prev cardiac workup  Assoc NIDDM, HTN, HLD  Plan: -IV protonix  -EGD in AM. -If neg, further WU as outpt (GES etc) -Declined screening colon previously. -MiraLAX  17 g p.o. twice daily until large bowel movement, then QD   Magnus Schuller, MD [facility] [phone]

## 2023-08-24 NOTE — ED Provider Notes (Signed)
 [place] EMERGENCY DEPARTMENT AT [facility] Provider Note   CSN: [id number] Arrival date & time: 08/24/23  0547     History  Chief Complaint  Patient presents with   Abdominal Pain    Patrick Marshall is a 78 y.o. male who presents emergency department with chief complaint of epigastric abdominal pain.  He has a past medical history of hypertension, hyperlipidemia, type 2 diabetes, GERD and peptic ulcer disease.  Patient follows with Dr. Savannah Curlin at [facility].  Last endoscopy was in 2023 showed at that time esophagitis and gastritis.  Patient was seen for the same complaints he has today 15 days ago.  Patient states that he was started on pantoprazole  and Carafate  at his last visit however he has had no relief of his symptoms.  He reports burning pain in his stomach that feels like pressure.  He states that it makes him have "sharp pain in my heart."  And he has difficulty breathing due to the pain in his stomach.  He reports that he has not been unable to tolerate food and has been able to sip liquids however this causes him increased pain and nausea without vomiting every time he tries to ingest foods or fluids.  He has not had any real oral intake over the past 3 days and states that he is feeling weak.  His pain is worse when he lies back and he has been unable to get any good sleep.  He denies NSAID use, alcohol, spicy foods or other irritant substances.  The history is limited by a language barrier. A language interpreter was used.  Abdominal Pain      Home Medications Prior to Admission medications   Medication Sig Start Date End Date Taking? Authorizing Provider  allopurinol  (ZYLOPRIM ) 100 MG tablet Take 1.5 tablets (150 mg total) by mouth daily. 04/11/21   Austine Lefort, MD  aspirin  EC 81 MG tablet Take 81 mg by mouth daily. Swallow whole.    [provider]  famotidine  (PEPCID ) 40 MG tablet Take 1 tablet (40 mg total) by mouth at bedtime. 11/07/22    Austine Lefort, MD  ketorolac  (ACULAR ) 0.5 % ophthalmic solution INSTILL 1 DROP INTO LEFT EYE TWICE DAILY 01/16/23   Austine Lefort, MD  latanoprost (XALATAN) 0.005 % ophthalmic solution Place 1 drop into the left eye daily. 05/04/20   [provider]  lisinopril  (ZESTRIL ) 10 MG tablet Take 1 tablet (10 mg total) by mouth daily. 02/27/23   Austine Lefort, MD  metFORMIN  (GLUCOPHAGE ) 500 MG tablet Take 1 tablet (500 mg total) by mouth 2 (two) times daily. 02/27/23   Austine Lefort, MD  nitroGLYCERIN  (NITROSTAT ) 0.4 MG SL tablet DISSOLVE ONE TABLET UNDER THE TONGUE EVERY 5 MINUTES AS NEEDED FOR CHEST PAIN.  DO NOT EXCEED A TOTAL OF 3 DOSES IN 15 MINUTES 01/02/22   Austine Lefort, MD  pantoprazole  (PROTONIX ) 40 MG tablet Take 1 tablet (40 mg total) by mouth 2 (two) times daily before a meal. 08/09/23   Everlyn Hockey, PA-C  sucralfate  (CARAFATE ) 1 GM/10ML suspension Take 10 mLs (1 g total) by mouth 4 (four) times daily -  with meals and at bedtime. 08/09/23   Everlyn Hockey, PA-C  tamsulosin  (FLOMAX ) 0.4 MG CAPS capsule Take 1 capsule (0.4 mg total) by mouth daily. 04/19/22   Austine Lefort, MD      Allergies    Penicillins    Review of Systems  Review of Systems  Gastrointestinal:  Positive for abdominal pain.    Physical Exam Updated Vital Signs BP (!) 137/105   Pulse 87   Temp 98 F (36.7 C)   Resp 16   SpO2 97%  Physical Exam Vitals and nursing note reviewed.  Constitutional:      General: He is not in acute distress.    Appearance: He is well-developed. He is not diaphoretic.  HENT:     Head: Normocephalic and atraumatic.  Eyes:     General: No scleral icterus.    Conjunctiva/sclera: Conjunctivae normal.  Cardiovascular:     Rate and Rhythm: Normal rate and regular rhythm.     Heart sounds: Normal heart sounds.  Pulmonary:     Effort: Pulmonary effort is normal. No respiratory distress.     Breath sounds: Normal breath sounds.  Abdominal:      Palpations: Abdomen is soft.     Tenderness: There is abdominal tenderness in the epigastric area.  Musculoskeletal:     Cervical back: Normal range of motion and neck supple.  Skin:    General: Skin is warm and dry.  Neurological:     Mental Status: He is alert.  Psychiatric:        Behavior: Behavior normal.     ED Results / Procedures / Treatments   Labs (all labs ordered are listed, but only abnormal results are displayed) Labs Reviewed  COMPREHENSIVE METABOLIC PANEL WITH GFR - Abnormal; Notable for the following components:      Result Value   CO2 21 (*)    Glucose, Bld 139 (*)    Creatinine, Ser 1.33 (*)    Calcium  8.8 (*)    GFR, Estimated 55 (*)    All other components within normal limits  CBC WITH DIFFERENTIAL/PLATELET  LIPASE, BLOOD  URINALYSIS, ROUTINE W REFLEX MICROSCOPIC    EKG EKG Interpretation Date/Time:  Friday Aug 24 2023 06:05:19 EDT Ventricular Rate:  88 PR Interval:  154 QRS Duration:  86 QT Interval:  367 QTC Calculation: 444 R Axis:   -22  Text Interpretation: Sinus rhythm Borderline left axis deviation Borderline T wave abnormalities Confirmed by Jerald Molly [phone]) on 08/24/2023 7:20:05 AM  Radiology No results found.  Procedures Procedures    Medications Ordered in ED Medications  sodium chloride  0.9 % bolus 1,000 mL (has no administration in time range)  ondansetron  (ZOFRAN ) injection 4 mg (has no administration in time range)  pantoprazole  (PROTONIX ) injection 40 mg (40 mg Intravenous Given 08/24/23 0636)  alum & mag hydroxide-simeth (MAALOX/MYLANTA) 200-200-20 MG/5ML suspension 30 mL (30 mLs Oral Given 08/24/23 2130)  sucralfate  (CARAFATE ) tablet 1 g (1 g Oral Given 08/24/23 8657)  lidocaine  (XYLOCAINE ) 2 % viscous mouth solution 15 mL (15 mLs Mouth/Throat Given 08/24/23 8469)    ED Course/ Medical Decision Making/ A&P Clinical Course as of 08/26/23 0917  Fri Aug 24, 2023  0920 Patient reevaluated. He had temporary relief of  pain but it has returned and patient has some improvement in his nausea [AH]  0946 Case discussed with APP Dalphine Duet of [facility]- recommends admission for pain control and fluid rehydration.  They plan on performing EGD tomorrow inpatient. [AH]    Clinical Course User Index [AH] Tama Fails, PA-C                                 Medical Decision Making This patient presents to the  ED for concern of  epigastric abd pain , this involves an extensive number of treatment options, and is a complaint that carries with it a high risk of complications and morbidity.  Differential diagnosis of epigastric pain includes: Functional or nonulcer dyspepsia PUD, GERD, Gastritis,  pancreatitis or pancreatic cancer, overeating indigestion  drugs, gastroparesis, lactose intolerance, malabsorption, gastric cancer, parasitic infection,  cholelithiasis, choledocholithiasis, or cholangitis, ACS, pericarditis, pneumonia, abdominal hernia, pregnancy, intestinal ischemia, esophageal rupture, gastric volvulus, hepatitis.     Co morbidities:       has a past medical history of Cataract, Diabetes mellitus without complication (HCC), GERD (gastroesophageal reflux disease), Glaucoma, Gout, Heart disease, HLD (hyperlipidemia), and Hypertension.   Social Determinants of Health:       SDOH Screenings Transportation Needs: No Transportation Needs (10/27/2022) Alcohol Screen: Low Risk  (02/26/2018) Depression (PHQ2-9): Low Risk  (01/18/2019) Tobacco Use: Medium Risk (08/24/2023) Language Barrier  Additional history:  {Additional history obtained from emr External records from outside source obtained and reviewed including Care everywhere documents  Lab Tests:  I Ordered, and personally interpreted labs.  The pertinent results include:   No acute findings; cr slightly above normal   Imaging Studies:   Cardiac Monitoring/ECG:       The patient was maintained on a cardiac monitor.  I personally viewed  and interpreted the cardiac monitored which showed an underlying rhythm of:  NSR rate of 88- so signs of ischemic changes  Medicines ordered and prescription drug management:  I ordered medication including Medications sodium chloride  0.9 % bolus 1,000 mL (has no administration in time range) ondansetron  (ZOFRAN ) injection 4 mg (has no administration in time range) pantoprazole  (PROTONIX ) injection 40 mg (40 mg Intravenous Given 08/24/23 0636) alum & mag hydroxide-simeth (MAALOX/MYLANTA) 200-200-20 MG/5ML suspension 30 mL (30 mLs Oral Given 08/24/23 6045) sucralfate  (CARAFATE ) tablet 1 g (1 g Oral Given 08/24/23 4098) lidocaine  (XYLOCAINE ) 2 % viscous mouth solution 15 mL (15 mLs Mouth/Throat Given 08/24/23 0655) for nausea, epigastric abdominal pain Reevaluation of the patient after these medicines showed that the patient stayed the same I have reviewed the patients home medicines and have made adjustments as needed  Test Considered:       I considered a troponin however given his unchanged and persistent sxs for the last 15 days I have extremely low suspicion for ACS.  Critical Interventions:         Consultations Obtained: Case discussed with [facility] who recommends admission  Problem List / ED Course:        MDM: Patient here with epigastric abdominal pain, known gastritis and esophagitis. No concern for perforated Gastric Ulcer, AAA, ACS.    Dispostion:  After consideration of the diagnostic results and the patients response to treatment, I feel that the patent would benefit from admission. Patient is unable to tolerate food or fluids.    Risk Prescription drug management. Decision regarding hospitalization.           Final Clinical Impression(s) / ED Diagnoses Final diagnoses:  None    Rx / DC Orders ED Discharge Orders     None         Tama Fails, PA-C 08/26/23 0920    Arvilla Birmingham, MD 08/26/23 1020

## 2023-08-24 NOTE — ED Triage Notes (Addendum)
 Pt continues to have the same symptoms as when he was here last time. Pt reports pain not getting better. States that pain is all over belly then radiates up esophagus. Some nausea but denies diarrhea. Pain is worse after eating and he has not been intaking much food.

## 2023-08-24 NOTE — TOC Initial Note (Signed)
 Transition of Care [facility]) - Initial/Assessment Note    Patient Details  Name: Patrick Marshall MRN: [id number] Date of Birth: [birth year]  [facility] CM/SW Contact:    Levie Ream, RN Phone Number: 08/24/2023, 4:43 PM  Clinical Narrative:                 Assisted by Jody Mura, Interpreter # [phone]; spoke w/ pt in room; pt says he lives at home, and he plans to return at d/c; pt identified POC dtr Patrick Marshall [phone]); she will provide transportation; pt verified insurance/PCP; he denied SDOH risks; pt says he does not have DME, HH services, or home oxygen; TOC will follow.  Expected Discharge Plan: Home/Self Care Barriers to Discharge: Continued Medical Work up   Patient Goals and CMS Choice Patient states their goals for this hospitalization and ongoing recovery are:: home CMS Medicare.gov Compare Post Acute Care list provided to:: Patient   [place] ownership interest in [facility].provided to:: Patient    Expected Discharge Plan and Services   Discharge Planning Services: CM Consult   Living arrangements for the past 2 months: Single Family Home                                      Prior Living Arrangements/Services Living arrangements for the past 2 months: Single Family Home Lives with:: Self Patient language and need for interpreter reviewed:: Yes Do you feel safe going back to the place where you live?: Yes      Need for Family Participation in Patient Care: Yes (Comment) Care giver support system in place?: Yes (comment) Current home services:  (n/a) Criminal Activity/Legal Involvement Pertinent to Current Situation/Hospitalization: No - Comment as needed  Activities of Daily Living   ADL Screening (condition at time of admission) Independently performs ADLs?: Yes (appropriate for developmental age) Is the patient deaf or have difficulty hearing?: Yes Does the patient have difficulty seeing, even when  wearing glasses/contacts?: No Does the patient have difficulty concentrating, remembering, or making decisions?: No  Permission Sought/Granted Permission sought to share information with : Case Manager Permission granted to share information with : Yes, Verbal Permission Granted  Share Information with NAME: Case Manager     Permission granted to share info w Relationship: Patrick Marshall (dtr) [phone]     Emotional Assessment Appearance:: Appears stated age Attitude/Demeanor/Rapport: Gracious Affect (typically observed): Accepting Orientation: : Oriented to Self, Oriented to Place, Oriented to  Time, Oriented to Situation Alcohol / Substance Use: Not Applicable Psych Involvement: No (comment)  Admission diagnosis:  Esophageal pain [K22.89] Poor fluid intake [R63.8] Patient Active Problem List   Diagnosis Date Noted   Esophageal pain 08/24/2023   Esophagitis determined by endoscopy 08/24/2023   Combined forms of age-related cataract of left eye 03/14/2018   Morbid obesity (HCC) 02/28/2018   Class 3 severe obesity with body mass index (BMI) of 40.0 to 44.9 in adult 02/28/2018   Lower urinary tract symptoms (LUTS) 02/28/2018   Dyslipidemia 01/28/2018   SOB (shortness of breath) 01/28/2018   Gout 12/12/2017   Prostate cancer screening 12/12/2017   Hyperlipidemia 03/12/2017   Type 2 diabetes mellitus without complication, without long-term current use of insulin (HCC) 09/14/2016   Hypertension 09/14/2016   Pterygium eye, 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   PCP:  Austine Lefort, MD Pharmacy:   [id number] - [place]), [place] - [address] [place] [address] Phone: [phone] Fax: [phone]     Social Drivers of Health (SDOH) Social History: SDOH Screenings   Food Insecurity: No Food Insecurity (08/24/2023)  Housing: Low Risk  (08/24/2023)  Transportation Needs: No Transportation Needs (08/24/2023)  Utilities:  Not At Risk (08/24/2023)  Alcohol Screen: Low Risk  (02/26/2018)  Depression (PHQ2-9): Low Risk  (01/18/2019)  Social Connections: Socially Isolated (08/24/2023)  Tobacco Use: Medium Risk (08/24/2023)   SDOH Interventions: Food Insecurity Interventions: Intervention Not Indicated, Inpatient TOC Housing Interventions: Intervention Not Indicated, Inpatient TOC Transportation Interventions: Intervention Not Indicated, Inpatient TOC Utilities Interventions: Intervention Not Indicated, Inpatient TOC   Readmission Risk Interventions     No data to display

## 2023-08-24 NOTE — H&P (View-Only) (Signed)
 Referring Provider: Tama Fails PA-C Primary Care Physician:  Austine Lefort, MD Primary Gastroenterologist: [facility]  Reason for Consultation: Nausea and epigastric pain  HPI: Patrick Marshall is a 78 y.o. male past medical history tension, hyperlipidemia, palpitations, chronic SOB, diabetes mellitus type 2, gout and GERD.  Past cholecystectomy.  He was recently evaluated in ED 08/09/2023 due to having epigastric pain which radiates through to his back and chest with nausea and no vomiting with decreased oral intake.  Troponin levels were negative and twelve-lead EKG was negative for acute ischemia.  CTAP without acute intra-abdominal/pelvic pathology to explain his symptoms, a moderate amount of stool was noted in the colon.  He received IV Pepcid , Protonix , Carafate  and GI cocktail and his symptoms persisted.  He was discharged home on Pantoprazole  40 mg twice daily and sucralfate  1 g 3 times daily and nightly.  He presented to the ED earlier this morning with persistent nausea and persistent epigastric pain.  Unable to keep liquids or food down for the past few days.  Labs in the ED showed a WBC count of 6.4.  Hemoglobin 6.2.  Hematocrit 48.3.  Platelet 235.  Sodium 137.  Potassium 4.1.  Glucose 139.  BUN 20.  Creatinine 1.33.  Normal LFTs.  Lipase 32.  A GI consult was requested for further evaluation and possible EGD.  Patient is Spanish-speaking therefore I utilized [employer] online Spanish interpreter to facilitate communication at this time.  He endorses having nausea and epigastric pain for the past 2 weeks.  He endorsed taking Pantoprazole  40 mg twice daily for 3 days but stopped it because it was not helping him.  He took Carafate  4 times daily as prescribed for least 7 days.  He takes ASA 81 mg daily, has not taken it for 2 days due to worsening epigastric pain.  No bloody or black stools.  He underwent an EGD by Dr. Savannah Curlin 09/13/2021 secondary to having  epigastric pain, reflux symptoms, some difficulty swallowing water and a globus sensation.  The EGD identified mild esophagitis and mild chronic gastritis.  No evidence of H. pylori.  The esophagus was empirically dilated.  Postprocedure he was advised to take pantoprazole  40 mg twice daily for 8 weeks then to decrease to once daily.    He previously just declined screening colonoscopy.  No known family history of esophageal, gastric or colon cancer.  He was previously evaluated for nonexertional chest pain and shortness of breath, seen by cardiology.  Chest pain thought to be due to GERD.  ECHO 12/26/2022 showed LVEF 55 to 60%.  He denies having any chest pain at this time but describes having intermittent palpitations and sometimes has shortness of breath.  GI PROCEDURES:  EGD 09/13/2021: - Mild esophagitis but no other endoscopic esophageal abnormality to explain patient's dysphagia. Esophagus dilated. Dilated. Biopsied. - Gastritis. Biopsied. - Erythematous duodenopathy. Biopsied. 1. Surgical [P], duodenal FRAGMENTS OF DUODENAL MUCOSA WITH NORMAL VILLOUS AND CRYPT ARCHITECTURE. MILD INFLAMMATION IS SEEN BUT NO INCREASE IN INTRAEPITHELIAL LYMPHOCYTES IS NOTED. NO DYSPLASIA OR MALIGNANCY IS SEEN. NO HISTOLOGIC EVIDENCE OF CELIAC SPRUE IS SEEN. 2. Surgical [P], gastritis MILD CHRONIC GASTRITIS. NO SIGNIFICANT ACTIVITY, DYSPLASIA OR MALIGNANCY IS SEEN ALCIAN BLUE STAIN WITH APPROPRIATE CONTROLS IS NEGATIVE FOR INTESTINAL METAPLASIA. IMMUNOHISTOCHEMISTRY FOR H. PYLORI IS NEGATIVE FOR H PYLORI-LIKE ORGANISMS. 3. Surgical [P], mid esophagus and proximal esophagus FRAGMENTS OF INFLAMED SQUAMOUS AND GLANDULAR MUCOSA WITH RARE CELL SHOWING INTESTINAL METAPLASIA CONFIRMED BY ALCIAN BLUE STAIN WITH APPROPRIATE CONTROLS. NO  SIGNIFICANT NUMBER OF EOSINOPHILS ARE SEEN. NO DYSPLASIA OR MALIGNANCY IS SEEN. CLINICAL AND ENDOSCOPIC CORELATION IS REQUIRED. 4. Surgical [P], distal esophagus FRAGMENTS OF  SQUAMOUS MUCOSA SHOWING MILD INFLAMMATION.. NO SIGNIFICANT NUMBER OF EOSINOPHILS, DYSPLASIA OR MALIGNANCY IS SEEN. CLINICAL AND ENDOSCOPIC CORRELATION IS REQUIRED.    RUQ sono 03/16/2021: 1. 1.6 cm hypoechoic indeterminate right hepatic lesion. Recommend further evaluation with MRI. 2. Heterogeneous liver echotexture may be related to diffuse hepatocellular disease. Please correlate clinically. 3. Cholecystectomy.   Abdominal MRI/ 04/13/2021: 1. 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 This lesion is benign and requires no further workup.    Past Medical History:  Diagnosis Date   Cataract    Diabetes mellitus without complication (HCC)    x few years.   GERD (gastroesophageal reflux disease)    Glaucoma    Gout    Heart disease    HLD (hyperlipidemia)    Hypertension     Past Surgical History:  Procedure Laterality Date   CHOLECYSTECTOMY     EYE SURGERY     2016 both eyes   UPPER GI ENDOSCOPY  2023    Prior to Admission medications   Medication Sig Start Date End Date Taking? Authorizing Provider  allopurinol  (ZYLOPRIM ) 100 MG tablet Take 1.5 tablets (150 mg total) by mouth daily. 04/11/21   Austine Lefort, MD  aspirin  EC 81 MG tablet Take 81 mg by mouth daily. Swallow whole.    [provider]  famotidine  (PEPCID ) 40 MG tablet Take 1 tablet (40 mg total) by mouth at bedtime. 11/07/22   Austine Lefort, MD  ketorolac  (ACULAR ) 0.5 % ophthalmic solution INSTILL 1 DROP INTO LEFT EYE TWICE DAILY 01/16/23   Austine Lefort, MD  latanoprost (XALATAN) 0.005 % ophthalmic solution Place 1 drop into the left eye daily. 05/04/20   [provider]  lisinopril  (ZESTRIL ) 10 MG tablet Take 1 tablet (10 mg total) by mouth daily. 02/27/23   Austine Lefort, MD  metFORMIN  (GLUCOPHAGE ) 500 MG tablet Take 1 tablet (500 mg total) by mouth 2 (two) times daily. 02/27/23   Austine Lefort, MD  nitroGLYCERIN  (NITROSTAT ) 0.4 MG SL tablet DISSOLVE ONE TABLET UNDER THE TONGUE  EVERY 5 MINUTES AS NEEDED FOR CHEST PAIN.  DO NOT EXCEED A TOTAL OF 3 DOSES IN 15 MINUTES 01/02/22   Austine Lefort, MD  pantoprazole  (PROTONIX ) 40 MG tablet Take 1 tablet (40 mg total) by mouth 2 (two) times daily before a meal. 08/09/23   Everlyn Hockey, PA-C  sucralfate  (CARAFATE ) 1 GM/10ML suspension Take 10 mLs (1 g total) by mouth 4 (four) times daily -  with meals and at bedtime. 08/09/23   Kehrli, Kelsey F, PA-C  tamsulosin  (FLOMAX ) 0.4 MG CAPS capsule Take 1 capsule (0.4 mg total) by mouth daily. 04/19/22   Austine Lefort, MD    No current facility-administered medications for this encounter.   Current Outpatient Medications  Medication Sig Dispense Refill   allopurinol  (ZYLOPRIM ) 100 MG tablet Take 1.5 tablets (150 mg total) by mouth daily. 180 tablet 3   aspirin  EC 81 MG tablet Take 81 mg by mouth daily. Swallow whole.     famotidine  (PEPCID ) 40 MG tablet Take 1 tablet (40 mg total) by mouth at bedtime. 90 tablet 3   ketorolac  (ACULAR ) 0.5 % ophthalmic solution INSTILL 1 DROP INTO LEFT EYE TWICE DAILY 5 mL 0   latanoprost (XALATAN) 0.005 % ophthalmic solution Place 1 drop into the left eye daily.  lisinopril  (ZESTRIL ) 10 MG tablet Take 1 tablet (10 mg total) by mouth daily. 30 tablet 0   metFORMIN  (GLUCOPHAGE ) 500 MG tablet Take 1 tablet (500 mg total) by mouth 2 (two) times daily. 60 tablet 0   nitroGLYCERIN  (NITROSTAT ) 0.4 MG SL tablet DISSOLVE ONE TABLET UNDER THE TONGUE EVERY 5 MINUTES AS NEEDED FOR CHEST PAIN.  DO NOT EXCEED A TOTAL OF 3 DOSES IN 15 MINUTES 50 tablet 0   pantoprazole  (PROTONIX ) 40 MG tablet Take 1 tablet (40 mg total) by mouth 2 (two) times daily before a meal. 60 tablet 0   sucralfate  (CARAFATE ) 1 GM/10ML suspension Take 10 mLs (1 g total) by mouth 4 (four) times daily -  with meals and at bedtime. 473 mL 0   tamsulosin  (FLOMAX ) 0.4 MG CAPS capsule Take 1 capsule (0.4 mg total) by mouth daily. 90 capsule 3    Allergies as of 08/24/2023 - Review Complete  08/24/2023  Allergen Reaction Noted   Penicillins Other (See Comments) 01/27/2012    Family History  Problem Relation Age of Onset   Heart disease Mother    Heart failure Father         "Heart Surgery"  died age 71   Esophageal cancer Neg Hx    Colon cancer Neg Hx    Stomach cancer Neg Hx     Social History   Socioeconomic History   Marital status: Married    Spouse name: Not on file   Number of children: 5   Years of education: Not on file   Highest education level: Not on file  Occupational History   Occupation: IT trainer  Tobacco Use   Smoking status: Former    Current packs/day: 0.00    Types: Cigarettes    Start date: 1990    Quit date: 1993    Years since quitting: 32.4   Smokeless tobacco: Never  Vaping Use   Vaping status: Never Used  Substance and Sexual Activity   Alcohol use: Yes    Comment: occasional   Drug use: No   Sexual activity: Not on file  Other Topics Concern   Not on file  Social History Narrative   Not on file   Social Drivers of Health   Financial Resource Strain: Not on file  Food Insecurity: Not on file  Transportation Needs: No Transportation Needs (10/27/2022)   PRAPARE - Administrator, Civil Service (Medical): No    Lack of Transportation (Non-Medical): No  Physical Activity: Not on file  Stress: Not on file  Social Connections: Not on file  Intimate Partner Violence: Not on file   Review of Systems: Gen: Denies fever, sweats or chills. No weight loss.  CV: Denies chest pain, palpitations or edema. Resp: Denies cough, shortness of breath of hemoptysis.  GI: See HPI. GU : Denies urinary burning, blood in urine, increased urinary frequency or incontinence. MS: Denies joint pain, muscles aches or weakness. Derm: Denies rash, itchiness, skin lesions or unhealing ulcers. Psych: Denies depression, anxiety, memory loss or confusion. Heme: Denies easy bruising, bleeding. Neuro:  Denies headaches, dizziness or  paresthesias. Endo:  + DM type II.  Physical Exam: Vital signs in last 24 hours: Temp:  [98 F (36.7 C)] 98 F (36.7 C) (05/30 0557) Pulse Rate:  [65-87] 65 (05/30 0830) Resp:  [16] 16 (05/30 0830) BP: (130-160)/(89-106) 138/92 (05/30 0830) SpO2:  [92 %-99 %] 92 % (05/30 0830)   General: Alert 78 year old male hard of hearing in  no acute distress. Head:  Normocephalic and atraumatic. Eyes:  No scleral icterus. Conjunctiva pink. Ears:  Normal auditory acuity. Nose:  No deformity, discharge or lesions. Mouth:  Dentition intact. No ulcers or lesions.  Neck:  Supple. No lymphadenopathy or thyromegaly.  Lungs: Breath sounds clear throughout. No wheezes, rhonchi or crackles.  Heart: Regular rate and rhythm, no murmurs. Abdomen: Protuberant, mild epigastric tenderness without rebound or guarding.  Positive bowel sounds to all 4 quadrants. Rectal: Deferred. Musculoskeletal:  Symmetrical without gross deformities.  Pulses:  Normal pulses noted. Extremities: No edema. Neurologic:  Alert and  oriented x 4. No focal deficits.  Skin:  Intact without significant lesions or rashes. Psych:  Alert and cooperative. Normal mood and affect.  Intake/Output from previous day: No intake/output data recorded. Intake/Output this shift: No intake/output data recorded.  Lab Results: Recent Labs    08/24/23 0642  WBC 6.4  HGB 16.2  HCT 48.3  PLT 235   BMET Recent Labs    08/24/23 0642  NA 137  K 4.1  CL 107  CO2 21*  GLUCOSE 139*  BUN 20  CREATININE 1.33*  CALCIUM  8.8*   LFT Recent Labs    08/24/23 0642  PROT 6.7  ALBUMIN 4.0  AST 15  ALT 19  ALKPHOS 57  BILITOT 1.2   PT/INR No results for input(s): "LABPROT", "INR" in the last 72 hours. Hepatitis Panel No results for input(s): "HEPBSAG", "HCVAB", "HEPAIGM", "HEPBIGM" in the last 72 hours.    Studies/Results: No results found.  IMPRESSION/PLAN:  78 year old male with nausea and epigastric pain x 2 weeks with  decreased oral intake. On ASA at home, last dose was 2 days ago. CTAP 08/09/2023 did not identify any intra-abdominal/pelvic pathology to explain his symptoms.  EGD 08/2021 showed mild esophagitis and gastritis without evidence of H. pylori. - Clear liquid diet - NPO after midnight - EGD with Dr. Venice Gillis tomorrow benefits and risks discussed including risk with sedation, risk of bleeding, perforation and infection  - Pantoprazole  40 mg IV daily - Await further recommendations per Dr. Venice Gillis  Diabetes mellitus type 2      Tory Freiberg  08/24/2023, 11:12 AM    Attending physician's note   I have taken history, reviewed the chart and examined the patient. I performed a substantive portion of this encounter, including complete performance of at least one of the key components, in conjunction with the APP. I agree with the Advanced Practitioner's note, impression and recommendations.  Spanish-speaking and a poor historian.  Epi pain with N/V. Neg CT AP except for retained stool. Prev EGD 2023 with esophagitis/gastritis with neg HP but + IM. S/P chole in past. Nl CBC,CMP, lipase. Neg prev cardiac workup  Assoc NIDDM, HTN, HLD  Plan: -IV protonix  -EGD in AM. -If neg, further WU as outpt (GES etc) -Declined screening colon previously. -MiraLAX  17 g p.o. twice daily until large bowel movement, then QD   Magnus Schuller, MD [facility] [phone]

## 2023-08-24 NOTE — Anesthesia Preprocedure Evaluation (Addendum)
 Anesthesia Evaluation  Patient identified by MRN, date of birth, ID band Patient awake    Reviewed: Allergy & Precautions, NPO status , Patient's Chart, lab work & pertinent test results  Airway Mallampati: II  TM Distance: >3 FB Neck ROM: Full    Dental no notable dental hx. (+) Teeth Intact, Dental Advisory Given   Pulmonary former smoker   Pulmonary exam normal breath sounds clear to auscultation       Cardiovascular hypertension, (-) Past MI Normal cardiovascular exam Rhythm:Regular Rate:Normal     Neuro/Psych  negative psych ROS   GI/Hepatic ,GERD  ,,  Endo/Other  diabetes, Type 2    Renal/GU      Musculoskeletal   Abdominal  (+) + obese  Peds  Hematology   Anesthesia Other Findings All: PCN  Reproductive/Obstetrics                             Anesthesia Physical Anesthesia Plan  ASA: 3  Anesthesia Plan: MAC   Post-op Pain Management: Minimal or no pain anticipated and Precedex   Induction: Intravenous  PONV Risk Score and Plan: 2 and Treatment may vary due to age or medical condition, Ondansetron  and Propofol infusion  Airway Management Planned: Natural Airway and Nasal Cannula  Additional Equipment: None  Intra-op Plan:   Post-operative Plan:   Informed Consent: I have reviewed the patients History and Physical, chart, labs and discussed the procedure including the risks, benefits and alternatives for the proposed anesthesia with the patient or authorized representative who has indicated his/her understanding and acceptance.     Dental advisory given  Plan Discussed with: CRNA and Surgeon  Anesthesia Plan Comments: (EGD for epigastric pain)       Anesthesia Quick Evaluation

## 2023-08-24 NOTE — Care Management Obs Status (Signed)
 MEDICARE OBSERVATION STATUS NOTIFICATION   Patient Details  Name: Patrick Marshall MRN: [id number] Date of Birth: [birth year]   Medicare Observation Status Notification Given:  Yes    Levie Ream, RN 08/24/2023, 4:19 PM

## 2023-08-25 ENCOUNTER — Observation Stay (HOSPITAL_BASED_OUTPATIENT_CLINIC_OR_DEPARTMENT_OTHER): Payer: Self-pay | Admitting: Anesthesiology

## 2023-08-25 ENCOUNTER — Observation Stay (HOSPITAL_COMMUNITY): Payer: Self-pay | Admitting: Anesthesiology

## 2023-08-25 ENCOUNTER — Encounter (HOSPITAL_COMMUNITY)
Admission: EM | Disposition: A | Discharge status: Home / Self Care | Payer: Self-pay | Source: Home / Self Care | Attending: Emergency Medicine

## 2023-08-25 ENCOUNTER — Encounter (HOSPITAL_COMMUNITY): Payer: Self-pay | Admitting: Internal Medicine

## 2023-08-25 DIAGNOSIS — R1013 Epigastric pain: Secondary | ICD-10-CM | POA: Diagnosis not present

## 2023-08-25 DIAGNOSIS — K2970 Gastritis, unspecified, without bleeding: Secondary | ICD-10-CM

## 2023-08-25 DIAGNOSIS — K2960 Other gastritis without bleeding: Secondary | ICD-10-CM | POA: Diagnosis not present

## 2023-08-25 DIAGNOSIS — Q399 Congenital malformation of esophagus, unspecified: Secondary | ICD-10-CM | POA: Diagnosis not present

## 2023-08-25 DIAGNOSIS — K2289 Other specified disease of esophagus: Secondary | ICD-10-CM | POA: Diagnosis not present

## 2023-08-25 DIAGNOSIS — E119 Type 2 diabetes mellitus without complications: Secondary | ICD-10-CM | POA: Diagnosis not present

## 2023-08-25 DIAGNOSIS — I10 Essential (primary) hypertension: Secondary | ICD-10-CM | POA: Diagnosis not present

## 2023-08-25 HISTORY — PX: ESOPHAGOGASTRODUODENOSCOPY: SHX5428

## 2023-08-25 LAB — BASIC METABOLIC PANEL WITH GFR
Anion gap: 7 (ref 5–15)
BUN: 15 mg/dL (ref 8–23)
CO2: 24 mmol/L (ref 22–32)
Calcium: 8.6 mg/dL — ABNORMAL LOW (ref 8.9–10.3)
Chloride: 104 mmol/L (ref 98–111)
Creatinine, Ser: 1.18 mg/dL (ref 0.61–1.24)
GFR, Estimated: >60 mL/min (ref >60)
Glucose, Bld: 105 mg/dL — ABNORMAL HIGH (ref 70–99)
Potassium: 4.1 mmol/L (ref 3.5–5.1)
Sodium: 135 mmol/L (ref 135–145)

## 2023-08-25 LAB — CBC
HCT: 50 % (ref 39.0–52.0)
Hemoglobin: 16.3 g/dL (ref 13.0–17.0)
MCH: 32.7 pg (ref 26.0–34.0)
MCHC: 32.6 g/dL (ref 30.0–36.0)
MCV: 100.4 fL — ABNORMAL HIGH (ref 80.0–100.0)
Platelets: 230 10*3/uL (ref 150–400)
RBC: 4.98 MIL/uL (ref 4.22–5.81)
RDW: 13.2 % (ref 11.5–15.5)
WBC: 6.1 10*3/uL (ref 4.0–10.5)
nRBC: 0 % (ref 0.0–0.2)

## 2023-08-25 LAB — HEMOGLOBIN A1C
Hgb A1c MFr Bld: 7.5 % — ABNORMAL HIGH (ref 4.8–5.6)
Mean Plasma Glucose: 168.55 mg/dL

## 2023-08-25 LAB — GLUCOSE, CAPILLARY: Glucose-Capillary: 116 mg/dL — ABNORMAL HIGH (ref 70–99)

## 2023-08-25 SURGERY — EGD (ESOPHAGOGASTRODUODENOSCOPY)
Anesthesia: Monitor Anesthesia Care

## 2023-08-25 MED ORDER — PANTOPRAZOLE SODIUM 40 MG PO TBEC: 40.0000 mg | DELAYED_RELEASE_TABLET | Freq: Two times a day (BID) | ORAL | 0 refills | Status: DC

## 2023-08-25 MED ORDER — EPHEDRINE SULFATE-NACL 50-0.9 MG/10ML-% IV SOSY
PREFILLED_SYRINGE | INTRAVENOUS | Status: DC | PRN
Start: 2023-08-25 — End: 2023-08-25
  Administered 2023-08-25: 10 mg via INTRAVENOUS
  Administered 2023-08-25: 5 mg via INTRAVENOUS

## 2023-08-25 MED ORDER — PROPOFOL 10 MG/ML IV BOLUS
INTRAVENOUS | Status: DC | PRN
  Administered 2023-08-25 (×2): 50 mg via INTRAVENOUS
  Administered 2023-08-25: 20 mg via INTRAVENOUS
  Administered 2023-08-25: 50 mg via INTRAVENOUS
  Administered 2023-08-25: 30 mg via INTRAVENOUS
  Administered 2023-08-25: 50 mg via INTRAVENOUS

## 2023-08-25 MED ORDER — LIDOCAINE 2% (20 MG/ML) 5 ML SYRINGE
INTRAMUSCULAR | Status: DC | PRN
  Administered 2023-08-25: 100 mg via INTRAVENOUS

## 2023-08-25 MED ORDER — PROPOFOL 500 MG/50ML IV EMUL
INTRAVENOUS | Status: AC
  Filled 2023-08-25: qty 50

## 2023-08-25 MED ORDER — LIDOCAINE VISCOUS HCL 2 % MT SOLN: 15.0000 mL | Freq: Three times a day (TID) | OROMUCOSAL | 2 refills | Status: DC | PRN

## 2023-08-25 NOTE — Op Note (Addendum)
 [facility] Patient Name: Patrick Marshall Procedure Date: 08/25/2023 MRN: [id number] Attending MD: Lajuan Pila , MD, [id number] Date of Birth: December 09, 1945 CSN: [id number] Age: 78 Admit Type: Inpatient Procedure:                Upper GI endoscopy Indications:              Epigastric abdominal pain with neg CT AP. S/P lap                            chole in past Providers:                Lajuan Pila, MD, Lorenzo Romberg, RN, Tyrus Gallus, Technician Referring MD:              Medicines:                Monitored Anesthesia Care Complications:            No immediate complications. Estimated Blood Loss:     Estimated blood loss: none. Procedure:                Pre-Anesthesia Assessment:                           - Prior to the procedure, a History and Physical                            was performed, and patient medications and                            allergies were reviewed. The patient's tolerance of                            previous anesthesia was also reviewed. The risks                            and benefits of the procedure and the sedation                            options and risks were discussed with the patient.                            All questions were answered, and informed consent                            was obtained. Prior Anticoagulants: The patient has                            taken no anticoagulant or antiplatelet agents. ASA                            Grade Assessment: II - A patient with mild systemic  disease. After reviewing the risks and benefits,                            the patient was deemed in satisfactory condition to                            undergo the procedure.                           After obtaining informed consent, the endoscope was                            passed under direct vision. Throughout the                            procedure, the patient's blood  pressure, pulse, and                            oxygen saturations were monitored continuously. The                            [id number]-[id number] ([id number]) Olympus endoscope was introduced                            through the mouth, and advanced to the second part                            of duodenum. The upper GI endoscopy was                            accomplished without difficulty. The patient                            tolerated the procedure well. Scope In: Scope Out: Findings:      The lower third of the esophagus was mildly tortuous. Biopsies were       obtained from the proximal and distal esophagus with cold forceps for       histology of suspected eosinophilic esophagitis.      The Z-line was regular and was found 38 cm from the incisors. Slight       catch as we entered the GE junction. Doubt achalasia since GE junction       did distend well.      Diffuse mild inflammation characterized by congestion (edema) and       erythema was found in the entire examined stomach. Biopsies were taken       with a cold forceps for histology from antrum, body and fundus.      The examined duodenum was normal. Biopsies for histology were taken with       a cold forceps for evaluation of celiac disease. Impression:               - Mild Gastritis. Biopsied.                           - Otherwise normal EGD. Moderate Sedation:      Not  Applicable - Patient had care per Anesthesia. Recommendation:           - Patient has a contact number available for                            emergencies. The signs and symptoms of potential                            delayed complications were discussed with the                            patient. Return to normal activities tomorrow.                            Written discharge instructions were provided to the                            patient.                           - Resume previous diet.                           - Continue present medications including  Protonix                             40 mg p.o. twice daily.                           - Can continue Carafate  x 2 weeks.                           - If still with problems, can try GI cocktail.15 cc                            po QID prn                           - Await pathology results.                           - FU GI clinic in 6-8 weeks. If still with                            problems, barium swallow followed by GES/manometry.                           - GI will sign off for now                           - The findings and recommendations were discussed                            with the patient. Procedure Code(s):        --- Professional ---  65784, Esophagogastroduodenoscopy, flexible,                            transoral; with biopsy, single or multiple Diagnosis Code(s):        --- Professional ---                           Q39.9, Congenital malformation of esophagus,                            unspecified                           K29.70, Gastritis, unspecified, without bleeding                           R10.13, Epigastric pain CPT copyright 2022 American Medical Association. All rights reserved. The codes documented in this report are preliminary and upon coder review may  be revised to meet current compliance requirements. Lajuan Pila, MD 08/25/2023 7:55:25 AM This report has been signed electronically. Number of Addenda: 0

## 2023-08-25 NOTE — Interval H&P Note (Signed)
 History and Physical Interval Note:  08/25/2023 7:30 AM  Patrick Marshall  has presented today for surgery, with the diagnosis of Epigastric pain.  The various methods of treatment have been discussed with the patient and family. After consideration of risks, benefits and other options for treatment, the patient has consented to  Procedure(s): EGD (ESOPHAGOGASTRODUODENOSCOPY) (N/A) as a surgical intervention.  The patient's history has been reviewed, patient examined, no change in status, stable for surgery.  I have reviewed the patient's chart and labs.  Questions were answered to the patient's satisfaction.     Lajuan Pila

## 2023-08-25 NOTE — Progress Notes (Signed)
 Pt stable and a/ox4, confirmed he was driving himself. Denies pain or dizziness. Tolerated meal. Refused CGM. Pt ready for discharge. AVS printed in spanish and english. Reviewed AVS, medications, and education material with patient. Patient verbalized understanding with teach back. Patient stated he would give AVS material to his daughter to review at home. Patient teaching completed about diet and medications. IV removed. All questions asked and answered. Pt off unit in wheelchair.

## 2023-08-25 NOTE — Discharge Summary (Signed)
 Physician Discharge Summary  Juancarlos Crescenzo ZOX:[id number] DOB: 1945/10/07 DOA: 08/24/2023  PCP: Austine Lefort, MD  Admit date: 08/24/2023 Discharge date: 08/25/2023  Admitted From: Home Disposition: Home  Recommendations for Outpatient Follow-up:  Follow up with PCP in 1-2 weeks GI office to schedule follow-up and also call with biopsy results  Home Health: N/A Equipment/Devices: N/A  Discharge Condition: Stable CODE STATUS: Full code Diet recommendation: Low-salt and low-carb diet, GERD precautions  Discharge summary: 78 year old gentleman with history of hypertension hyperlipidemia and type 2 diabetes on metformin  who is currently not taking any medications, he is recently suffering from epigastric pain and dyspepsia for at least few months now.  Previous upper GI endoscopy with esophagitis and gastritis.  Recently visited emergency room, CT scan abdomen pelvis without any acute issues.  Admitted due to significant symptoms.  Underwent upper GI endoscopy and found to have mild esophagitis and antral gastritis biopsies taken.  Did not have any evidence of bleeding ulcers or abnormalities.  Dyspepsia/gastritis esophagitis with epigastric pain: Recent CT scan with no structural findings. Upper GI endoscopy with mild inflammatory changes. Inconsistently taking Protonix .  Carafate  did not help as per patient. Plan: Protonix  40 mg twice daily uninterrupted, prescription sent Gastric cocktail 15 mL up to 4 times daily as needed. Biopsies pending, GI office to follow-up.  Hypertension, type 2 diabetes: Patient is not consistently taking medications.  A1c pending.  As he is not taking medications, will not prescribe any until he keeps ongoing follow-up at primary care physician's office.  Stable for discharge.   Discharge Diagnoses:  Principal Problem:   Esophageal pain Active Problems:   Type 2 diabetes mellitus without complication, without long-term current use of  insulin  (HCC)   Hypertension   Hyperlipidemia   Esophagitis determined by endoscopy    Discharge Instructions  Discharge Instructions     Diet - low sodium heart healthy   Complete by: As directed    Increase activity slowly   Complete by: As directed       Allergies as of 08/25/2023       Reactions   Penicillins Other (See Comments)   Difficulty sleeping        Medication List     STOP taking these medications    aspirin  EC 81 MG tablet   lisinopril  10 MG tablet Commonly known as: ZESTRIL    metFORMIN  500 MG tablet Commonly known as: GLUCOPHAGE    sucralfate  1 GM/10ML suspension Commonly known as: Carafate        TAKE these medications    GI Cocktail (alum & mag hydroxide-simethicone /lidocaine )oral mixture Take 15 mLs by mouth 3 (three) times daily as needed (for heart burn).   ketorolac  0.5 % ophthalmic solution Commonly known as: ACULAR  INSTILL 1 DROP INTO LEFT EYE TWICE DAILY   nitroGLYCERIN  0.4 MG SL tablet Commonly known as: NITROSTAT  DISSOLVE ONE TABLET UNDER THE TONGUE EVERY 5 MINUTES AS NEEDED FOR CHEST PAIN.  DO NOT EXCEED A TOTAL OF 3 DOSES IN 15 MINUTES What changed: See the new instructions.   pantoprazole  40 MG tablet Commonly known as: PROTONIX  Take 1 tablet (40 mg total) by mouth 2 (two) times daily before a meal.        Allergies  Allergen Reactions   Penicillins Other (See Comments)    Difficulty sleeping    Consultations: Gastroenterology   Procedures/Studies: CT ABDOMEN PELVIS W CONTRAST Result Date: 08/09/2023 CLINICAL DATA:  Epigastric pain mid sternal abdominal and chest pain EXAM: CT ABDOMEN AND PELVIS WITH  CONTRAST TECHNIQUE: Multidetector CT imaging of the abdomen and pelvis was performed using the standard protocol following bolus administration of intravenous contrast. RADIATION DOSE REDUCTION: This exam was performed according to the departmental dose-optimization program which includes automated exposure  control, adjustment of the mA and/or kV according to patient size and/or use of iterative reconstruction technique. CONTRAST:  OMNIPAQUE  IOHEXOL  300 MG/ML  SOLN COMPARISON:  CT abdomen and pelvis January 27, 2012 FINDINGS: Lower chest: No infiltrates or consolidations, no pleural effusions Hepatobiliary: Prior cholecystectomy. Liver unremarkable without parenchymal lesions or biliary dilatation. Pancreas: Pancreas normal size. No masses calcifications or inflammatory changes. Spleen: Spleen normal size.  No masses. Adrenals/Urinary Tract: Adrenal glands are normal size. Follow-up recommended. Kidneys are normal. No masses calcifications or hydronephrosis Stomach/Bowel: No small or large bowel obstruction or inflammatory changes. Moderate amount of residual fecal material throughout the colon without obstruction or constipation. Vascular/Lymphatic: No significant vascular findings are present. No enlarged abdominal or pelvic lymph nodes. Reproductive: Enlarged prostatic gland correlate clinically. Other: Anterior abdominal wall unremarkable without evidence of umbilical or inguinal hernias Musculoskeletal: Visualized portion of the thoracolumbar spine and pelvic structures grossly unremarkable without evidence of fracture bony abnormalities or soft tissue masses. IMPRESSION: *No acute findings in the abdomen or pelvis. *Moderate amount of residual fecal material throughout the colon without obstruction or constipation. *Enlarged prostatic gland correlate clinically. Electronically Signed   By: Fredrich Jefferson M.D.   On: 08/09/2023 08:10   DG Chest 2 View Result Date: 08/09/2023 CLINICAL DATA:  78 year old male with midsternal chest and abdominal pain. Burning pain and difficulty breathing. EXAM: CHEST - 2 VIEW COMPARISON:  Portable chest 10/18/2022 and earlier. FINDINGS: AP and lateral views of the chest 0647 hours. Lower lung volumes on all three views. Allowing for this cardiac and mediastinal contours are  stable. No definite cardiomegaly. Visualized tracheal air column is within normal limits. No pneumothorax or pulmonary edema. Crowding of lung markings with no consolidation or effusion. Negative visible bowel gas. Stable cholecystectomy clips. No evidence of pneumoperitoneum. No acute osseous abnormality identified. IMPRESSION: Low lung volumes with no acute cardiopulmonary abnormality. Chronic cholecystectomy with normal visible bowel gas. Electronically Signed   By: Marlise Simpers M.D.   On: 08/09/2023 06:53   (Echo, Carotid, EGD, Colonoscopy, ERCP)    Subjective: Patient seen and examined.  Came back from procedure.  Daughter on the speaker phone and patient requested her to translate for him.  Patient tells me that he is kind of frustrated due to ongoing discomfort in his epigastrium, worse with the eating.  Discussed in detail about endoscopic findings, also updated patient's daughter. Discussed medications, dietary changes and instructions.   Discharge Exam: Vitals:   08/25/23 0800 08/25/23 0815  BP: 127/80 133/80  Pulse:  64  Resp: (!) 23 (!) 21  Temp:  97.9 F (36.6 C)  SpO2:  96%   Vitals:   08/25/23 0714 08/25/23 0755 08/25/23 0800 08/25/23 0815  BP: 136/84 120/80 127/80 133/80  Pulse: 63 74  64  Resp: 16 13 (!) 23 (!) 21  Temp: 97.8 F (36.6 C) (!) 97.3 F (36.3 C)  97.9 F (36.6 C)  TempSrc: Temporal     SpO2: 94% 100%  96%    General: Pt is alert, awake, not in acute distress.  Hard of hearing. Cardiovascular: RRR, S1/S2 +, no rubs, no gallops Respiratory: CTA bilaterally, no wheezing, no rhonchi Abdominal: Soft, NT, ND, bowel sounds +, no localized tenderness.  No rigidity.  No guarding. Extremities:  no edema, no cyanosis    The results of significant diagnostics from this hospitalization (including imaging, microbiology, ancillary and laboratory) are listed below for reference.     Microbiology: No results found for this or any previous visit (from the past 240  hours).   Labs: BNP (last 3 results) No results for input(s): "BNP" in the last 8760 hours. Basic Metabolic Panel: Recent Labs  Lab 08/24/23 0642 08/24/23 1229 08/25/23 0523  NA 137  --  135  K 4.1  --  4.1  CL 107  --  104  CO2 21*  --  24  GLUCOSE 139*  --  105*  BUN 20  --  15  CREATININE 1.33* 1.33* 1.18  CALCIUM  8.8*  --  8.6*   Liver Function Tests: Recent Labs  Lab 08/24/23 0642  AST 15  ALT 19  ALKPHOS 57  BILITOT 1.2  PROT 6.7  ALBUMIN 4.0   Recent Labs  Lab 08/24/23 0642  LIPASE 32   No results for input(s): "AMMONIA" in the last 168 hours. CBC: Recent Labs  Lab 08/24/23 0642 08/24/23 1229 08/25/23 0523  WBC 6.4 5.9 6.1  NEUTROABS 4.2  --   --   HGB 16.2 15.3 16.3  HCT 48.3 46.1 50.0  MCV 96.2 98.1 100.4*  PLT 235 216 230   Cardiac Enzymes: No results for input(s): "CKTOTAL", "CKMB", "CKMBINDEX", "TROPONINI" in the last 168 hours. BNP: Invalid input(s): "POCBNP" CBG: Recent Labs  Lab 08/24/23 1240 08/24/23 1627 08/24/23 2041 08/25/23 0759  GLUCAP 123* 171* 125* 116*   D-Dimer No results for input(s): "DDIMER" in the last 72 hours. Hgb A1c No results for input(s): "HGBA1C" in the last 72 hours. Lipid Profile No results for input(s): "CHOL", "HDL", "LDLCALC", "TRIG", "CHOLHDL", "LDLDIRECT" in the last 72 hours. Thyroid  function studies No results for input(s): "TSH", "T4TOTAL", "T3FREE", "THYROIDAB" in the last 72 hours.  Invalid input(s): "FREET3" Anemia work up No results for input(s): "VITAMINB12", "FOLATE", "FERRITIN", "TIBC", "IRON", "RETICCTPCT" in the last 72 hours. Urinalysis    Component Value Date/Time   COLORURINE YELLOW 08/24/2023 0748   APPEARANCEUR CLEAR 08/24/2023 0748   LABSPEC 1.021 08/24/2023 0748   PHURINE 5.0 08/24/2023 0748   GLUCOSEU NEGATIVE 08/24/2023 0748   HGBUR NEGATIVE 08/24/2023 0748   BILIRUBINUR NEGATIVE 08/24/2023 0748   KETONESUR 20 (A) 08/24/2023 0748   PROTEINUR NEGATIVE 08/24/2023 0748    UROBILINOGEN 1.0 01/27/2012 1317   NITRITE NEGATIVE 08/24/2023 0748   LEUKOCYTESUR NEGATIVE 08/24/2023 0748   Sepsis Labs Recent Labs  Lab 08/24/23 0642 08/24/23 1229 08/25/23 0523  WBC 6.4 5.9 6.1   Microbiology No results found for this or any previous visit (from the past 240 hours).   Time coordinating discharge: 32 minutes  SIGNED:   Vada Garibaldi, MD  [facility] 08/25/2023, 9:12 AM

## 2023-08-25 NOTE — Plan of Care (Signed)

## 2023-08-25 NOTE — Transfer of Care (Signed)
 Immediate Anesthesia Transfer of Care Note  Patient: Patrick Marshall  Procedure(s) Performed: EGD (ESOPHAGOGASTRODUODENOSCOPY)  Patient Location: PACU  Anesthesia Type:MAC  Level of Consciousness: drowsy and patient cooperative  Airway & Oxygen Therapy: Patient Spontanous Breathing and Patient connected to face mask oxygen  Post-op Assessment: Report given to RN and Post -op Vital signs reviewed and stable  Post vital signs: Reviewed and stable  Last Vitals:  Vitals Value Taken Time  BP 120/80 08/25/23 0755  Temp 36.3 C 08/25/23 0755  Pulse 71 08/25/23 0757  Resp 13 08/25/23 0757  SpO2 100 % 08/25/23 0757  Vitals shown include unfiled device data.  Last Pain:  Vitals:   08/25/23 0755  TempSrc:   PainSc: Asleep         Complications: No notable events documented.

## 2023-08-25 NOTE — Progress Notes (Signed)
Patient off unit in procedure.

## 2023-08-25 NOTE — Anesthesia Procedure Notes (Signed)
 Procedure Name: MAC Date/Time: 08/25/2023 7:35 AM  Performed by: Melodee Spruce, CRNAPre-anesthesia Checklist: Patient identified, Emergency Drugs available, Suction available and Patient being monitored Patient Re-evaluated:Patient Re-evaluated prior to induction Oxygen Delivery Method: Simple face mask Preoxygenation: Pre-oxygenation with 100% oxygen Induction Type: IV induction Airway Equipment and Method: Bite block Placement Confirmation: positive ETCO2 and breath sounds checked- equal and bilateral Dental Injury: Teeth and Oropharynx as per pre-operative assessment

## 2023-08-25 NOTE — Anesthesia Postprocedure Evaluation (Signed)
 Anesthesia Post Note  Patient: Daine Sesar Riche  Procedure(s) Performed: EGD (ESOPHAGOGASTRODUODENOSCOPY)     Patient location during evaluation: Endoscopy Anesthesia Type: MAC Level of consciousness: awake and alert Pain management: pain level controlled Vital Signs Assessment: post-procedure vital signs reviewed and stable Respiratory status: spontaneous breathing, nonlabored ventilation, respiratory function stable and patient connected to nasal cannula oxygen Cardiovascular status: blood pressure returned to baseline and stable Postop Assessment: no apparent nausea or vomiting Anesthetic complications: no  No notable events documented.  Last Vitals:  Vitals:   08/25/23 0800 08/25/23 0815  BP: 127/80 133/80  Pulse:  64  Resp: (!) 23 (!) 21  Temp:  36.6 C  SpO2:  96%    Last Pain:  Vitals:   08/25/23 1009  TempSrc:   PainSc: 6                  Rosalita Combe

## 2023-08-27 ENCOUNTER — Encounter: Payer: Self-pay | Admitting: Family Medicine

## 2023-08-27 ENCOUNTER — Ambulatory Visit (INDEPENDENT_AMBULATORY_CARE_PROVIDER_SITE_OTHER): Admitting: Family Medicine

## 2023-08-27 VITALS — BP 122/72 | HR 91 | Temp 98.3°F | Ht 65.0 in | Wt 211.0 lb

## 2023-08-27 DIAGNOSIS — K2210 Ulcer of esophagus without bleeding: Secondary | ICD-10-CM | POA: Diagnosis not present

## 2023-08-27 MED ORDER — LIDOCAINE VISCOUS HCL 2 % MT SOLN: 15.0000 mL | Freq: Three times a day (TID) | OROMUCOSAL | 2 refills | Status: DC | PRN

## 2023-08-27 MED ORDER — DEXLANSOPRAZOLE 60 MG PO CPDR: 60.0000 mg | DELAYED_RELEASE_CAPSULE | Freq: Every day | ORAL | 3 refills | Status: DC

## 2023-08-27 NOTE — Progress Notes (Signed)
 Subjective:    Patient ID: Patrick Marshall, male    DOB: 10-19-1945, 78 y.o.   MRN: [id number] 08/14/22 Patient has been dealing with chronic abdominal pain for quite some time.  Please see previous office notes.  Last EGD in [place]  was 6/23 which showed gastritis. He was lost to follow-up as he went back to [place]  While in [place] he had endoscopy due to his chronic abdominal pain and he was diagnosed with a hiatal hernia per his report.  He was also told that he had an ulcer in the antrum of the stomach and in the duodenal bulb along with chronic gastritis and erosive esophagitis.  He was placed on Nexium  twice daily as well as cinitaprida which is available only in [place].  He is here today requesting refills  11/03/22 Patient recently went to the emergency room and [facility].  He states for 3 weeks he has been feeling bad.  He reports pain underneath his xiphoid process.  The pain radiated up into his chest.  It hurts to lay down.  He reported nausea.  He reported a burning pain radiating up his throat.  He has stopped all of his medication.  The only medicine he was taking his aspirin .  Per his report, they did a CT scan in the emergency room that was negative.  They started him on omeprazole  40 mg daily.  At that time, my plan was: I believe the patient has chronic gastritis and his symptoms likely return because he stopped his medication.  Therefore I recommended that he continue omeprazole  40 mg daily for 3 months that he add Pepcid  40 mg daily and use Zofran  4 mg every 8 hours as needed for nausea.  Recheck if no better in 2 weeks or sooner if worse  11/07/22 Patient stopped taking his omeprazole  completely and was only taking famotidine .  He presents today with a burning pain in his epigastric area radiating up to his throat EKG shows normal sinus rhythm with normal intervals with no.  He denies any trouble breathing.  Patient rated his pain at a 10.  I gave him a GI  cocktail including viscous lidocaine  and his pain improved to a 3 within 5 minutes.  He denies any angina or shortness of breath.  He states that he has a difficult time eating.  It hurts to even drink water.  08/27/23 Since I last saw the patient, he apparently stopped all of his medications.  He states that the pantoprazole  medicine never helped him.  That is why he stopped it.  He was admitted to the hospital and had an EGD performed due to epigastric pain and esophageal pain.  EGD showed mild esophagitis.  Biopsies are pending.  He was started on pantoprazole  twice a day and then a GI cocktail 15 mL 3 times a day as needed.  He has not been taking the pantoprazole  since discharge from hospital because he states the medicine does not help.  He has been taking the GI cocktail.  Patient has also tried and failed omeprazole  and famotidine  Past Medical History:  Diagnosis Date   Cataract    Diabetes mellitus without complication (HCC)    x few years.   GERD (gastroesophageal reflux disease)    Glaucoma    Gout    Heart disease    HLD (hyperlipidemia)    Hypertension    Past Surgical History:  Procedure Laterality Date   CHOLECYSTECTOMY  EYE SURGERY     2016 both eyes   UPPER GI ENDOSCOPY  2023   Current Outpatient Medications on File Prior to Visit  Medication Sig Dispense Refill   magic mouthwash (lidocaine , diphenhydrAMINE, alum & mag hydroxide) suspension Swish and swallow 15 mLs 3 (three) times daily.     ketorolac  (ACULAR ) 0.5 % ophthalmic solution INSTILL 1 DROP INTO LEFT EYE TWICE DAILY 5 mL 0   nitroGLYCERIN  (NITROSTAT ) 0.4 MG SL tablet DISSOLVE ONE TABLET UNDER THE TONGUE EVERY 5 MINUTES AS NEEDED FOR CHEST PAIN.  DO NOT EXCEED A TOTAL OF 3 DOSES IN 15 MINUTES (Patient taking differently: Place 0.4 mg under the tongue every 5 (five) minutes as needed for chest pain.) 50 tablet 0   pantoprazole  (PROTONIX ) 40 MG tablet Take 1 tablet (40 mg total) by mouth 2 (two) times daily before  a meal. 60 tablet 0   No current facility-administered medications on file prior to visit.   Allergies  Allergen Reactions   Penicillins Other (See Comments)    Difficulty sleeping   Social History   Socioeconomic History   Marital status: Married    Spouse name: Not on file   Number of children: 5   Years of education: Not on file   Highest education level: Not on file  Occupational History   Occupation: IT trainer  Tobacco Use   Smoking status: Former    Current packs/day: 0.00    Types: Cigarettes    Start date: 1990    Quit date: 1993    Years since quitting: 32.4   Smokeless tobacco: Never  Vaping Use   Vaping status: Never Used  Substance and Sexual Activity   Alcohol use: Yes    Comment: occasional   Drug use: No   Sexual activity: Not on file  Other Topics Concern   Not on file  Social History Narrative   Not on file   Social Drivers of Health   Financial Resource Strain: Not on file  Food Insecurity: No Food Insecurity (08/24/2023)   Hunger Vital Sign    Worried About Running Out of Food in the Last Year: Never true    Ran Out of Food in the Last Year: Never true  Transportation Needs: No Transportation Needs (08/24/2023)   PRAPARE - Administrator, Civil Service (Medical): No    Lack of Transportation (Non-Medical): No  Physical Activity: Not on file  Stress: Not on file  Social Connections: Socially Isolated (08/24/2023)   Social Connection and Isolation Panel [NHANES]    Frequency of Communication with Friends and Family: More than three times a week    Frequency of Social Gatherings with Friends and Family: Twice a week    Attends Religious Services: Never    Database administrator or Organizations: No    Attends Banker Meetings: Never    Marital Status: Separated  Intimate Partner Violence: Not At Risk (08/24/2023)   Humiliation, Afraid, Rape, and Kick questionnaire    Fear of Current or Ex-Partner: No    Emotionally  Abused: No    Physically Abused: No    Sexually Abused: No      Review of Systems  Gastrointestinal:  Positive for abdominal pain.  All other systems reviewed and are negative.      Objective:   Physical Exam Vitals reviewed.  Constitutional:      General: He is not in acute distress.    Appearance: Normal appearance. He is obese.  He is not ill-appearing or toxic-appearing.  HENT:     Right Ear: Hearing, tympanic membrane and ear canal normal. There is no impacted cerumen.     Left Ear: Tympanic membrane and ear canal normal. Decreased hearing noted. There is no impacted cerumen.  Neck:     Vascular: No carotid bruit.  Cardiovascular:     Rate and Rhythm: Normal rate and regular rhythm.     Pulses: Normal pulses.     Heart sounds: Normal heart sounds. No murmur heard.    No friction rub. No gallop.  Pulmonary:     Effort: Pulmonary effort is normal. No respiratory distress.     Breath sounds: Normal breath sounds. No stridor. No wheezing, rhonchi or rales.  Chest:     Chest wall: No tenderness.  Abdominal:     General: Abdomen is flat. Bowel sounds are normal. There is no distension.     Palpations: Abdomen is soft.     Tenderness: There is no abdominal tenderness. There is no guarding.  Musculoskeletal:     Right lower leg: No edema.     Left lower leg: No edema.  Neurological:     Mental Status: He is alert.           Assessment & Plan:  Erosive esophagitis I believe the patient is not consistently taking his medications long enough to allow it to work.  I emphasized the importance.  Patient does not believe pantoprazole  is beneficial so I will switch him to Dexilant 60 mg a day.  Consider Voquenza if not working.  Recheck in 2 weeks.  Refilled his GI cocktail.

## 2023-08-28 ENCOUNTER — Telehealth: Payer: Self-pay | Admitting: Pharmacy Technician

## 2023-08-28 ENCOUNTER — Telehealth: Payer: Self-pay

## 2023-08-28 ENCOUNTER — Other Ambulatory Visit: Payer: Self-pay

## 2023-08-28 ENCOUNTER — Encounter (HOSPITAL_COMMUNITY): Payer: Self-pay | Admitting: Gastroenterology

## 2023-08-28 ENCOUNTER — Other Ambulatory Visit (HOSPITAL_COMMUNITY): Payer: Self-pay

## 2023-08-28 DIAGNOSIS — K2210 Ulcer of esophagus without bleeding: Secondary | ICD-10-CM

## 2023-08-28 LAB — SURGICAL PATHOLOGY

## 2023-08-28 MED ORDER — LIDOCAINE VISCOUS HCL 2 % MT SOLN: 15.0000 mL | Freq: Three times a day (TID) | OROMUCOSAL | 1 refills | Status: DC

## 2023-08-28 NOTE — Telephone Encounter (Signed)
 Copied from CRM [id number]. Topic: Clinical - Prescription Issue >> Aug 28, 2023  3:10 PM Ethelle Herb L wrote: Reason for CRM: Daughter requesting rx for magic mouthwash (lidocaine , diphenhydrAMINE, alum & mag hydroxide) suspension  to be sent to [facility]. The [facility] is not able to fill this prescription and informed daughter [facility] is able to do so.   Please assist pt further

## 2023-08-28 NOTE — Telephone Encounter (Signed)
 Pharmacy Patient Advocate Encounter   Received notification from Onbase that prior authorization for Dexlansoprazole 60MG  dr capsules is required/requested.   Insurance verification completed.   The patient is insured through [facility] .   Per test claim: PA required; PA submitted to above mentioned insurance via CoverMyMeds Key/confirmation #[id number] Status is pending

## 2023-08-28 NOTE — Telephone Encounter (Signed)
 Copied from CRM [id number]. Topic: Clinical - Medication Prior Auth >> Aug 28, 2023  2:51 PM Donald Frost wrote: Reason for CRM: Patrick Marshall with [facility] Medicare called in stating the prior authorization for the dexlansoprazole (DEXILANT) 60 MG capsule has been denied and she is faxing over the denial and an appeal document to the provider. Please assist patient further

## 2023-08-28 NOTE — Telephone Encounter (Signed)
 Pharmacy Patient Advocate Encounter  Received notification from [facility] that Prior Authorization for Dexlansoprazole 60MG  dr capsules has been DENIED.  Full denial letter will be uploaded to the media tab. See denial reason below.     PA #/Case ID/Reference #: [id number]

## 2023-08-30 ENCOUNTER — Other Ambulatory Visit (HOSPITAL_COMMUNITY): Payer: Self-pay

## 2023-09-11 NOTE — Progress Notes (Signed)
 09/12/2023 Patrick Marshall [id number] 07-Jun-1945  Referring provider: Austine Lefort, MD Primary GI doctor: Dr. Venice Gillis  ASSESSMENT AND PLAN:  Nausea and epigastric pain, decreased appetite, weight loss CTAP with contrast 08/09/2023 for epigastric pain shows moderate amount of residual fecal matter throughout the colon no obstruction, enlarged prostate, otherwise unremarkable.  No bowel inflammation, liver unremarkable pancreas normal spleen normal prior cholecystectomy EGD 08/25/2023 mild gastritis otherwise unremarkable EGD, negative celiac negative H. pylori no metaplasia/dysplasia reflux esophagitis negative EOE or dysplasia Some improvement with dexilant  but symptoms continue -Continue dexilant , will add on pepcid  -Will add on dicyclomine to take before food to help with pain -Will add on zofran  for nausea -Will get gastric emptying study - will add on remeron low dose 7.5 mg at night  Constipation but state he has diarrhea, but can have days without BM Seen on CT 08/09/2023 - get KUB, may be contributing ot symptoms - consider linzess if it shows constipation - will check labs  - may need to consider endoscopy or further labs pending KUB  Nonexertional chest pain/DOE Echo 12/26/2022 EF 55 to 60% thought to be GERD  Type 2 diabetes  Declined screening colonoscopy  Patient Care Team: Austine Lefort, MD as PCP - General (Family Medicine) Sheryle Donning, MD as PCP - Cardiology (Cardiology)  HISTORY OF PRESENT ILLNESS: 78 y.o. male with a past medical history of  hyperlipidemia, palpitations, chronic SOB, diabetes mellitus type 2, gout and GERD.  Past cholecystectomy, HTN and others listed below presents for evaluation of   Daughter is here and translates. Patient last seen in the hospital 08/24/2023 by Dr. Venice Gillis for nausea and epigastric pain.  EGD 08/2021 mild esophagitis gastritis negative H. pylori CTAP 08/09/2023 did not identify any  intra-abdominal/pelvic pathology to explain his symptoms   Discussed the use of AI scribe software for clinical note transcription with the patient, who gave verbal consent to proceed.  History of Present Illness   Patrick Marshall is a 78 year old male who presents with abdominal pain and nausea.  He was hospitalized in May for abdominal pain and nausea. A CT scan during that time showed a significant amount of stool, but he experiences diarrhea rather than constipation. His bowel movements are loose, occurring twice this morning and similarly the previous day, with some days without bowel movements.  He has a lack of appetite, stating he does not feel hungry and experiences burning in his stomach with food intake. He has lost weight and experiences early satiety. He is taking Dexilant  once daily, which initially provided relief but is now ineffective.  He experiences heartburn radiating from his stomach to his chest, sometimes affecting his breathing. He reports nausea, particularly in the mornings, and a dry mouth. Drinking fluids causes a burning sensation, but he denies fever, significant bloating, and black or bloody stools. He does not experience significant bloating or gas, and he does not burp.        He  reports that he quit smoking about 32 years ago. His smoking use included cigarettes. He started smoking about 35 years ago. He has never used smokeless tobacco. He reports that he does not drink alcohol and does not use drugs.  Wt Readings from Last 10 Encounters:  09/12/23 205 lb 9.6 oz (93.3 kg)  08/27/23 211 lb (95.7 kg)  08/09/23 220 lb (99.8 kg)  07/16/23 224 lb 9.6 oz (101.9 kg)  12/08/22 223 lb 1.6 oz (101.2 kg)  11/07/22 220  lb (99.8 kg)  11/03/22 221 lb (100.2 kg)  09/08/22 223 lb 6.4 oz (101.3 kg)  08/31/22 224 lb 13.9 oz (102 kg)  08/14/22 226 lb (102.5 kg)    RELEVANT GI HISTORY, IMAGING AND LABS: Results   RADIOLOGY Abdominal CT: Significant stool  burden, otherwise unremarkable (07/2023)  DIAGNOSTIC Endoscopy: Revealed inflammation, no malignancy or other significant findings     EGD 09/13/2021: - Mild esophagitis but no other endoscopic esophageal abnormality to explain patient's dysphagia. Esophagus dilated. Dilated. Biopsied. - Gastritis. Biopsied. - Erythematous duodenopathy. Biopsied. 1. Surgical [P], duodenal FRAGMENTS OF DUODENAL MUCOSA WITH NORMAL VILLOUS AND CRYPT ARCHITECTURE. MILD INFLAMMATION IS SEEN BUT NO INCREASE IN INTRAEPITHELIAL LYMPHOCYTES IS NOTED. NO DYSPLASIA OR MALIGNANCY IS SEEN. NO HISTOLOGIC EVIDENCE OF CELIAC SPRUE IS SEEN. 2. Surgical [P], gastritis MILD CHRONIC GASTRITIS. NO SIGNIFICANT ACTIVITY, DYSPLASIA OR MALIGNANCY IS SEEN ALCIAN BLUE STAIN WITH APPROPRIATE CONTROLS IS NEGATIVE FOR INTESTINAL METAPLASIA. IMMUNOHISTOCHEMISTRY FOR H. PYLORI IS NEGATIVE FOR H PYLORI-LIKE ORGANISMS. 3. Surgical [P], mid esophagus and proximal esophagus FRAGMENTS OF INFLAMED SQUAMOUS AND GLANDULAR MUCOSA WITH RARE CELL SHOWING INTESTINAL METAPLASIA CONFIRMED BY ALCIAN BLUE STAIN WITH APPROPRIATE CONTROLS. NO SIGNIFICANT NUMBER OF EOSINOPHILS ARE SEEN. NO DYSPLASIA OR MALIGNANCY IS SEEN. CLINICAL AND ENDOSCOPIC CORELATION IS REQUIRED. 4. Surgical [P], distal esophagus FRAGMENTS OF SQUAMOUS MUCOSA SHOWING MILD INFLAMMATION.. NO SIGNIFICANT NUMBER OF EOSINOPHILS, DYSPLASIA OR MALIGNANCY IS SEEN. CLINICAL AND ENDOSCOPIC CORRELATION IS REQUIRED.    RUQ sono 03/16/2021: 1. 1.6 cm hypoechoic indeterminate right hepatic lesion. Recommend further evaluation with MRI. 2. Heterogeneous liver echotexture may be related to diffuse hepatocellular disease. Please correlate clinically. 3. Cholecystectomy.   Abdominal MRI/ 04/13/2021: 1. 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 This lesion is benign and requires no further workup. CBC    Component Value Date/Time   WBC 6.1 08/25/2023 0523   RBC 4.98 08/25/2023 0523   HGB  16.3 08/25/2023 0523   HCT 50.0 08/25/2023 0523   PLT 230 08/25/2023 0523   MCV 100.4 (H) 08/25/2023 0523   MCH 32.7 08/25/2023 0523   MCHC 32.6 08/25/2023 0523   RDW 13.2 08/25/2023 0523   LYMPHSABS 1.6 08/24/2023 0642   MONOABS 0.5 08/24/2023 0642   EOSABS 0.0 08/24/2023 0642   BASOSABS 0.0 08/24/2023 0642   Recent Labs    10/18/22 0812 08/09/23 0635 08/24/23 0642 08/24/23 1229 08/25/23 0523  HGB 14.6 17.2* 16.2 15.3 16.3    CMP     Component Value Date/Time   NA 135 08/25/2023 0523   K 4.1 08/25/2023 0523   CL 104 08/25/2023 0523   CO2 24 08/25/2023 0523   GLUCOSE 105 (H) 08/25/2023 0523   BUN 15 08/25/2023 0523   CREATININE 1.18 08/25/2023 0523   CREATININE 1.24 03/23/2022 1255   CALCIUM  8.6 (L) 08/25/2023 0523   PROT 6.7 08/24/2023 0642   ALBUMIN 4.0 08/24/2023 0642   AST 15 08/24/2023 0642   ALT 19 08/24/2023 0642   ALKPHOS 57 08/24/2023 0642   BILITOT 1.2 08/24/2023 0642   GFRNONAA >60 08/25/2023 0523   GFRNONAA 61 08/27/2020 1546   GFRAA 71 08/27/2020 1546      Latest Ref Rng & Units 08/24/2023    6:42 AM 08/09/2023    6:35 AM 10/18/2022    8:12 AM  Hepatic Function  Total Protein 6.5 - 8.1 g/dL 6.7  6.9  7.2   Albumin 3.5 - 5.0 g/dL 4.0  4.4  4.1   AST 15 - 41 U/L 15  21  19   ALT 0 - 44 U/L 19  23  24    Alk Phosphatase 38 - 126 U/L 57  80  66   Total Bilirubin 0.0 - 1.2 mg/dL 1.2  1.3  1.0   Bilirubin, Direct 0.0 - 0.2 mg/dL  0.3        Current Medications:    Current Outpatient Medications (Cardiovascular):    nitroGLYCERIN  (NITROSTAT ) 0.4 MG SL tablet, DISSOLVE ONE TABLET UNDER THE TONGUE EVERY 5 MINUTES AS NEEDED FOR CHEST PAIN.  DO NOT EXCEED A TOTAL OF 3 DOSES IN 15 MINUTES (Patient taking differently: Place 0.4 mg under the tongue every 5 (five) minutes as needed for chest pain.)  Current Outpatient Medications (Respiratory):    magic mouthwash (lidocaine , diphenhydrAMINE, alum & mag hydroxide) suspension*, Swish and swallow 15 mLs 3  (three) times daily.    Current Outpatient Medications (Other):    dicyclomine (BENTYL) 20 MG tablet, Take 1 tablet (20 mg total) by mouth 3 (three) times daily as needed for spasms (para dolar).   famotidine  (PEPCID ) 40 MG tablet, Take 1 tablet (40 mg total) by mouth at bedtime. Para reflujo   mirtazapine (REMERON) 7.5 MG tablet, Take 1 tablet (7.5 mg total) by mouth at bedtime. para el apetito   ondansetron  (ZOFRAN ) 4 MG tablet, Take 1 tablet (4 mg total) by mouth every 8 (eight) hours as needed for nausea or vomiting.   dexlansoprazole  (DEXILANT ) 60 MG capsule, Take 1 capsule (60 mg total) by mouth daily.   ketorolac  (ACULAR ) 0.5 % ophthalmic solution, INSTILL 1 DROP INTO LEFT EYE TWICE DAILY   magic mouthwash (lidocaine , diphenhydrAMINE, alum & mag hydroxide) suspension*, Swish and swallow 15 mLs 3 (three) times daily. * These medications belong to multiple therapeutic classes and are listed under each applicable group.  Medical History:  Past Medical History:  Diagnosis Date   Cataract    Diabetes mellitus without complication (HCC)    x few years.   GERD (gastroesophageal reflux disease)    Glaucoma    Gout    Heart disease    HLD (hyperlipidemia)    Hypertension    Allergies:  Allergies  Allergen Reactions   Penicillins Other (See Comments)    Difficulty sleeping     Surgical History:  He  has a past surgical history that includes Cholecystectomy; Eye surgery; Upper gi endoscopy (2023); and Esophagogastroduodenoscopy (N/A, 08/25/2023). Family History:  His family history includes Heart disease in his mother; Heart failure in his father.  REVIEW OF SYSTEMS  : All other systems reviewed and negative except where noted in the History of Present Illness.  PHYSICAL EXAM: BP 118/78   Pulse 90   Ht 5' 2 (1.575 m)   Wt 205 lb 9.6 oz (93.3 kg)   SpO2 99%   BMI 37.60 kg/m  Physical Exam   GENERAL APPEARANCE: Well nourished, in no apparent distress. HEENT: No cervical  lymphadenopathy, unremarkable thyroid , sclerae anicteric, conjunctiva pink. RESPIRATORY: Respiratory effort normal, breath sounds clear to auscultation bilaterally without rales, rhonchi, or wheezing. CARDIO: Regular rate and rhythm with no murmurs, rubs, or gallops, peripheral pulses intact. ABDOMEN: Soft, non-distended, active bowel sounds in all four quadrants, no tenderness to palpation, no rebound, no mass appreciated. RECTAL: Declines. MUSCULOSKELETAL: Full range of motion, normal gait, without edema. SKIN: Dry, intact without rashes or lesions. No jaundice. NEURO: Alert, oriented, no focal deficits. PSYCH: Cooperative, normal mood and affect.      Edmonia Gottron, PA-C 11:18 AM

## 2023-09-12 ENCOUNTER — Ambulatory Visit
Admission: RE | Admit: 2023-09-12 | Discharge: 2023-09-12 | Disposition: A | Discharge status: Home / Self Care | Source: Ambulatory Visit | Attending: Physician Assistant | Admitting: Physician Assistant

## 2023-09-12 ENCOUNTER — Ambulatory Visit: Admitting: Physician Assistant

## 2023-09-12 ENCOUNTER — Other Ambulatory Visit (INDEPENDENT_AMBULATORY_CARE_PROVIDER_SITE_OTHER)

## 2023-09-12 ENCOUNTER — Encounter: Payer: Self-pay | Admitting: Physician Assistant

## 2023-09-12 VITALS — BP 118/78 | HR 90 | Ht 62.0 in | Wt 205.6 lb

## 2023-09-12 DIAGNOSIS — K5904 Chronic idiopathic constipation: Secondary | ICD-10-CM

## 2023-09-12 DIAGNOSIS — K219 Gastro-esophageal reflux disease without esophagitis: Secondary | ICD-10-CM

## 2023-09-12 DIAGNOSIS — R110 Nausea: Secondary | ICD-10-CM | POA: Diagnosis not present

## 2023-09-12 DIAGNOSIS — R630 Anorexia: Secondary | ICD-10-CM

## 2023-09-12 DIAGNOSIS — R634 Abnormal weight loss: Secondary | ICD-10-CM

## 2023-09-12 DIAGNOSIS — R1013 Epigastric pain: Secondary | ICD-10-CM | POA: Diagnosis not present

## 2023-09-12 DIAGNOSIS — K5900 Constipation, unspecified: Secondary | ICD-10-CM

## 2023-09-12 LAB — COMPREHENSIVE METABOLIC PANEL WITH GFR
ALT: 50 U/L (ref 0–53)
AST: 24 U/L (ref 0–37)
Albumin: 4.4 g/dL (ref 3.5–5.2)
Alkaline Phosphatase: 63 U/L (ref 39–117)
BUN: 12 mg/dL (ref 6–23)
CO2: 33 meq/L — ABNORMAL HIGH (ref 19–32)
Calcium: 9.5 mg/dL (ref 8.4–10.5)
Chloride: 103 meq/L (ref 96–112)
Creatinine, Ser: 1.26 mg/dL (ref 0.40–1.50)
GFR: 55.32 mL/min — ABNORMAL LOW (ref >60.00)
Glucose, Bld: 124 mg/dL — ABNORMAL HIGH (ref 70–99)
Potassium: 4.1 meq/L (ref 3.5–5.1)
Sodium: 140 meq/L (ref 135–145)
Total Bilirubin: 0.8 mg/dL (ref 0.2–1.2)
Total Protein: 7.3 g/dL (ref 6.0–8.3)

## 2023-09-12 LAB — CBC WITH DIFFERENTIAL/PLATELET
Basophils Absolute: 0.1 10*3/uL (ref 0.0–0.1)
Basophils Relative: 1 % (ref 0.0–3.0)
Eosinophils Absolute: 0.1 10*3/uL (ref 0.0–0.7)
Eosinophils Relative: 0.9 % (ref 0.0–5.0)
HCT: 49.2 % (ref 39.0–52.0)
Hemoglobin: 16.4 g/dL (ref 13.0–17.0)
Lymphocytes Relative: 18.7 % (ref 12.0–46.0)
Lymphs Abs: 1.2 10*3/uL (ref 0.7–4.0)
MCHC: 33.4 g/dL (ref 30.0–36.0)
MCV: 96.4 fl (ref 78.0–100.0)
Monocytes Absolute: 0.4 10*3/uL (ref 0.1–1.0)
Monocytes Relative: 6.6 % (ref 3.0–12.0)
Neutro Abs: 4.5 10*3/uL (ref 1.4–7.7)
Neutrophils Relative %: 72.8 % (ref 43.0–77.0)
Platelets: 224 10*3/uL (ref 150.0–400.0)
RBC: 5.1 Mil/uL (ref 4.22–5.81)
RDW: 13.9 % (ref 11.5–15.5)
WBC: 6.1 10*3/uL (ref 4.0–10.5)

## 2023-09-12 LAB — IBC + FERRITIN
Ferritin: 39 ng/mL (ref 22.0–322.0)
Iron: 86 ug/dL (ref 42–165)
Saturation Ratios: 22.8 % (ref 20.0–50.0)
TIBC: 376.6 ug/dL (ref 250.0–450.0)
Transferrin: 269 mg/dL (ref 212.0–360.0)

## 2023-09-12 LAB — SEDIMENTATION RATE: Sed Rate: 5 mm/h (ref 0–20)

## 2023-09-12 LAB — TSH: TSH: 0.91 u[IU]/mL (ref 0.35–5.50)

## 2023-09-12 MED ORDER — ONDANSETRON HCL 4 MG PO TABS: 4.0000 mg | ORAL_TABLET | Freq: Three times a day (TID) | ORAL | 1 refills | Status: DC | PRN

## 2023-09-12 MED ORDER — FAMOTIDINE 40 MG PO TABS: 40.0000 mg | ORAL_TABLET | Freq: Every day | ORAL | 0 refills | Status: DC

## 2023-09-12 MED ORDER — DICYCLOMINE HCL 20 MG PO TABS: 20.0000 mg | ORAL_TABLET | Freq: Three times a day (TID) | ORAL | 0 refills | Status: DC | PRN

## 2023-09-12 MED ORDER — MIRTAZAPINE 7.5 MG PO TABS: 7.5000 mg | ORAL_TABLET | Freq: Every day | ORAL | 0 refills | Status: DC

## 2023-09-12 NOTE — Patient Instructions (Signed)
 Su proveedor le ha solicitado que vaya al stano para Education officer, environmental anlisis de laboratorio antes de irse hoy. Presione B en el ascensor. El laboratorio est ubicado en la primera puerta a la izquierda al salir del Medical sales representative.   Tome su inhibidor de la bomba de protones, dexilant   Agregue Pepcid  40 mg por la noche. Suspenda la crcuma. Evite las comidas picantes y cidas. Evite las comidas grasas. Limite el consumo de caf, t, alcohol y bebidas carbonatadas. Esfurcese por mantener un peso saludable. Mantenga la cabecera de la cama elevada al menos 7,5 cm con bloques o una almohada de cua si presenta algn sntoma nocturno. Permanezca erguido durante 2 horas despus de comer. Evite las comidas y refrigerios de 3 a 4 horas antes de [place].  Puede tomar Zofran  para nausea y diciclomina ( para duele)  por la maana antes de comer y Teacher, adult education tres veces al da antes de comer para aliviar las nuseas y Chief Technology Officer. Me Carloyn Chi a hacer una prueba para evaluar la funcin estomacal.  La gastroparesia se produce cuando el estmago no se mueve bien y puede causar dolor abdominal, nuseas, sensacin de plenitud y prdida de [place]. Nos estn realizando una prueba para evaluar esto.  You have been scheduled for a gastric emptying scan at [facility] on 10/01/23 at 7:30am. Please arrive at least 30 minutes prior to your appointment for registration. Please make certain not to have anything to eat or drink after midnight the night before your test. Hold all stomach medications (ex: Zofran , phenergan, Reglan ) 24 hours prior to your test. If you need to reschedule your appointment, please contact radiology scheduling at [phone]. _____________________________________________________________________ A gastric-emptying study measures how long it takes for food to move through your stomach. There are several ways to measure stomach emptying. In the most common test, you eat food that contains a small amount of  radioactive material. A scanner that detects the movement of the radioactive material is placed over your abdomen to monitor the rate at which food leaves your stomach. This test normally takes about 4 hours to complete. _____________________________________________________________________  Please follow up sooner if symptoms increase or worsen  Due to recent changes in healthcare laws, you may see the results of your imaging and laboratory studies on MyChart before your provider has had a chance to review them.  We understand that in some cases there may be results that are confusing or concerning to you. Not all laboratory results come back in the same time frame and the provider may be waiting for multiple results in order to interpret others.  Please give us  48 hours in order for your provider to thoroughly review all the results before contacting the office for clarification of your results.   Thank you for trusting me with your gastrointestinal care!   Santina Cull, PA-C _______________________________________________________  If your blood pressure at your visit was 140/90 or greater, please contact your primary care physician to follow up on this.  _______________________________________________________  If you are age 23 or older, your body mass index should be between 23-30. Your Body mass index is 37.6 kg/m. If this is out of the aforementioned range listed, please consider follow up with your Primary Care Provider.  If you are age 66 or younger, your body mass index should be between 19-25. Your Body mass index is 37.6 kg/m. If this is out of the aformentioned range listed, please consider follow up with your Primary Care Provider.   ________________________________________________________  The [facility]  providers would like to encourage you to use MYCHART to communicate with providers for non-urgent requests or questions.  Due to long hold times on the telephone, sending your  provider a message by [facility] may be a faster and more efficient way to get a response.  Please allow 48 business hours for a response.  Please remember that this is for non-urgent requests.  _______________________________________________________

## 2023-09-13 ENCOUNTER — Ambulatory Visit: Payer: Self-pay | Admitting: Physician Assistant

## 2023-10-01 ENCOUNTER — Ambulatory Visit (HOSPITAL_COMMUNITY)
Admission: RE | Admit: 2023-10-01 | Discharge: 2023-10-01 | Disposition: A | Discharge status: Home / Self Care | Source: Ambulatory Visit | Attending: Physician Assistant | Admitting: Physician Assistant

## 2023-10-01 DIAGNOSIS — R110 Nausea: Secondary | ICD-10-CM | POA: Insufficient documentation

## 2023-10-01 DIAGNOSIS — K219 Gastro-esophageal reflux disease without esophagitis: Secondary | ICD-10-CM | POA: Diagnosis not present

## 2023-10-01 DIAGNOSIS — K5904 Chronic idiopathic constipation: Secondary | ICD-10-CM | POA: Insufficient documentation

## 2023-10-01 DIAGNOSIS — R634 Abnormal weight loss: Secondary | ICD-10-CM | POA: Insufficient documentation

## 2023-10-01 DIAGNOSIS — R1013 Epigastric pain: Secondary | ICD-10-CM | POA: Insufficient documentation

## 2023-10-01 DIAGNOSIS — R109 Unspecified abdominal pain: Secondary | ICD-10-CM | POA: Diagnosis not present

## 2023-10-01 MED ORDER — TECHNETIUM TC 99M SULFUR COLLOID
2.0000 | Freq: Once | INTRAVENOUS | Status: AC | PRN
  Administered 2023-10-01: 2 via INTRAVENOUS

## 2023-10-04 ENCOUNTER — Ambulatory Visit: Admitting: Gastroenterology

## 2023-10-05 ENCOUNTER — Telehealth: Payer: Self-pay

## 2023-10-05 MED ORDER — MIRTAZAPINE 15 MG PO TABS: 15.0000 mg | ORAL_TABLET | Freq: Every day | ORAL | 0 refills | Status: AC

## 2023-10-05 NOTE — Telephone Encounter (Signed)
 Please see if patient is taking the mirtazapine  7.5 mg in the evening, if they feel like this is remotely helping with appetite and potentially pain we can increase this to 15 mg daily.  Continue Dexilant  in the morning, make sure he is on Pepcid  in the evening 40 mg.  They can try and pick up samples of Linzess 145 mcg on Monday to start taking this can help with abdominal pain as well as bowel habits continue the MiraLAX  over the weekend but he would stop once he started the Linzess samples.  Will also discuss with Dr. Charlanne on Monday any further recommendations.

## 2023-10-05 NOTE — Telephone Encounter (Signed)
 Inbound call from patient daughter nataly calling in regards to results. Please advise.

## 2023-10-05 NOTE — Telephone Encounter (Signed)
 Copied from CRM [id number]. Topic: Clinical - Lab/Test Results >> Oct 05, 2023  2:03 PM Fonda T wrote: Reason for CRM: Patient daughter, [name], [place] verified, calling inquiring on imaging results on behalf of patient.   Patient daughter, requesting a phone call from office to discuss results, at phone [phone], or [phone].  Thank you

## 2023-10-05 NOTE — Telephone Encounter (Signed)
 Inbound call from patient daughter zoila requesting f/u call. States patient is still in pain. Please advise

## 2023-10-08 ENCOUNTER — Other Ambulatory Visit (HOSPITAL_COMMUNITY): Payer: Self-pay

## 2023-10-08 ENCOUNTER — Other Ambulatory Visit: Payer: Self-pay | Admitting: Physician Assistant

## 2023-10-08 ENCOUNTER — Telehealth: Payer: Self-pay

## 2023-10-08 NOTE — Telephone Encounter (Signed)
 Continue mirtazapine  at 15 mg Normal CT, normal GES, normal EGD Offer to test for SIBO versus treat with flagyl 250mg  TID for 10 days which match his symptoms  Small intestinal bacterial overgrowth (SIBO) occurs when there is an abnormal increase in the overall bacterial population in the small intestine -- particularly types of bacteria not commonly found in that part of the digestive tract. Small intestinal bacterial overgrowth (SIBO) commonly results when a circumstance -- such as surgery or disease -- slows the passage of food and waste products in the digestive tract, creating a breeding ground for bacteria.  Signs and symptoms of SIBO often include: Loss of appetite Abdominal pain Nausea Bloating An uncomfortable feeling of fullness after eating Diarrhea or constipation, depending on the type of gas produced  What foods trigger SIBO? While foods aren't the original cause of SIBO, certain foods do encourage the overgrowth of the wrong bacteria in your small intestine. If you're feeding them their favorite foods, they're going to grow more, and that will trigger more of your SIBO symptoms. By the same token, you can help reduce the overgrowth by starving the problematic bacteria of their favorite foods. This strategy has led to a number of proposed SIBO eating plans. The plans vary, and so do individual results. But in general, they tend to recommend limiting carbohydrates.  These include: Sugars and sweeteners. Fruits and starchy vegetables. Dairy products. Grains.  There is a test for this we can do called a breath test, if you are positive we will treat you with an antibiotic to see if it helps.  Your symptoms are very suspicious for this condition, as discussed, we will start you on an antibiotic to see if this helps.

## 2023-10-08 NOTE — Telephone Encounter (Signed)
*  Gastro  Pharmacy Patient Advocate Encounter   Received notification from RX Request Messages that prior authorization for Mirtazapine  15mg   is required/requested.   Insurance verification completed.   The patient is insured through [facility] .   Per test claim: Refill too soon. PA is not needed at this time. Medication was filled 07/11. Next eligible fill date is 08/03.

## 2023-10-10 ENCOUNTER — Other Ambulatory Visit: Payer: Self-pay | Admitting: Physician Assistant

## 2023-10-12 ENCOUNTER — Other Ambulatory Visit: Payer: Self-pay

## 2023-10-12 ENCOUNTER — Telehealth: Payer: Self-pay | Admitting: Physician Assistant

## 2023-10-12 MED ORDER — METRONIDAZOLE 250 MG PO TABS: 250.0000 mg | ORAL_TABLET | Freq: Three times a day (TID) | ORAL | 0 refills | Status: AC

## 2023-10-12 NOTE — Telephone Encounter (Signed)
 Inbound call from patient daughter stating patient is still having abdominal pain after finishing recent medication. Requesting a call to discuss other options. Please advise, thank you

## 2023-10-12 NOTE — Telephone Encounter (Signed)
 Refer to phone note from today 10/12/23.

## 2023-10-12 NOTE — Telephone Encounter (Signed)
 Spoke with patient's daughter & she stated patient's symptoms remain the same, no improvement since increasing remeron  to 15 mg. Discussed message sent to patient on 10/08/23 regarding SIBO testing vs sending in flagyl. Daughter prefers we send in medication. Advised her that if symptoms are not improving after medication or has concerns sooner then to call us  back. She verbalized all understanding.

## 2023-10-19 NOTE — Telephone Encounter (Signed)
 Inbound call from patient, states after medication, patient has seen no improvement and he still has abdominal pain. She states he wakes up at 3am, feeling like hung over and feels unwell. She would like to know what else further they can do.

## 2023-10-23 NOTE — Telephone Encounter (Signed)
 Lm on vm for patient's daughter [name] to return call.

## 2023-10-24 ENCOUNTER — Other Ambulatory Visit: Payer: Self-pay | Admitting: Physician Assistant

## 2023-10-24 NOTE — Telephone Encounter (Signed)
 Patients daughter returning phone call. Also requesting to discuss metronidazole  medication. Please advise, thank you

## 2023-10-24 NOTE — Telephone Encounter (Signed)
 Called and spoke with Zoila. Zoila informed me that patient saw little improvement after Flagyl , patient is taking all prescribed GI meds as listed per Zoila. Patient continues to wake up everyday at 3 am with abdominal pain and feeling hung over. Patient does not drink alcohol and has not been eating any fried foods. I told Zoila that it may be best for patient to come in for a visit since he continues to have symptoms despite current medication regimen. It was hard clarifying medications, there was confusion between the Metronidazole  and Mirtazapine . Patient is scheduled for a follow up with Deanna, NP tomorrow afternoon at 1:30 pm. Zoila verbalized understanding and had no concerns at the end of the call.

## 2023-10-25 ENCOUNTER — Ambulatory Visit: Admitting: Gastroenterology

## 2023-10-25 ENCOUNTER — Encounter: Payer: Self-pay | Admitting: Gastroenterology

## 2023-10-25 VITALS — BP 112/74 | HR 70 | Ht 64.0 in | Wt 209.1 lb

## 2023-10-25 DIAGNOSIS — K219 Gastro-esophageal reflux disease without esophagitis: Secondary | ICD-10-CM | POA: Diagnosis not present

## 2023-10-25 DIAGNOSIS — R1013 Epigastric pain: Secondary | ICD-10-CM

## 2023-10-25 DIAGNOSIS — R110 Nausea: Secondary | ICD-10-CM | POA: Diagnosis not present

## 2023-10-25 DIAGNOSIS — R634 Abnormal weight loss: Secondary | ICD-10-CM

## 2023-10-25 NOTE — Progress Notes (Signed)
 Chief Complaint: Follow-up abdominal pain and nausea Primary GI Doctor:Dr. Charlanne  HPI: 78 y.o. male with a past medical history of  hyperlipidemia, palpitations, chronic SOB, diabetes mellitus type 2, gout, GERD, cholecystectomy, and HTN.  Patient last seen by Alan, PA on 09/12/23 for abdominal pain and nausea.  Abdominal x-ray and gastric emptying study ordered.  Abdominal x-ray showed small amount of stool buildup in colon.  Gastric emptying study was negative.  Patient seen in the hospital 08/24/2023 by Dr. Charlanne for nausea and epigastric pain.  EGD 08/2021 mild esophagitis gastritis negative H. Pylori. CTAP 08/09/2023 did not identify any intra-abdominal/pelvic pathology to explain his symptoms .  Interval History    Patient presents for follow-up on abdominal pain and nausea,accompanied by his daughter. Patient reports since he took the 10 days of the flagyl  for suspected Sibo his symptoms have improved. His appetite has also improved so he stopped the Remeron  prescribed. Patient reports he is sleeping better as well.     Patient has history of GERD and taking Dexilant  60 mg in the morning and famotidine  in the evening.     Patient also taking Dicyclomine  20 mg po once daily. Reports it causes dizziness. Patient has not been taking the Miralax  OTC, his bowels have been more regular. He had two bowel movements today. He does note stomach gurgling.   Patients last Ha1c 7.5, daughter reports he is not on any diabetes medication.  He denies ever having a screening colonoscopy.   Wt Readings from Last 3 Encounters:  10/25/23 209 lb 2 oz (94.9 kg)  09/12/23 205 lb 9.6 oz (93.3 kg)  08/27/23 211 lb (95.7 kg)    Past Medical History:  Diagnosis Date   Cataract    Diabetes mellitus without complication (HCC)    x few years.   GERD (gastroesophageal reflux disease)    Glaucoma    Gout    Heart disease    HLD (hyperlipidemia)    Hypertension     Past Surgical History:  Procedure  Laterality Date   CHOLECYSTECTOMY     ESOPHAGOGASTRODUODENOSCOPY N/A 08/25/2023   Procedure: EGD (ESOPHAGOGASTRODUODENOSCOPY);  Surgeon: Charlanne Groom, MD;  Location: [facility];  Service: Gastroenterology;  Laterality: N/A;   EYE SURGERY     2016 both eyes   UPPER GI ENDOSCOPY  2023    Current Outpatient Medications  Medication Sig Dispense Refill   dexlansoprazole  (DEXILANT ) 60 MG capsule Take 1 capsule (60 mg total) by mouth daily. 90 capsule 3   dicyclomine  (BENTYL ) 20 MG tablet TAKE 1 TABLET BY MOUTH 3 TIMES A DAY AS NEEDED FOR SPASMS 50 tablet 0   famotidine  (PEPCID ) 40 MG tablet Take 1 tablet (40 mg total) by mouth at bedtime. Para reflujo 90 tablet 0   nitroGLYCERIN  (NITROSTAT ) 0.4 MG SL tablet DISSOLVE ONE TABLET UNDER THE TONGUE EVERY 5 MINUTES AS NEEDED FOR CHEST PAIN.  DO NOT EXCEED A TOTAL OF 3 DOSES IN 15 MINUTES 50 tablet 0   mirtazapine  (REMERON ) 15 MG tablet Take 1 tablet (15 mg total) by mouth at bedtime. (Patient not taking: Reported on 10/25/2023) 30 tablet 0   No current facility-administered medications for this visit.    Allergies as of 10/25/2023 - Review Complete 10/25/2023  Allergen Reaction Noted   Penicillins Other (See Comments) 01/27/2012    Family History  Problem Relation Age of Onset   Heart disease Mother    Heart failure Father         Heart Surgery  died age 45   Esophageal cancer Neg Hx    Colon cancer Neg Hx    Stomach cancer Neg Hx     Review of Systems:    Constitutional: No weight loss, fever, chills, weakness or fatigue HEENT: Eyes: No change in vision               Ears, Nose, Throat:  No change in hearing or congestion Skin: No rash or itching Cardiovascular: No chest pain, chest pressure or palpitations   Respiratory: No SOB or cough Gastrointestinal: See HPI and otherwise negative Genitourinary: No dysuria or change in urinary frequency Neurological: No headache, dizziness or syncope Musculoskeletal: No new muscle or joint  pain Hematologic: No bleeding or bruising Psychiatric: No history of depression or anxiety    Physical Exam:  Vital signs: BP 112/74 (BP Location: Left Arm, Patient Position: Sitting, Cuff Size: Large)   Pulse 70   Ht 5' 4 (1.626 m)   Wt 209 lb 2 oz (94.9 kg)   BMI 35.90 kg/m   Constitutional:  Pleasant  male appears to be in NAD, Well developed, Well nourished, alert and cooperative Throat: Oral cavity and pharynx without inflammation, swelling or lesion.  Respiratory: Respirations even and unlabored. Lungs clear to auscultation bilaterally.   No wheezes, crackles, or rhonchi.  Cardiovascular: Normal S1, S2. Regular rate and rhythm. No peripheral edema, cyanosis or pallor.  Gastrointestinal:  Soft, nondistended, nontender. No rebound or guarding. Normal bowel sounds. No appreciable masses or hepatomegaly. Rectal:  Not performed.  Msk:  Symmetrical without gross deformities. Without edema, no deformity or joint abnormality.  Neurologic:  Alert and  oriented x4;  grossly normal neurologically.  Skin:   Dry and intact without significant lesions or rashes.  RELEVANT LABS AND IMAGING: CBC    Latest Ref Rng & Units 09/12/2023   10:51 AM 08/25/2023    5:23 AM 08/24/2023   12:29 PM  CBC  WBC 4.0 - 10.5 K/uL 6.1  6.1  5.9   Hemoglobin 13.0 - 17.0 g/dL 83.5  83.6  84.6   Hematocrit 39.0 - 52.0 % 49.2  50.0  46.1   Platelets 150.0 - 400.0 K/uL 224.0  230  216      CMP     Latest Ref Rng & Units 09/12/2023   10:51 AM 08/25/2023    5:23 AM 08/24/2023   12:29 PM  CMP  Glucose 70 - 99 mg/dL 875  894    BUN 6 - 23 mg/dL 12  15    Creatinine 9.59 - 1.50 mg/dL 8.73  8.81  8.66   Sodium 135 - 145 mEq/L 140  135    Potassium 3.5 - 5.1 mEq/L 4.1  4.1    Chloride 96 - 112 mEq/L 103  104    CO2 19 - 32 mEq/L 33  24    Calcium  8.4 - 10.5 mg/dL 9.5  8.6    Total Protein 6.0 - 8.3 g/dL 7.3     Total Bilirubin 0.2 - 1.2 mg/dL 0.8     Alkaline Phos 39 - 117 U/L 63     AST 0 - 37 U/L 24      ALT 0 - 53 U/L 50        Lab Results  Component Value Date   TSH 0.91 09/12/2023   Lab Results  Component Value Date   IRON 86 09/12/2023   TIBC 376.6 09/12/2023   FERRITIN 39.0 09/12/2023  08/25/23 Ha1c 7.5  RELEVANT GI HISTORY,  IMAGING AND LABS:  09/12/2023 abdominal x-ray 1 view IMPRESSION: Normal bowel gas pattern. Small volume of formed stool in the colon. 10/01/2023 gastric emptying study Normal gastric emptying study Abdominal CT: Significant stool burden, otherwise unremarkable (07/2023) DIAGNOSTIC Endoscopy: Revealed inflammation, no malignancy or other significant findings     EGD 09/13/2021: - Mild esophagitis but no other endoscopic esophageal abnormality to explain patient's dysphagia. Esophagus dilated. Dilated. Biopsied. - Gastritis. Biopsied. - Erythematous duodenopathy. Biopsied. 1. Surgical [P], duodenal FRAGMENTS OF DUODENAL MUCOSA WITH NORMAL VILLOUS AND CRYPT ARCHITECTURE. MILD INFLAMMATION IS SEEN BUT NO INCREASE IN INTRAEPITHELIAL LYMPHOCYTES IS NOTED. NO DYSPLASIA OR MALIGNANCY IS SEEN. NO HISTOLOGIC EVIDENCE OF CELIAC SPRUE IS SEEN. 2. Surgical [P], gastritis MILD CHRONIC GASTRITIS. NO SIGNIFICANT ACTIVITY, DYSPLASIA OR MALIGNANCY IS SEEN ALCIAN BLUE STAIN WITH APPROPRIATE CONTROLS IS NEGATIVE FOR INTESTINAL METAPLASIA. IMMUNOHISTOCHEMISTRY FOR H. PYLORI IS NEGATIVE FOR H PYLORI-LIKE ORGANISMS. 3. Surgical [P], mid esophagus and proximal esophagus FRAGMENTS OF INFLAMED SQUAMOUS AND GLANDULAR MUCOSA WITH RARE CELL SHOWING INTESTINAL METAPLASIA CONFIRMED BY ALCIAN BLUE STAIN WITH APPROPRIATE CONTROLS. NO SIGNIFICANT NUMBER OF EOSINOPHILS ARE SEEN. NO DYSPLASIA OR MALIGNANCY IS SEEN. CLINICAL AND ENDOSCOPIC CORELATION IS REQUIRED. 4. Surgical [P], distal esophagus FRAGMENTS OF SQUAMOUS MUCOSA SHOWING MILD INFLAMMATION.. NO SIGNIFICANT NUMBER OF EOSINOPHILS, DYSPLASIA OR MALIGNANCY IS SEEN. CLINICAL AND ENDOSCOPIC CORRELATION IS REQUIRED.    RUQ  sono 03/16/2021: 1. 1.6 cm hypoechoic indeterminate right hepatic lesion. Recommend further evaluation with MRI. 2. Heterogeneous liver echotexture Jinnifer Montejano be related to diffuse hepatocellular disease. Please correlate clinically. 3. Cholecystectomy.   Abdominal MRI/ 04/13/2021: 1. 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 This lesion is benign and requires no further workup.  Assessment: Encounter Diagnoses  Name Primary?   Gastroesophageal reflux disease, unspecified whether esophagitis present Yes   Abdominal pain, epigastric    Nausea without vomiting    Loss of weight        78 year old male patient who presents for follow-up on epigastric pain, decreased appetite, and nausea. Normal EGD [facility] 2025. Abdominal x-ray shows small volume of formed stool in the colon, otherwise normal. Normal gastric emptying study.   Patient treated for suspected SIBO with 10 days of Flagyl , symptoms have resolved.  Patient has gained 4 pounds and reports his appetite has improved.  Patient stopped Remeron  on own.  Patient has not required any antinausea medication.  We discussed continuing the Dexilant  and Pepcid  along with strict GERD diet.  Will go ahead and discontinue dicyclomine  as patient was only taking it once a day and had dizziness.  Patient reports his bowel habits are more regular and denies any diarrhea or constipation.  We did spend a few minutes discussing his diet and cutting out simple sugars and carbonated drinks.  Plan: -Continue Dexilant  60 mg po daily -Continue Pepcid  40 mg in the evening -Recommend GERD diet, no late meals 3-4 hours before lying down. -Recommend low fodmap diet  -Cut down on carbonated drinks and simple sugars -stop dicyclomine  due to dizziness -follow-up as needed  Thank you for the courtesy of this consult. Please call me with any questions or concerns.   Traxton Kolenda, FNP-C [facility] 10/25/2023, 3:50 PM  Cc: Duanne Butler DASEN, MD

## 2023-10-25 NOTE — Patient Instructions (Addendum)
 Continue Dexilant  60mg  po daily Continue Pepcid  40mg  in the evening Recommend GERD diet  Stop dicyclomine    Recommend low fodmap diet  Decrease carbonated drinks Decrease intake of bread, pasta, rice, sweets  Follow up as needed.  _______________________________________________________  If your blood pressure at your visit was 140/90 or greater, please contact your primary care physician to follow up on this.  _______________________________________________________  If you are age 77 or older, your body mass index should be between 23-30. Your Body mass index is 35.9 kg/m. If this is out of the aforementioned range listed, please consider follow up with your Primary Care Provider.  If you are age 80 or younger, your body mass index should be between 19-25. Your Body mass index is 35.9 kg/m. If this is out of the aformentioned range listed, please consider follow up with your Primary Care Provider.   ________________________________________________________  The [facility] providers would like to encourage you to use MYCHART to communicate with providers for non-urgent requests or questions.  Due to long hold times on the telephone, sending your provider a message by [facility] may be a faster and more efficient way to get a response.  Please allow 48 business hours for a response.  Please remember that this is for non-urgent requests.  _______________________________________________________  [facility] is using a team-based approach to care.  Your team is made up of your doctor and two to three APPS. Our APPS (Nurse Practitioners and Physician Assistants) work with your physician to ensure care continuity for you. They are fully qualified to address your health concerns and develop a treatment plan. They communicate directly with your gastroenterologist to care for you. Seeing the Advanced Practice Practitioners on your physician's team can help you by facilitating care more  promptly, often allowing for earlier appointments, access to diagnostic testing, procedures, and other specialty referrals.   Thank you for trusting me with your gastrointestinal care. Deanna May, NP-C

## 2023-10-31 ENCOUNTER — Telehealth: Payer: Self-pay | Admitting: Gastroenterology

## 2023-10-31 NOTE — Telephone Encounter (Signed)
 Inbound call from patient's daughter stating patient is still having complications with GERD and having abdominal pain. Requesting a call back. Please advise, thank you

## 2023-11-01 ENCOUNTER — Ambulatory Visit (INDEPENDENT_AMBULATORY_CARE_PROVIDER_SITE_OTHER): Admitting: Family Medicine

## 2023-11-01 ENCOUNTER — Encounter: Payer: Self-pay | Admitting: Family Medicine

## 2023-11-01 VITALS — BP 124/82 | HR 68 | Temp 97.7°F | Ht 64.0 in | Wt 207.6 lb

## 2023-11-01 DIAGNOSIS — R1013 Epigastric pain: Secondary | ICD-10-CM

## 2023-11-01 DIAGNOSIS — Z7984 Long term (current) use of oral hypoglycemic drugs: Secondary | ICD-10-CM | POA: Diagnosis not present

## 2023-11-01 DIAGNOSIS — E119 Type 2 diabetes mellitus without complications: Secondary | ICD-10-CM

## 2023-11-01 DIAGNOSIS — M25511 Pain in right shoulder: Secondary | ICD-10-CM

## 2023-11-01 MED ORDER — METRONIDAZOLE 500 MG PO TABS: 500.0000 mg | ORAL_TABLET | Freq: Three times a day (TID) | ORAL | 0 refills | Status: AC

## 2023-11-01 NOTE — Telephone Encounter (Signed)
 Daughter returned call & stated patient is still experiencing ongoing abdominal pain and reflux. He's taking 60 mg of dexilant  daily & pepcid  40 mg HS. He takes 20 mg prn of bentyl  which does provide some relief for the pain. He's waking up at 1 AM with burning in his chest. He does not eat late at night & sleeps elevated in a recliner. Daughter is requesting further recommendations. He was seen with Deanna on 10/25/23 for OV.

## 2023-11-01 NOTE — Telephone Encounter (Signed)
 Unable to leave message for patient's daughter Zoila to call back, VM box full.

## 2023-11-01 NOTE — Progress Notes (Signed)
 Subjective:    Patient ID: Patrick Marshall, male    DOB: 04-Oct-1945, 78 y.o.   MRN: [id number] 08/14/22 Patient has been dealing with chronic abdominal pain for quite some time.  Please see previous office notes.  Last [place] in [place]  was 6/23 which showed gastritis. He was lost to follow-up as he went back to [place]  [place] in [place] he had endoscopy due to his chronic abdominal pain and he was diagnosed with a hiatal hernia per his report.  He was also told that he had an ulcer in the antrum of the stomach and in the duodenal bulb along with chronic gastritis and erosive esophagitis.  He was placed on Nexium  twice daily as well as cinitaprida which is available only in [place].  He is here today requesting refills  11/03/22 Patient recently went to the emergency room and [facility].  He states for 3 weeks he has been feeling bad.  He reports pain underneath his xiphoid process.  The pain radiated up into his chest.  It hurts to lay down.  He reported nausea.  He reported a burning pain radiating up his throat.  He has stopped all of his medication.  The only medicine he was taking his aspirin .  Per his report, they did a CT scan in the emergency room that was negative.  They started him on omeprazole  40 mg daily.  At that time, my plan was: I believe the patient has chronic gastritis and his symptoms likely return because he stopped his medication.  Therefore I recommended that he continue omeprazole  40 mg daily for 3 months that he add Pepcid  40 mg daily and use Zofran  4 mg every 8 hours as needed for nausea.  Recheck if no better in 2 weeks or sooner if worse  11/07/22 Patient stopped taking his omeprazole  completely and was only taking famotidine .  He presents today with a burning pain in his epigastric area radiating up to his throat EKG shows normal sinus rhythm with normal intervals with no.  He denies any trouble breathing.  Patient rated his pain at a 10.  I gave him a GI  cocktail including viscous lidocaine  and his pain improved to a 3 within 5 minutes.  He denies any angina or shortness of breath.  He states that he has a difficult time eating.  It hurts to even drink water.  08/27/23 Since I last saw the patient, he apparently stopped all of his medications.  He states that the pantoprazole  medicine never helped him.  That is why he stopped it.  He was admitted to the hospital and had an EGD performed due to epigastric pain and esophageal pain.  EGD showed mild esophagitis.  Biopsies are pending.  He was started on pantoprazole  twice a day and then a GI cocktail 15 mL 3 times a day as needed.  He has not been taking the pantoprazole  since discharge from hospital because he states the medicine does not help.  He has been taking the GI cocktail.  Patient has also tried and failed omeprazole  and famotidine   11/01/23 Recently received a note from the patient's GI doctor.  They treated him with Flagyl  for possible SIBO the patient abdominal improved.  He stated in his visit that he was pain-free.  Therefore they released the patient.  They maintain him on Dexilant  and Pepcid .  He presents today and reports 1 week of severe abdominal pain similar to what he was  having before.  He states that the pain is no better.  He continues to report nausea and bloating.  He is on Dexilant  and Pepcid .  He states the symptoms are no better than they were before.  Patient also complains of pain in his right shoulder.  I have previously given him a cortisone injection in the shoulder and the pain improved for approximately 2 months but has returned.  He reports pain with overhead activity and abduction greater than 90 degrees.  I recommended physical therapy but he states he tried physical therapy in [place] and they told him he had a tear.  He would like to see an orthopedist for a second opinion.  He is also here for follow-up of his diabetes.  He is not taking metformin .  He has been off  metformin  now for almost 10 months.  He is not checking his sugars. Past Medical History:  Diagnosis Date   Cataract    Diabetes mellitus without complication (HCC)    x few years.   GERD (gastroesophageal reflux disease)    Glaucoma    Gout    Heart disease    HLD (hyperlipidemia)    Hypertension    Past Surgical History:  Procedure Laterality Date   CHOLECYSTECTOMY     ESOPHAGOGASTRODUODENOSCOPY N/A 08/25/2023   Procedure: EGD (ESOPHAGOGASTRODUODENOSCOPY);  Surgeon: Charlanne Groom, MD;  Location: [facility];  Service: Gastroenterology;  Laterality: N/A;   EYE SURGERY     2016 both eyes   UPPER GI ENDOSCOPY  2023   Current Outpatient Medications on File Prior to Visit  Medication Sig Dispense Refill   dexlansoprazole  (DEXILANT ) 60 MG capsule Take 1 capsule (60 mg total) by mouth daily. 90 capsule 3   dicyclomine  (BENTYL ) 20 MG tablet TAKE 1 TABLET BY MOUTH 3 TIMES A DAY AS NEEDED FOR SPASMS 50 tablet 0   famotidine  (PEPCID ) 40 MG tablet Take 1 tablet (40 mg total) by mouth at bedtime. Para reflujo 90 tablet 0   nitroGLYCERIN  (NITROSTAT ) 0.4 MG SL tablet DISSOLVE ONE TABLET UNDER THE TONGUE EVERY 5 MINUTES AS NEEDED FOR CHEST PAIN.  DO NOT EXCEED A TOTAL OF 3 DOSES IN 15 MINUTES 50 tablet 0   mirtazapine  (REMERON ) 15 MG tablet Take 1 tablet (15 mg total) by mouth at bedtime. (Patient not taking: Reported on 11/01/2023) 30 tablet 0   No current facility-administered medications on file prior to visit.   Allergies  Allergen Reactions   Penicillins Other (See Comments)    Difficulty sleeping   Social History   Socioeconomic History   Marital status: Married    Spouse name: Not on file   Number of children: 5   Years of education: Not on file   Highest education level: Not on file  Occupational History   Occupation: IT trainer  Tobacco Use   Smoking status: Former    Current packs/day: 0.00    Types: Cigarettes    Start date: 1990    Quit date: 1993    Years since  quitting: 32.6   Smokeless tobacco: Never  Vaping Use   Vaping status: Never Used  Substance and Sexual Activity   Alcohol use: Never    Comment: occasional   Drug use: No   Sexual activity: Not on file  Other Topics Concern   Not on file  Social History Narrative   Not on file   Social Drivers of Health   Financial Resource Strain: Not on file  Food Insecurity: No Food  Insecurity (08/24/2023)   Hunger Vital Sign    Worried About Running Out of Food in the Last Year: Never true    Ran Out of Food in the Last Year: Never true  Transportation Needs: No Transportation Needs (08/24/2023)   PRAPARE - Administrator, Civil Service (Medical): No    Lack of Transportation (Non-Medical): No  Physical Activity: Not on file  Stress: Not on file  Social Connections: Socially Isolated (08/24/2023)   Social Connection and Isolation Panel    Frequency of Communication with Friends and Family: More than three times a week    Frequency of Social Gatherings with Friends and Family: Twice a week    Attends Religious Services: Never    Database administrator or Organizations: No    Attends Banker Meetings: Never    Marital Status: Separated  Intimate Partner Violence: Not At Risk (08/24/2023)   Humiliation, Afraid, Rape, and Kick questionnaire    Fear of Current or Ex-Partner: No    Emotionally Abused: No    Physically Abused: No    Sexually Abused: No      Review of Systems  Gastrointestinal:  Positive for abdominal pain.  All other systems reviewed and are negative.      Objective:   Physical Exam Vitals reviewed.  Constitutional:      General: He is not in acute distress.    Appearance: Normal appearance. He is obese. He is not ill-appearing or toxic-appearing.  HENT:     Right Ear: Hearing, tympanic membrane and ear canal normal. There is no impacted cerumen.     Left Ear: Tympanic membrane and ear canal normal. Decreased hearing noted. There is no  impacted cerumen.  Neck:     Vascular: No carotid bruit.  Cardiovascular:     Rate and Rhythm: Normal rate and regular rhythm.     Pulses: Normal pulses.     Heart sounds: Normal heart sounds. No murmur heard.    No friction rub. No gallop.  Pulmonary:     Effort: Pulmonary effort is normal. No respiratory distress.     Breath sounds: Normal breath sounds. No stridor. No wheezing, rhonchi or rales.  Chest:     Chest wall: No tenderness.  Abdominal:     General: Abdomen is flat. Bowel sounds are normal. There is no distension.     Palpations: Abdomen is soft.     Tenderness: There is no abdominal tenderness. There is no guarding.  Musculoskeletal:     Right shoulder: Tenderness present. Decreased range of motion. Decreased strength.     Right lower leg: No edema.     Left lower leg: No edema.  Neurological:     Mental Status: He is alert.           Assessment & Plan:  Controlled type 2 diabetes mellitus without complication, without long-term current use of insulin  (HCC) - Plan: CBC with Differential/Platelet, Comprehensive metabolic panel with GFR, Hemoglobin A1c, Urine Albumin/Creatinine with ratio (send out) [LAB689]  Acute pain of right shoulder  Abdominal pain, epigastric Patient states the pain in his abdomen is no better.  This is less than 1 week after he was released from his gastroenterologist with resolution of his pain.  Therefore I am going to try the patient on Flagyl  500 mg 3 times daily for 14 days as they did.  If his pain improves while taking antibiotic, we may want to consider switching the patient to rifaximin for  possible IBS.  I recommended physical therapy for tendinitis of his rotator cuff.  Patient has tried and failed this in his home country of [place].  He would like to get a second opinion with the orthopedist will consult orthopedic.  Regarding his diabetes I will check an A1c.  If elevated I would recommend trying the patient on Actos versus Januvia  given his GI history

## 2023-11-02 LAB — CBC WITH DIFFERENTIAL/PLATELET
Absolute Lymphocytes: 2136 {cells}/uL (ref 850–3900)
Absolute Monocytes: 416 {cells}/uL (ref 200–950)
Basophils Absolute: 50 {cells}/uL (ref 0–200)
Basophils Relative: 0.8 %
Eosinophils Absolute: 170 {cells}/uL (ref 15–500)
Eosinophils Relative: 2.7 %
HCT: 48.4 % (ref 38.5–50.0)
Hemoglobin: 16 g/dL (ref 13.2–17.1)
MCH: 31.9 pg (ref 27.0–33.0)
MCHC: 33.1 g/dL (ref 32.0–36.0)
MCV: 96.6 fL (ref 80.0–100.0)
MPV: 11.8 fL (ref 7.5–12.5)
Monocytes Relative: 6.6 %
Neutro Abs: 3528 {cells}/uL (ref 1500–7800)
Neutrophils Relative %: 56 %
Platelets: 233 Thousand/uL (ref 140–400)
RBC: 5.01 Million/uL (ref 4.20–5.80)
RDW: 12.7 % (ref 11.0–15.0)
Total Lymphocyte: 33.9 %
WBC: 6.3 Thousand/uL (ref 3.8–10.8)

## 2023-11-02 LAB — COMPREHENSIVE METABOLIC PANEL WITH GFR
AG Ratio: 1.7 (calc) (ref 1.0–2.5)
ALT: 16 U/L (ref 9–46)
AST: 15 U/L (ref 10–35)
Albumin: 4.1 g/dL (ref 3.6–5.1)
Alkaline phosphatase (APISO): 59 U/L (ref 35–144)
BUN: 14 mg/dL (ref 7–25)
CO2: 31 mmol/L (ref 20–32)
Calcium: 9.4 mg/dL (ref 8.6–10.3)
Chloride: 103 mmol/L (ref 98–110)
Creat: 1.25 mg/dL (ref 0.70–1.28)
Globulin: 2.4 g/dL (ref 1.9–3.7)
Glucose, Bld: 122 mg/dL — ABNORMAL HIGH (ref 65–99)
Potassium: 4.3 mmol/L (ref 3.5–5.3)
Sodium: 140 mmol/L (ref 135–146)
Total Bilirubin: 0.7 mg/dL (ref 0.2–1.2)
Total Protein: 6.5 g/dL (ref 6.1–8.1)
eGFR: 60 mL/min/1.73m2 (ref >=60)

## 2023-11-02 LAB — HEMOGLOBIN A1C
Hgb A1c MFr Bld: 7.1 % — ABNORMAL HIGH (ref <5.7)
Mean Plasma Glucose: 157 mg/dL
eAG (mmol/L): 8.7 mmol/L

## 2023-11-02 NOTE — Telephone Encounter (Signed)
 Daughter made aware of NP recommendations.

## 2023-11-04 ENCOUNTER — Ambulatory Visit: Payer: Self-pay | Admitting: Family Medicine

## 2023-11-06 ENCOUNTER — Other Ambulatory Visit: Payer: Self-pay

## 2023-11-06 DIAGNOSIS — E119 Type 2 diabetes mellitus without complications: Secondary | ICD-10-CM

## 2023-11-06 MED ORDER — METFORMIN HCL 500 MG PO TABS: 500.0000 mg | ORAL_TABLET | Freq: Two times a day (BID) | ORAL | 3 refills | Status: DC

## 2023-11-07 ENCOUNTER — Telehealth: Payer: Self-pay | Admitting: Gastroenterology

## 2023-11-07 NOTE — Telephone Encounter (Signed)
 Inbound call from patients daughter stating that patient is still in pain and the medication he was given is not working. Requesting a call back to discuss other recommendations. Please advise.

## 2023-11-08 NOTE — Telephone Encounter (Signed)
 Daughter made aware of NP recommendations. Per daughter, patient is currently on flagyl . Reviewed chart & PCP did send in a 14 day supply on 11/01/23. Pt feels symptoms are not improving on it compared to last time. OV scheduled for first available 9/12 at 1:50 pm with Dr. Charlanne & advised daughter that if anything worsens to reach out to PCP for sooner eval or urgent care/ED. Daughter verbalized all understanding.

## 2023-11-08 NOTE — Telephone Encounter (Signed)
 Daughter Patrick Marshall called back stating patient's abd pain & reflux are not improving. He's increased his bentyl  to TID with no relief & started reflux gourmet last week as recommended (phone note 8/6). Daughter calling to find out if there is anything that can be recommend for pt.

## 2023-11-18 ENCOUNTER — Other Ambulatory Visit: Payer: Self-pay | Admitting: Family Medicine

## 2023-11-20 LAB — COMPREHENSIVE METABOLIC PANEL WITH GFR: eGFR: >90

## 2023-11-20 LAB — MICROALBUMIN / CREATININE URINE RATIO: Microalb Creat Ratio: <30

## 2023-11-20 LAB — PROTEIN / CREATININE RATIO, URINE: Creatinine, Urine: 0.83

## 2023-11-20 NOTE — Telephone Encounter (Signed)
 Unable to refill per protocol, Rx expired. Discontinued on 10/25/23, no longer taking.  Requested Prescriptions  Pending Prescriptions Disp Refills   ketorolac  (ACULAR ) 0.5 % ophthalmic solution [Pharmacy Med Name: Ketorolac  Tromethamine  0.5 % Ophthalmic Solution] 5 mL 0    Sig: INSTILL 1 DROP INTO LEFT EYE TWICE DAILY     Analgesics: NSAIDS 2 Passed - 11/20/2023 10:45 AM      Passed - HGB in normal range and within 360 days    Hemoglobin  Date Value Ref Range Status  11/01/2023 16.0 13.2 - 17.1 g/dL Final         Passed - Cr in normal range and within 360 days    Creat  Date Value Ref Range Status  11/01/2023 1.25 0.70 - 1.28 mg/dL Final         Passed - Patient is not pregnant      Passed - Valid encounter within last 12 months    Recent Outpatient Visits           2 weeks ago Controlled type 2 diabetes mellitus without complication, without long-term current use of insulin  [facility]   [facility] Duanne Butler DASEN, MD   2 months ago Erosive esophagitis   [place] [facility] Duanne Butler DASEN, MD   4 months ago Acute pain of right shoulder   [place] [facility] Duanne Butler DASEN, MD   1 year ago Chest pain, unspecified type   [place] Duanne Butler DASEN, MD   1 year ago Chronic gastritis without bleeding, unspecified gastritis type   [facility] [place], Butler DASEN, MD

## 2023-12-03 ENCOUNTER — Other Ambulatory Visit

## 2023-12-03 ENCOUNTER — Ambulatory Visit (INDEPENDENT_AMBULATORY_CARE_PROVIDER_SITE_OTHER): Admitting: Orthopedic Surgery

## 2023-12-03 ENCOUNTER — Other Ambulatory Visit (INDEPENDENT_AMBULATORY_CARE_PROVIDER_SITE_OTHER): Payer: Self-pay

## 2023-12-03 DIAGNOSIS — M542 Cervicalgia: Secondary | ICD-10-CM

## 2023-12-03 DIAGNOSIS — M65911 Unspecified synovitis and tenosynovitis, right shoulder: Secondary | ICD-10-CM | POA: Diagnosis not present

## 2023-12-03 DIAGNOSIS — M25511 Pain in right shoulder: Secondary | ICD-10-CM | POA: Diagnosis not present

## 2023-12-03 MED ORDER — MELOXICAM 7.5 MG PO TABS: ORAL_TABLET | ORAL | 0 refills | Status: DC

## 2023-12-05 ENCOUNTER — Encounter: Payer: Self-pay | Admitting: Orthopedic Surgery

## 2023-12-05 NOTE — Progress Notes (Unsigned)
 Office Visit Note   Patient: Patrick Marshall           Date of Birth: 1945/05/22           MRN: [id number] Visit Date: 12/03/2023 Requested by: Duanne Butler DASEN, MD [address] [place],  [place] [address] PCP: Duanne Butler DASEN, MD  Subjective: Chief Complaint  Patient presents with   Right Shoulder - Pain    HPI: Patrick Marshall is a 78 y.o. male who presents to the office reporting right shoulder and neck pain.  Been going on for over a year.  Started after he was trying to throw an object.  The pain does wake him from sleep at night.  Does have to assist the right arm with the left arm in terms of raising it up.  Describes right arm symptoms only with no left arm symptoms.  Occasional neck pain as well as numbness and tingling.  Does have diabetes.  Hemoglobin A1c 7.1.  No prior shoulder surgery.  Describes pain both day and night.  He works on a farm.  Hard for him to pick up things because of the right shoulder pain and weakness..                ROS: All systems reviewed are negative as they relate to the chief complaint within the history of present illness.  Patient denies fevers or chills.  Assessment & Plan: Visit Diagnoses:  1. Right shoulder pain, unspecified chronicity   2. 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  Symptoms ongoing for a year.  Day and night nature of the pain went itself potentially more towards right-sided radiculopathy.  Will try him on Mobic  7-1/2 mg a day for 21 days.  Ultrasound guided diminished post steroid injection into the glenohumeral joint is also performed.  MRI C-spine indicated to evaluate right-sided radiculopathy.  Please call his daughter who is Patrick Marshall at [phone] to set up the MRI scan.  Follow-Up Instructions: No follow-ups on file.   Orders:  Orders Placed This Encounter  Procedures   XR Shoulder Right   XR Cervical Spine 2 or 3 views    US  Guided Needle Placement - No Linked Charges   MR Cervical Spine w/o contrast   Meds ordered this encounter  Medications   meloxicam  (MOBIC ) 7.5 MG tablet    Sig: 1 po q d x 21days then prn    Dispense:  30 tablet    Refill:  0      Procedures: Large Joint Inj: R glenohumeral on 12/03/2023 10:12 PM Indications: diagnostic evaluation and pain Details: 22 G 3.5 in needle, ultrasound-guided posterior approach  Arthrogram: No  Medications: 9 mL bupivacaine  0.5 %; 5 mL lidocaine  1 %; 40 mg triamcinolone  acetonide 40 MG/ML Outcome: tolerated well, no immediate complications Procedure, treatment alternatives, risks and benefits explained, specific risks discussed. Consent was given by the patient. Immediately prior to procedure a time out was called to verify the correct patient, procedure, equipment, support staff and site/side marked as required. Patient was prepped and draped in the usual sterile fashion.       Clinical Data: No additional findings.  Objective: Vital Signs: There were no vitals taken for this visit.  Physical Exam:  Constitutional: Patient appears well-developed HEENT:  Head: Normocephalic Eyes:EOM are normal Neck: Normal range of motion Cardiovascular: Normal rate Pulmonary/chest: Effort normal Neurologic: Patient is alert Skin:  Skin is warm Psychiatric: Patient has normal mood and affect  Ortho Exam: Ortho exam demonstrates good cervical spine range of motion.  No definite paresthesias C5-T1.  Radial pulse intact bilaterally.  Reflexes symmetric 0 to 1+ out of 4 bilateral biceps and triceps.  Does have some weakness with external rotation on the right compared to the left.  Subscap strength is intact bilaterally.  No Popeye deformity of the right arm.  No discrete AC joint tenderness on the right.  Deltoid is functional.  He does have a little bit of coarseness with passive range of motion of that right shoulder.  He does not quite have 90 degrees of  forward flexion and abduction.  No muscle atrophy right arm versus left.  Specialty Comments:  No specialty comments available.  Imaging: No results found.   PMFS History: Patient Active Problem List   Diagnosis Date Noted   Esophageal pain 08/24/2023   Esophagitis determined by endoscopy 08/24/2023   Combined forms of age-related cataract of left eye 03/14/2018   Morbid obesity (HCC) 02/28/2018   Class 3 severe obesity with body mass index (BMI) of 40.0 to 44.9 in adult 02/28/2018   Lower urinary tract symptoms (LUTS) 02/28/2018   Dyslipidemia 01/28/2018   SOB (shortness of breath) 01/28/2018   Gout 12/12/2017   Prostate cancer screening 12/12/2017   Hyperlipidemia 03/12/2017   Type 2 diabetes mellitus without complication, without long-term current use of insulin  (HCC) 09/14/2016   Hypertension 09/14/2016   Pterygium eye, 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   GERD (gastroesophageal reflux disease)    Glaucoma    Gout    Heart disease    HLD (hyperlipidemia)    Hypertension     Family History  Problem Relation Age of Onset   Heart disease Mother    Heart failure Father         Heart Surgery  died age 30   Esophageal cancer Neg Hx    Colon cancer Neg Hx    Stomach cancer Neg Hx     Past Surgical History:  Procedure Laterality Date   CHOLECYSTECTOMY     ESOPHAGOGASTRODUODENOSCOPY N/A 08/25/2023   Procedure: EGD (ESOPHAGOGASTRODUODENOSCOPY);  Surgeon: Charlanne Groom, MD;  Location: [facility];  Service: Gastroenterology;  Laterality: N/A;   EYE SURGERY     2016 both eyes   UPPER GI ENDOSCOPY  2023   Social History   Occupational History   Occupation: IT trainer  Tobacco Use   Smoking status: Former    Current packs/day: 0.00    Types: Cigarettes    Start date: 1990    Quit date: 1993    Years since quitting: 32.7   Smokeless tobacco: Never  Vaping  Use   Vaping status: Never Used  Substance and Sexual Activity   Alcohol use: Never    Comment: occasional   Drug use: No   Sexual activity: Not on file

## 2023-12-06 ENCOUNTER — Encounter: Payer: Self-pay | Admitting: Orthopedic Surgery

## 2023-12-06 MED ORDER — LIDOCAINE HCL 1 % IJ SOLN
5.0000 mL | INTRAMUSCULAR | Status: AC | PRN
  Administered 2023-12-03: 5 mL

## 2023-12-06 MED ORDER — BUPIVACAINE HCL 0.5 % IJ SOLN
9.0000 mL | INTRAMUSCULAR | Status: AC | PRN
  Administered 2023-12-03: 9 mL via INTRA_ARTICULAR

## 2023-12-06 MED ORDER — TRIAMCINOLONE ACETONIDE 40 MG/ML IJ SUSP
40.0000 mg | INTRAMUSCULAR | Status: AC | PRN
  Administered 2023-12-03: 40 mg via INTRA_ARTICULAR

## 2023-12-07 ENCOUNTER — Other Ambulatory Visit (INDEPENDENT_AMBULATORY_CARE_PROVIDER_SITE_OTHER)

## 2023-12-07 ENCOUNTER — Ambulatory Visit: Admitting: Gastroenterology

## 2023-12-07 ENCOUNTER — Encounter: Payer: Self-pay | Admitting: Gastroenterology

## 2023-12-07 VITALS — BP 120/70 | HR 74 | Ht 64.0 in | Wt 209.2 lb

## 2023-12-07 DIAGNOSIS — R1013 Epigastric pain: Secondary | ICD-10-CM | POA: Diagnosis not present

## 2023-12-07 DIAGNOSIS — K219 Gastro-esophageal reflux disease without esophagitis: Secondary | ICD-10-CM

## 2023-12-07 DIAGNOSIS — K5909 Other constipation: Secondary | ICD-10-CM

## 2023-12-07 DIAGNOSIS — R1084 Generalized abdominal pain: Secondary | ICD-10-CM

## 2023-12-07 LAB — COMPREHENSIVE METABOLIC PANEL WITH GFR
ALT: 43 U/L (ref 0–53)
AST: 28 U/L (ref 0–37)
Albumin: 4.2 g/dL (ref 3.5–5.2)
Alkaline Phosphatase: 73 U/L (ref 39–117)
BUN: 20 mg/dL (ref 6–23)
CO2: 30 meq/L (ref 19–32)
Calcium: 9.1 mg/dL (ref 8.4–10.5)
Chloride: 103 meq/L (ref 96–112)
Creatinine, Ser: 1.14 mg/dL (ref 0.40–1.50)
GFR: 62.28 mL/min (ref >60.00)
Glucose, Bld: 116 mg/dL — ABNORMAL HIGH (ref 70–99)
Potassium: 4 meq/L (ref 3.5–5.1)
Sodium: 140 meq/L (ref 135–145)
Total Bilirubin: 0.4 mg/dL (ref 0.2–1.2)
Total Protein: 6.6 g/dL (ref 6.0–8.3)

## 2023-12-07 LAB — CBC
HCT: 45.8 % (ref 39.0–52.0)
Hemoglobin: 15.4 g/dL (ref 13.0–17.0)
MCHC: 33.5 g/dL (ref 30.0–36.0)
MCV: 96.6 fl (ref 78.0–100.0)
Platelets: 233 K/uL (ref 150.0–400.0)
RBC: 4.74 Mil/uL (ref 4.22–5.81)
RDW: 13.6 % (ref 11.5–15.5)
WBC: 9 K/uL (ref 4.0–10.5)

## 2023-12-07 LAB — LIPASE: Lipase: 17 U/L (ref 11.0–59.0)

## 2023-12-07 MED ORDER — NA SULFATE-K SULFATE-MG SULF 17.5-3.13-1.6 GM/177ML PO SOLN: 1.0000 | Freq: Once | ORAL | 0 refills | Status: AC

## 2023-12-07 NOTE — Progress Notes (Signed)
 Chief Complaint:   Referring Provider:  Duanne Butler DASEN, MD      ASSESSMENT AND PLAN;   #1. Epi pain- neg EGD, RUQ US , CT  #2. GERD- off dexilant , now on omeprazole   #3. Gen abdo pain with Chronic constipation. Neg CT AP 07/2023   Plan: -CBC, CMP, lipase -Continue omeprazole  20mg  po every day -Continue miralax  17g po every day -Continue bentyl  10mg  po PRN -Colon  (before Oct 20th, [place]   I discussed EGD/Colonoscopy- the indications, risks, alternatives and potential complications including, but not limited to bleeding, infection, reaction to meds, damage to internal organs, cardiac and/or pulmonary problems, and perforation requiring surgery. The possibility that significant findings could be missed was explained. All ? were answered. Pt consents to proceed.  HPI:    Patrick Marshall is a 78 y.o. male  with a past medical history of  hyperlipidemia, palpitations, chronic SOB, diabetes mellitus type 2, gout, GERD, cholecystectomy, and HTN.  Thru interpretor  History of Present Illness Patrick Marshall is a 78 year old male who presents with abdominal pain and gastrointestinal symptoms.  He experiences intermittent sharp pains in the upper abdomen, describing them as mild and less severe than before. His stomach often rumbles, indicating gastrointestinal discomfort. He has occasional diarrhea and constipation, for which he takes Miralax  to alleviate symptoms. No significant weight change has been noted, although he has started eating more, which might have stabilized his weight.  He experiences heartburn, which has recently calmed down. He was previously on Dexilant  60 mg but stopped due to uncertainty about its benefits. He occasionally takes over-the-counter omeprazole , approximately 20 mg, when he feels a burning sensation in his stomach, but not daily.  He denies seeing blood in his stool, although he mentions that sometimes his stool  appears dark. He does not regularly check for blood due to the nature of his toilet. He has not had a colonoscopy before but has had an endoscopy in the past.  He mentions burning or itching sensations, particularly when using the bathroom, which worsens with spicy food. He denies seeing blood when wiping.  He is not currently taking meloxicam  for arthritis symptoms and has stopped taking Remeron  15 mg, which was intended to improve his appetite.  He reports occasional diarrhea and constipation, and intermittent abdominal pain.    Patient seen in the hospital 08/24/2023 by Dr. Charlanne for nausea and epigastric pain.  EGD 08/2021 mild esophagitis gastritis negative H. Pylori. CTAP 08/09/2023 did not identify any intra-abdominal/pelvic pathology to explain his symptoms   Wt Readings from Last 3 Encounters:  12/07/23 209 lb 4 oz (94.9 kg)  11/01/23 207 lb 9.6 oz (94.2 kg)  10/25/23 209 lb 2 oz (94.9 kg)      RELEVANT GI HISTORY, IMAGING AND LABS:  Never had colonoscopy.   09/12/2023 abdominal x-ray 1 view IMPRESSION: Normal bowel gas pattern. Small volume of formed stool in the colon. 10/01/2023 gastric emptying study Normal gastric emptying study  Abdominal CT: Significant stool burden, otherwise unremarkable (07/2023) DIAGNOSTIC Endoscopy: Revealed inflammation, no malignancy or other significant findings    EGD 09/13/2021: - Mild esophagitis but no other endoscopic esophageal abnormality to explain patient's dysphagia. Esophagus dilated. Dilated. Biopsied. - Gastritis. Biopsied. - Erythematous duodenopathy. Biopsied. 1. Surgical [P], duodenal FRAGMENTS OF DUODENAL MUCOSA WITH NORMAL VILLOUS AND CRYPT ARCHITECTURE. MILD INFLAMMATION IS SEEN BUT NO INCREASE IN INTRAEPITHELIAL LYMPHOCYTES IS NOTED. NO DYSPLASIA OR MALIGNANCY IS SEEN. NO HISTOLOGIC  EVIDENCE OF CELIAC SPRUE IS SEEN. 2. Surgical [P], gastritis MILD CHRONIC GASTRITIS. NO SIGNIFICANT ACTIVITY, DYSPLASIA OR MALIGNANCY IS  SEEN ALCIAN BLUE STAIN WITH APPROPRIATE CONTROLS IS NEGATIVE FOR INTESTINAL METAPLASIA. IMMUNOHISTOCHEMISTRY FOR H. PYLORI IS NEGATIVE FOR H PYLORI-LIKE ORGANISMS. 3. Surgical [P], mid esophagus and proximal esophagus FRAGMENTS OF INFLAMED SQUAMOUS AND GLANDULAR MUCOSA WITH RARE CELL SHOWING INTESTINAL METAPLASIA CONFIRMED BY ALCIAN BLUE STAIN WITH APPROPRIATE CONTROLS. NO SIGNIFICANT NUMBER OF EOSINOPHILS ARE SEEN. NO DYSPLASIA OR MALIGNANCY IS SEEN. CLINICAL AND ENDOSCOPIC CORELATION IS REQUIRED. 4. Surgical [P], distal esophagus FRAGMENTS OF SQUAMOUS MUCOSA SHOWING MILD INFLAMMATION.. NO SIGNIFICANT NUMBER OF EOSINOPHILS, DYSPLASIA OR MALIGNANCY IS SEEN. CLINICAL AND ENDOSCOPIC CORRELATION IS REQUIRED.    RUQ sono 03/16/2021: 1. 1.6 cm hypoechoic indeterminate right hepatic lesion. Recommend further evaluation with MRI. 2. Heterogeneous liver echotexture may be related to diffuse hepatocellular disease. Please correlate clinically. 3. Cholecystectomy.   Abdominal MRI/ 04/13/2021: 1. 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 This lesion is benign and requires no further workup.  Ba swallow 10/2021 IMPRESSION: Mild esophageal dysmotility.  No evidence of stricture.   Small sliding-type hiatal hernia. No gastroesophageal reflux occurred during the exam.  Past Medical History:  Diagnosis Date   Cataract    Diabetes mellitus without complication (HCC)    x few years.   GERD (gastroesophageal reflux disease)    Glaucoma    Gout    Heart disease    HLD (hyperlipidemia)    Hypertension     Past Surgical History:  Procedure Laterality Date   CHOLECYSTECTOMY     ESOPHAGOGASTRODUODENOSCOPY N/A 08/25/2023   Procedure: EGD (ESOPHAGOGASTRODUODENOSCOPY);  Surgeon: Charlanne Groom, MD;  Location: [facility];  Service: Gastroenterology;  Laterality: N/A;   EYE SURGERY     2016 both eyes   UPPER GI ENDOSCOPY  2023    Family History  Problem Relation Age of Onset   Heart disease  Mother    Heart failure Father         Heart Surgery  died age 26   Esophageal cancer Neg Hx    Colon cancer Neg Hx    Stomach cancer Neg Hx     Social History   Tobacco Use   Smoking status: Former    Current packs/day: 0.00    Types: Cigarettes    Start date: 25    Quit date: 1993    Years since quitting: 32.7   Smokeless tobacco: Never  Vaping Use   Vaping status: Never Used  Substance Use Topics   Alcohol use: Never    Comment: occasional   Drug use: No    Current Outpatient Medications  Medication Sig Dispense Refill   dexlansoprazole  (DEXILANT ) 60 MG capsule Take 1 capsule (60 mg total) by mouth daily. 90 capsule 3   dicyclomine  (BENTYL ) 20 MG tablet TAKE 1 TABLET BY MOUTH 3 TIMES A DAY AS NEEDED FOR SPASMS 50 tablet 0   famotidine  (PEPCID ) 40 MG tablet Take 1 tablet (40 mg total) by mouth at bedtime. Para reflujo 90 tablet 0   meloxicam  (MOBIC ) 7.5 MG tablet 1 po q d x 21days then prn 30 tablet 0   metFORMIN  (GLUCOPHAGE ) 500 MG tablet Take 1 tablet (500 mg total) by mouth 2 (two) times daily with a meal. 180 tablet 3   mirtazapine  (REMERON ) 15 MG tablet Take 1 tablet (15 mg total) by mouth at bedtime. 30 tablet 0   nitroGLYCERIN  (NITROSTAT ) 0.4 MG SL tablet DISSOLVE ONE TABLET UNDER THE TONGUE EVERY  5 MINUTES AS NEEDED FOR CHEST PAIN.  DO NOT EXCEED A TOTAL OF 3 DOSES IN 15 MINUTES 50 tablet 0   No current facility-administered medications for this visit.    Allergies  Allergen Reactions   Penicillins Other (See Comments)    Difficulty sleeping    Review of Systems:  neg    Physical Exam:    BP 120/70 (BP Location: Left Arm, Patient Position: Sitting, Cuff Size: Large)   Pulse 74   Ht 5' 4 (1.626 m)   Wt 209 lb 4 oz (94.9 kg)   BMI 35.92 kg/m  Wt Readings from Last 3 Encounters:  12/07/23 209 lb 4 oz (94.9 kg)  11/01/23 207 lb 9.6 oz (94.2 kg)  10/25/23 209 lb 2 oz (94.9 kg)   Constitutional:  Well-developed, in no acute  distress. Psychiatric: Normal mood and affect. Behavior is normal.  Cardiovascular: Normal rate, regular rhythm. No edema Pulmonary/chest: Effort normal and breath sounds normal. No wheezing, rales or rhonchi. Abdominal: Soft, nondistended. Nontender. Bowel sounds active throughout. There are no masses palpable. No hepatomegaly. Rectal: Deferred Neurological: Alert and oriented to person place and time. Skin: Skin is warm and dry. No rashes noted.  Data Reviewed: I have personally reviewed following labs and imaging studies  CBC:    Latest Ref Rng & Units 11/01/2023   10:23 AM 09/12/2023   10:51 AM 08/25/2023    5:23 AM  CBC  WBC 3.8 - 10.8 Thousand/uL 6.3  6.1  6.1   Hemoglobin 13.2 - 17.1 g/dL 83.9  83.5  83.6   Hematocrit 38.5 - 50.0 % 48.4  49.2  50.0   Platelets 140 - 400 Thousand/uL 233  224.0  230     CMP:    Latest Ref Rng & Units 11/01/2023   10:23 AM 09/12/2023   10:51 AM 08/25/2023    5:23 AM  CMP  Glucose 65 - 99 mg/dL 877  875  894   BUN 7 - 25 mg/dL 14  12  15    Creatinine 0.70 - 1.28 mg/dL 8.74  8.73  8.81   Sodium 135 - 146 mmol/L 140  140  135   Potassium 3.5 - 5.3 mmol/L 4.3  4.1  4.1   Chloride 98 - 110 mmol/L 103  103  104   CO2 20 - 32 mmol/L 31  33  24   Calcium  8.6 - 10.3 mg/dL 9.4  9.5  8.6   Total Protein 6.1 - 8.1 g/dL 6.5  7.3    Total Bilirubin 0.2 - 1.2 mg/dL 0.7  0.8    Alkaline Phos 39 - 117 U/L  63    AST 10 - 35 U/L 15  24    ALT 9 - 46 U/L 16  50         Radiology Studies: XR Shoulder Right Result Date: 12/05/2023 AP lateral radiographs right shoulder reviewed.  Mild narrowing of the acromiohumeral distance present no significant glenohumeral joint arthritis.  No fracture or dislocation.  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  Normal lordosis.  No spondylolisthesis or compression fractures.  US  Guided Needle Placement -  No Linked Charges Result Date: 12/05/2023 Ultrasound imaging demonstrates needle placement into the glenohumeral joint with injection of fluid into the joint and no complicating features.      Anselm Bring, MD 12/07/2023, 2:06 PM  Cc: Duanne Butler DASEN, MD

## 2023-12-07 NOTE — Patient Instructions (Signed)
 _______________________________________________________  If your blood pressure at your visit was 140/90 or greater, please contact your primary care physician to follow up on this.  _______________________________________________________  If you are age 77 or older, your body mass index should be between 23-30. Your Body mass index is 35.92 kg/m. If this is out of the aforementioned range listed, please consider follow up with your Primary Care Provider.  If you are age 37 or younger, your body mass index should be between 19-25. Your Body mass index is 35.92 kg/m. If this is out of the aformentioned range listed, please consider follow up with your Primary Care Provider.   ________________________________________________________  The [facility] providers would like to encourage you to use MYCHART to communicate with providers for non-urgent requests or questions.  Due to long hold times on the telephone, sending your provider a message by [facility] may be a faster and more efficient way to get a response.  Please allow 48 business hours for a response.  Please remember that this is for non-urgent requests.  _______________________________________________________  [facility] is using a team-based approach to care.  Your team is made up of your doctor and two to three APPS. Our APPS (Nurse Practitioners and Physician Assistants) work with your physician to ensure care continuity for you. They are fully qualified to address your health concerns and develop a treatment plan. They communicate directly with your gastroenterologist to care for you. Seeing the Advanced Practice Practitioners on your physician's team can help you by facilitating care more promptly, often allowing for earlier appointments, access to diagnostic testing, procedures, and other specialty referrals.   Your provider has requested that you go to the basement level for lab work before leaving today. Press B on the  elevator. The lab is located at the first door on the left as you exit the elevator.  -Continue omeprazole  20mg  every day -Continue miralax  17g every day -Continue bentyl  10mg  as needed.  You have been scheduled for a colonoscopy. Please follow written instructions given to you at your visit today.   If you use inhalers (even only as needed), please bring them with you on the day of your procedure.  DO NOT TAKE 7 DAYS PRIOR TO TEST- Trulicity (dulaglutide) Ozempic, Wegovy (semaglutide) Mounjaro (tirzepatide) Bydureon Bcise (exanatide extended release)  DO NOT TAKE 1 DAY PRIOR TO YOUR TEST Rybelsus (semaglutide) Adlyxin (lixisenatide) Victoza (liraglutide) Byetta (exanatide) ___________________________________________________________________________  Due to recent changes in healthcare laws, you may see the results of your imaging and laboratory studies on MyChart before your provider has had a chance to review them.  We understand that in some cases there may be results that are confusing or concerning to you. Not all laboratory results come back in the same time frame and the provider may be waiting for multiple results in order to interpret others.  Please give us  48 hours in order for your provider to thoroughly review all the results before contacting the office for clarification of your results.   Thank you for entrusting me with your care and choosing [facility].  Dr Charlanne

## 2023-12-09 ENCOUNTER — Ambulatory Visit: Payer: Self-pay | Admitting: Gastroenterology

## 2023-12-11 DIAGNOSIS — S0502XA Injury of conjunctiva and corneal abrasion without foreign body, left eye, initial encounter: Secondary | ICD-10-CM | POA: Diagnosis not present

## 2023-12-11 DIAGNOSIS — H16202 Unspecified keratoconjunctivitis, left eye: Secondary | ICD-10-CM | POA: Diagnosis not present

## 2023-12-12 ENCOUNTER — Ambulatory Visit (AMBULATORY_SURGERY_CENTER): Admitting: Gastroenterology

## 2023-12-12 ENCOUNTER — Encounter: Payer: Self-pay | Admitting: Gastroenterology

## 2023-12-12 VITALS — BP 122/77 | HR 63 | Temp 97.8°F | Resp 14 | Ht 64.0 in | Wt 209.4 lb

## 2023-12-12 DIAGNOSIS — K640 First degree hemorrhoids: Secondary | ICD-10-CM

## 2023-12-12 DIAGNOSIS — K5909 Other constipation: Secondary | ICD-10-CM

## 2023-12-12 DIAGNOSIS — K5730 Diverticulosis of large intestine without perforation or abscess without bleeding: Secondary | ICD-10-CM | POA: Diagnosis not present

## 2023-12-12 DIAGNOSIS — D120 Benign neoplasm of cecum: Secondary | ICD-10-CM | POA: Diagnosis not present

## 2023-12-12 MED ORDER — SODIUM CHLORIDE 0.9 % IV SOLN: 500.0000 mL | Freq: Once | INTRAVENOUS | Status: DC

## 2023-12-12 NOTE — Progress Notes (Signed)
 Called to room to assist during endoscopic procedure.  Patient ID and intended procedure confirmed with present staff. Received instructions for my participation in the procedure from the performing physician.

## 2023-12-12 NOTE — Progress Notes (Signed)
 Pt's states no medical or surgical changes since previsit or office visit.  Interpreter used today at the [facility] for this pt.  Interpreter's name is- Alm Kast

## 2023-12-12 NOTE — Op Note (Signed)
 [facility] Patient Name: Patrick Marshall Procedure Date: 12/12/2023 9:31 AM MRN: [id number] Endoscopist: Lynnie Bring , MD, [id number] Age: 78 Referring MD:  Date of Birth: [birth year] Gender: Male Account #: [id number] Procedure:                Colonoscopy Indications:              Generalized abdominal pain Medicines:                Monitored Anesthesia Care Procedure:                Pre-Anesthesia Assessment:                           - Prior to the procedure, a History and Physical                            was performed, and patient medications and                            allergies were reviewed. The patient's tolerance of                            previous anesthesia was also reviewed. The risks                            and benefits of the procedure and the sedation                            options and risks were discussed with the patient.                            All questions were answered, and informed consent                            was obtained. Prior Anticoagulants: The patient has                            taken no anticoagulant or antiplatelet agents. ASA                            Grade Assessment: II - A patient with mild systemic                            disease. After reviewing the risks and benefits,                            the patient was deemed in satisfactory condition to                            undergo the procedure.                           After obtaining informed consent, the colonoscope  was passed under direct vision. Throughout the                            procedure, the patient's blood pressure, pulse, and                            oxygen saturations were monitored continuously. The                            CF [id number] #[id number] was introduced through the anus                            and advanced to the the cecum, identified by                            appendiceal orifice and ileocecal  valve. The                            colonoscopy was performed without difficulty. The                            patient tolerated the procedure well. The quality                            of the bowel preparation was adequate to identify                            polyps. Thereafter, 2 to 3 cm of terminal ileum was                            intubated. The ileocecal valve, TI, appendiceal                            orifice, and rectum were photographed. Scope In: 9:53:54 AM Scope Out: 10:10:09 AM Scope Withdrawal Time: 0 hours 12 minutes 42 seconds  Total Procedure Duration: 0 hours 16 minutes 15 seconds  Findings:                 A 6 mm polyp was found in the cecum. The polyp was                            sessile. The polyp was removed with a cold snare.                            Resection and retrieval were complete.                           A few small-mouthed diverticula were found in the                            sigmoid colon.                           Non-bleeding internal hemorrhoids were  found during                            retroflexion. The hemorrhoids were moderate and                            Grade I (internal hemorrhoids that do not prolapse).                           Retroflexion in the right colon was performed.                           The exam was otherwise without abnormality on                            direct and retroflexion views. Terminal ileum was                            normal. Complications:            No immediate complications. Estimated Blood Loss:     Estimated blood loss: none. Impression:               - One 6 mm polyp in the cecum, removed with a cold                            snare. Resected and retrieved.                           - Mild sigmoid diverticulosis.                           - Non-bleeding internal hemorrhoids.                           - The examination was otherwise normal on direct                            and  retroflexion views. Recommendation:           - Patient has a contact number available for                            emergencies. The signs and symptoms of potential                            delayed complications were discussed with the                            patient. Return to normal activities tomorrow.                            Written discharge instructions were provided to the                            patient.                           -  Resume previous diet.                           - Continue present medications.                           - Await pathology results.                           - Use sugar-free Metamucil one teaspoon PO daily.                           - Repeat colonoscopy is not recommended for                            surveillance.                           - Preparation H cream: Apply externally BID PRN x                            7-10 days.                           - FU if still with problems.                           - The findings and recommendations were discussed                            with the patient's family. Lynnie Bring, MD 12/12/2023 10:16:15 AM This report has been signed electronically.

## 2023-12-12 NOTE — Patient Instructions (Addendum)
 Continue present medications. Await pathology results. Use sugar-free Metamucil one teaspoon PO daily. Repeat colonoscopy is not recommended for surveillance. Preparation H cream: Apply externally BID PRN x 7-10 days. FU if still with problems.  USTED TUVO UN PROCEDIMIENTO ENDOSCPICO HOY EN EL [facility]:   Lea el informe del procedimiento que se le entreg para cualquier pregunta especfica sobre lo que se Dentist.  Si el informe del examen no responde a sus preguntas, por favor llame a su gastroenterlogo para aclararlo.  Si usted solicit que no se le den [employer] de lo que se Clinical cytogeneticist en su procedimiento al [employer] va a cuidar, entonces el informe del procedimiento se ha incluido en un sobre sellado para que usted lo revise despus cuando le sea ms conveniente.   LO QUE PUEDE ESPERAR: Algunas sensaciones de hinchazn en el abdomen.  Puede tener ms gases de lo normal.  El caminar puede ayudarle a eliminar el aire que se le puso en el tracto gastrointestinal durante el procedimiento y reducir la hinchazn.  Si le hicieron una endoscopia inferior (como una colonoscopia o una sigmoidoscopia flexible), podra notar manchas de sangre en las heces fecales o en el papel higinico.  Si se someti a una preparacin intestinal para su procedimiento, es posible que no tenga una evacuacin intestinal normal durante [employer].   Tenga en cuenta:  Es posible que note un poco de irritacin y congestin en la nariz o algn drenaje.  Esto es debido al oxgeno [employer] durante su procedimiento.  No hay que preocuparse y esto debe desaparecer ms o Regulatory affairs officer.   SNTOMAS PARA REPORTAR INMEDIATAMENTE:  Despus de una endoscopia inferior (colonoscopia o sigmoidoscopia flexible):  Cantidades excesivas de sangre en las heces fecales  Sensibilidad significativa o empeoramiento de los dolores abdominales   Hinchazn aguda del abdomen que antes no tena   Fiebre de  100F o ms   Para asuntos urgentes o de Associate Professor, puede comunicarse con un gastroenterlogo a cualquier hora llamando al [phone].  DIETA:  Recomendamos una comida pequea al principio, pero luego puede continuar con su dieta normal.  Tome muchos lquidos, pero debe evitar las bebidas alcohlicas durante 24 horas.    ACTIVIDAD:  Debe planear tomarse las cosas con calma por el resto del da y no debe CONDUCIR ni usar maquinaria pesada Patent examiner (debido a los medicamentos de sedacin utilizados durante el examen).     SEGUIMIENTO: Nuestro personal llamar al nmero que aparece en su historial al siguiente da hbil de su procedimiento para ver cmo se siente y para responder cualquier pregunta o inquietud que pueda tener con respecto a la informacin que se le dio despus del procedimiento. Si no podemos contactarle, le dejaremos un mensaje.  Sin embargo, si se siente bien y no tiene English as a second language teacher, no es necesario que nos devuelva la llamada.  Asumiremos que ha regresado a sus actividades diarias normales sin incidentes. Si se le tomaron algunas biopsias, le contactaremos por telfono o por carta en las prximas 3 semanas.  Si no ha sabido [employer] biopsias en el transcurso de 3 semanas, por favor llmenos al [phone].   FIRMAS/CONFIDENCIALIDAD: Usted y/o el acompaante que le cuide han firmado documentos que se ingresarn en su historial mdico electrnico.  Estas firmas atestiguan el hecho de que la informacin anterior

## 2023-12-12 NOTE — Progress Notes (Signed)
 Sedate, gd SR, tolerated procedure well, VSS, report to RN

## 2023-12-13 ENCOUNTER — Telehealth: Payer: Self-pay | Admitting: *Deleted

## 2023-12-13 NOTE — Telephone Encounter (Signed)
  Follow up Call-     12/12/2023    9:21 AM 09/13/2021    9:28 AM  Call back number  Post procedure Call Back phone  # [name], daughter, [phone], speaks isadora [phone]  Permission to leave phone message Yes Yes     Patient questions:  Do you have a fever, pain , or abdominal swelling? No. Pain Score  0 *  Have you tolerated food without any problems? Yes.    Have you been able to return to your normal activities? Yes.    Do you have any questions about your discharge instructions: Diet   No. Medications  No. Follow up visit  No.  Do you have questions or concerns about your Care? No.  Actions: * If pain score is 4 or above: No action needed, pain <4.

## 2023-12-14 LAB — SURGICAL PATHOLOGY

## 2023-12-15 ENCOUNTER — Ambulatory Visit (HOSPITAL_COMMUNITY): Attending: Orthopedic Surgery

## 2023-12-16 ENCOUNTER — Ambulatory Visit: Payer: Self-pay | Admitting: Gastroenterology

## 2023-12-19 ENCOUNTER — Other Ambulatory Visit: Payer: Self-pay | Admitting: Family Medicine

## 2023-12-19 NOTE — Telephone Encounter (Unsigned)
 Copied from CRM #[id number]. Topic: Clinical - Medication Refill >> Dec 19, 2023 10:52 AM Treva T wrote: Medication: famotidine  (PEPCID ) 40 MG tablet  Has the patient contacted their pharmacy? Yes, advised to contact office   This is the patient's preferred pharmacy:  [id number] - [place], [place] - [address] OF [address] [place] [address] Phone: [phone] Fax: [phone]   Is this the correct pharmacy for this prescription? Yes If no, delete pharmacy and type the correct one.   Has the prescription been filled recently? Yes  Is the patient out of the medication? Yes  Has the patient been seen for an appointment in the last year OR does the patient have an upcoming appointment? Yes  Can we respond through MyChart? No, prefers a phone call at [phone]  Agent: Please be advised that Rx refills may take up to 3 business days. We ask that you follow-up with your pharmacy.

## 2023-12-20 NOTE — Telephone Encounter (Signed)
 Requested medications are due for refill today.  yes  Requested medications are on the active medications list.  yes  Last refill. 09/12/2023 #90 0 rf  Future visit scheduled.   no  Notes to clinic.  Rx signed by Alan Coombs    Requested Prescriptions  Pending Prescriptions Disp Refills   famotidine  (PEPCID ) 40 MG tablet 90 tablet 0    Sig: Take 1 tablet (40 mg total) by mouth at bedtime. Para reflujo     Gastroenterology:  H2 Antagonists Passed - 12/20/2023  3:20 PM      Passed - Valid encounter within last 12 months    Recent Outpatient Visits           1 month ago Controlled type 2 diabetes mellitus without complication, without long-term current use of insulin  [facility])   [place] [facility] Pickard, Butler DASEN, MD   3 months ago Erosive esophagitis   [place] [facility] Duanne Butler DASEN, MD   5 months ago Acute pain of right shoulder   [place] [facility] Duanne Butler DASEN, MD   1 year ago Chest pain, unspecified type   [facility] Duanne Butler DASEN, MD   1 year ago Chronic gastritis without bleeding, unspecified gastritis type   [place] [facility] Pickard, Butler DASEN, MD       Future Appointments             In 4 days Addie, Cordella Hamilton, MD [facility]

## 2023-12-24 ENCOUNTER — Ambulatory Visit: Admitting: Orthopedic Surgery

## 2023-12-24 ENCOUNTER — Encounter: Payer: Self-pay | Admitting: Orthopedic Surgery

## 2023-12-24 DIAGNOSIS — M25511 Pain in right shoulder: Secondary | ICD-10-CM

## 2023-12-24 DIAGNOSIS — M65911 Unspecified synovitis and tenosynovitis, right shoulder: Secondary | ICD-10-CM

## 2023-12-24 NOTE — Progress Notes (Signed)
 Office Visit Note   Patient: Patrick Marshall           Date of Birth: Mar 24, 1946           MRN: [id number] Visit Date: 12/24/2023 Requested by: Duanne Butler DASEN, MD [address] [place],  [place] [address] PCP: Duanne Butler DASEN, MD  Subjective: Chief Complaint  Patient presents with   Right Shoulder - Follow-up    S/p injection on 12/03/23   Neck - Follow-up    Review MRI    HPI: Patrick Marshall is a 78 y.o. male who presents to the office reporting patient presents for follow-up of right shoulder pain.  Had right shoulder injection about 3 weeks ago.  Did help him a lot.  He describes less radicular symptoms now.  Has not had MRI scan on his neck yet..                ROS: All systems reviewed are negative as they relate to the chief complaint within the history of present illness.  Patient denies fevers or chills.  Assessment & Plan: Visit Diagnoses: No diagnosis found.  Plan: Impression is right shoulder pain with improvement in function and pain following intra-articular injection.  I think we could repeat that 1 more time this year if needed.  Would still like for him to get the MRI cervical spine which has been ordered.  Language is a little bit of a barrier in this particular case.  He will come back after he gets the MRI scan on his neck.  Follow-Up Instructions: No follow-ups on file.   Orders:  No orders of the defined types were placed in this encounter.  No orders of the defined types were placed in this encounter.     Procedures: No procedures performed   Clinical Data: No additional findings.  Objective: Vital Signs: There were no vitals taken for this visit.  Physical Exam:  Constitutional: Patient appears well-developed HEENT:  Head: Normocephalic Eyes:EOM are normal Neck: Normal range of motion Cardiovascular: Normal rate Pulmonary/chest: Effort normal Neurologic: Patient is alert Skin: Skin is warm Psychiatric: Patient  has normal mood and affect  Ortho Exam: Ortho exam demonstrates good cervical spine range of motion.  He does have forward flexion and abduction both well above 90 degrees in both shoulders.  Good strength with internal/external rotation as well.  No definite paresthesias C5-T1.  Radial pulse intact bilaterally.  Does have some popping and crepitus more on the right-hand side than the left-hand side.  This is unchanged from prior visit.  Specialty Comments:  No specialty comments available.  Imaging: No results found.   PMFS History: Patient Active Problem List   Diagnosis Date Noted   Esophageal pain 08/24/2023   Esophagitis determined by endoscopy 08/24/2023   Combined forms of age-related cataract of left eye 03/14/2018   Morbid obesity (HCC) 02/28/2018   Class 3 severe obesity with body mass index (BMI) of 40.0 to 44.9 in adult 02/28/2018   Lower urinary tract symptoms (LUTS) 02/28/2018   Dyslipidemia 01/28/2018   SOB (shortness of breath) 01/28/2018   Gout 12/12/2017   Prostate cancer screening 12/12/2017   Hyperlipidemia 03/12/2017   Type 2 diabetes mellitus without complication, without long-term current use of insulin  (HCC) 09/14/2016   Hypertension 09/14/2016   Pterygium eye, 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  GERD (gastroesophageal reflux disease)    Glaucoma    Gout    Heart disease    HLD (hyperlipidemia)    Hypertension     Family History  Problem Relation Age of Onset   Heart disease Mother    Heart failure Father         Heart Surgery  died age 66   Esophageal cancer Neg Hx    Colon cancer Neg Hx    Stomach cancer Neg Hx    Rectal cancer Neg Hx     Past Surgical History:  Procedure Laterality Date   CHOLECYSTECTOMY     ESOPHAGOGASTRODUODENOSCOPY N/A 08/25/2023   Procedure: EGD (ESOPHAGOGASTRODUODENOSCOPY);  Surgeon: Charlanne Groom, MD;  Location:  [facility];  Service: Gastroenterology;  Laterality: N/A;   EYE SURGERY     2016 both eyes   UPPER GI ENDOSCOPY  2023   Social History   Occupational History   Occupation: IT trainer  Tobacco Use   Smoking status: Former    Current packs/day: 0.00    Types: Cigarettes    Start date: 1990    Quit date: 1993    Years since quitting: 32.7   Smokeless tobacco: Never  Vaping Use   Vaping status: Never Used  Substance and Sexual Activity   Alcohol use: Never    Comment: occasional   Drug use: No   Sexual activity: Not on file

## 2024-01-01 ENCOUNTER — Other Ambulatory Visit: Payer: Self-pay | Admitting: Orthopedic Surgery

## 2024-01-02 ENCOUNTER — Other Ambulatory Visit: Payer: Self-pay | Admitting: Family Medicine

## 2024-01-02 ENCOUNTER — Other Ambulatory Visit: Payer: Self-pay | Admitting: Physician Assistant

## 2024-01-02 ENCOUNTER — Ambulatory Visit: Payer: Self-pay | Admitting: *Deleted

## 2024-01-02 NOTE — Telephone Encounter (Unsigned)
 Copied from CRM #[id number]. Topic: Clinical - Prescription Issue >> Jan 02, 2024  3:41 PM Fonda T wrote: Reason for CRM: Received call from daughter of Patrick Marshall, Patrick Marshall, states she called on 12/19/23 to request medicaiton refill for famotidine  (PEPCID ) 40 MG tablet. Per pharamacy states prescription is no longer at pharamacy. Calling to check status of refill, no response has been received regarding refill request.   Patient is now out of medication and needs refilled as soon as possible.   [id number] - [place], [place] - [address] [place] [address] Phone: [phone] Fax: [phone]  Patient daughter can be reached at [phone].

## 2024-01-02 NOTE — Progress Notes (Unsigned)
 01/03/2024 Patrick Marshall [id number] 1945/07/05  Referring provider: Duanne Butler DASEN, MD Primary GI doctor: Dr. Charlanne  *Due to language barrier, an interpreter was present during the history-taking and subsequent discussion (and for part of the physical exam) with this patient.    ASSESSMENT AND PLAN:  Nausea and epigastric pain, decreased appetite, weight loss, SOB, sweating CTAP with contrast 08/09/2023 for epigastric pain shows moderate amount of residual fecal matter throughout the colon no obstruction, enlarged prostate, otherwise unremarkable.  No bowel inflammation, liver unremarkable pancreas normal spleen normal prior cholecystectomy EGD 08/25/2023 mild gastritis otherwise unremarkable EGD, negative celiac negative H. pylori no metaplasia/dysplasia reflux esophagitis negative EOE or dysplasia 10/01/2023 GES negative He was on dexilant  but this was too expensive On mobic  at this time 7.5 mg once a day likely worsening symptoms - has some sweating, SOB, chest pain and pain in left shoulder with the epigastric pain, has had relatively normal EGD, CT scan, colonoscopy - stop mobic  - add on omeprazole  40 mg BID - continue remeron  at night  suggest following up with cardiology, has appointment at 3 today - trial of flagyl  for possible SIBO  IBS constipation/diarrhea Constipation seen on CT 08/09/2023 - consider linzess  Nonexertional chest pain/DOE CT coronary in 2021 with Ca score 325 (51st percentile), 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 Echo 12/26/2022 EF 55 to 60% thought to be GERD has some sweating, SOB, chest pain and pain in left shoulder with the epigastric pain, has had relatively normal EGD, CT scan, colonoscopy, suggest follow up with cardiology  Type 2 diabetes Lab Results  Component Value Date   HGBA1C 7.1 (H) 11/01/2023   Personal history of colon polyps 12/12/2023 colonoscopy adequate prep to identify polyps only 6 mm polyp, diverticula nonbleeding internal  hemorrhoids no recall due to age.  Patient Care Team: Duanne Butler DASEN, MD as PCP - General (Family Medicine) Lonni Slain, MD as PCP - Cardiology (Cardiology)  HISTORY OF PRESENT ILLNESS: 78 y.o. male with a past medical history of  hyperlipidemia, palpitations, chronic SOB, diabetes mellitus type 2, gout and GERD.  Past cholecystectomy, HTN and others listed below presents for evaluation of epigastric pain and nausea.  I last saw the patient in the office 08/2023 for epigastric pain and nausea. Had follow-up visit most recently with Dr. Charlanne December 07, 2023  Discussed the use of AI scribe software for clinical note transcription with the patient, who gave verbal consent to proceed.  History of Present Illness   Patrick Marshall is a 78 year old male who presents with severe burning pain in the stomach and chest.  He experiences a severe burning sensation in the stomach and chest that persists throughout the day and night, preventing sleep and causing a sensation of choking. The pain has intensified over the past week, although milder symptoms have occurred in the past. He describes the pain as originating from the 'mouth of the stomach', spreading upwards and sometimes affecting his head and heart.  He has a history of endoscopy in May which showed mild inflammation, and a CT scan that was normal except for stool presence. A study on stomach motility was also negative. He has previously tried Dexilant  60 mg, but found it too strong and felt it caused more burning, and he is not currently taking any medication specifically for reflux. He also takes Mobic  for muscle and joint pain.  He experiences nausea, sweating, and a sensation of heat in his body at night. No  issues with food or pills getting stuck in his throat. Even drinking water can cause pain, and the pain is not related to physical exertion. He describes a sensation of 'acids' rising up, causing discomfort in his  chest and left shoulder, although the shoulder pain has been present for a long time.  He mentions that the pain feels like a spasm in the stomach and worsened after taking a colonoscopy prep solution, which he believes damaged his stomach. He has not been taking any medication specifically for reflux recently, as he feels the current medications are not effective.  He denies alcohol consumption and primarily drinks water, although he notes that even water can sometimes exacerbate his symptoms.        He  reports that he quit smoking about 32 years ago. His smoking use included cigarettes. He started smoking about 35 years ago. He has never used smokeless tobacco. He reports that he does not drink alcohol and does not use drugs.  Wt Readings from Last 10 Encounters:  01/03/24 208 lb (94.3 kg)  12/12/23 209 lb 6.4 oz (95 kg)  12/07/23 209 lb 4 oz (94.9 kg)  11/01/23 207 lb 9.6 oz (94.2 kg)  10/25/23 209 lb 2 oz (94.9 kg)  09/12/23 205 lb 9.6 oz (93.3 kg)  08/27/23 211 lb (95.7 kg)  08/09/23 220 lb (99.8 kg)  07/16/23 224 lb 9.6 oz (101.9 kg)  12/08/22 223 lb 1.6 oz (101.2 kg)    RELEVANT GI HISTORY, IMAGING AND LABS: Results   RADIOLOGY Abdominal CT: Normal liver and pancreas, presence of stool (07/2023)  DIAGNOSTIC Endoscopy: Mild inflammation (07/2023) Gastric motility study: Normal (07/2023)      09/12/2023 abdominal x-ray 1 view IMPRESSION: Normal bowel gas pattern. Small volume of formed stool in the colon.  10/01/2023 gastric emptying study Normal gastric emptying study   Abdominal CT: Significant stool burden, otherwise unremarkable (07/2023) DIAGNOSTIC Endoscopy: Revealed inflammation, no malignancy or other significant findings   EGD 09/13/2021: - Mild esophagitis but no other endoscopic esophageal abnormality to explain patient's dysphagia. Esophagus dilated. Dilated. Biopsied. - Gastritis. Biopsied. - Erythematous duodenopathy. Biopsied. 1. Surgical [P],  duodenal FRAGMENTS OF DUODENAL MUCOSA WITH NORMAL VILLOUS AND CRYPT ARCHITECTURE. MILD INFLAMMATION IS SEEN BUT NO INCREASE IN INTRAEPITHELIAL LYMPHOCYTES IS NOTED. NO DYSPLASIA OR MALIGNANCY IS SEEN. NO HISTOLOGIC EVIDENCE OF CELIAC SPRUE IS SEEN. 2. Surgical [P], gastritis MILD CHRONIC GASTRITIS. NO SIGNIFICANT ACTIVITY, DYSPLASIA OR MALIGNANCY IS SEEN ALCIAN BLUE STAIN WITH APPROPRIATE CONTROLS IS NEGATIVE FOR INTESTINAL METAPLASIA. IMMUNOHISTOCHEMISTRY FOR H. PYLORI IS NEGATIVE FOR H PYLORI-LIKE ORGANISMS. 3. Surgical [P], mid esophagus and proximal esophagus FRAGMENTS OF INFLAMED SQUAMOUS AND GLANDULAR MUCOSA WITH RARE CELL SHOWING INTESTINAL METAPLASIA CONFIRMED BY ALCIAN BLUE STAIN WITH APPROPRIATE CONTROLS. NO SIGNIFICANT NUMBER OF EOSINOPHILS ARE SEEN. NO DYSPLASIA OR MALIGNANCY IS SEEN. CLINICAL AND ENDOSCOPIC CORELATION IS REQUIRED. 4. Surgical [P], distal esophagus FRAGMENTS OF SQUAMOUS MUCOSA SHOWING MILD INFLAMMATION.. NO SIGNIFICANT NUMBER OF EOSINOPHILS, DYSPLASIA OR MALIGNANCY IS SEEN. CLINICAL AND ENDOSCOPIC CORRELATION IS REQUIRED.    RUQ sono 03/16/2021: 1. 1.6 cm hypoechoic indeterminate right hepatic lesion. Recommend further evaluation with MRI. 2. Heterogeneous liver echotexture may be related to diffuse hepatocellular disease. Please correlate clinically. 3. Cholecystectomy.   Abdominal MRI/ 04/13/2021: 1. 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 This lesion is benign and requires no further workup. CBC    Component Value Date/Time   WBC 9.0 12/07/2023 1454   RBC 4.74 12/07/2023 1454   HGB 15.4 12/07/2023 1454  HCT 45.8 12/07/2023 1454   PLT 233.0 12/07/2023 1454   MCV 96.6 12/07/2023 1454   MCH 31.9 11/01/2023 1023   MCHC 33.5 12/07/2023 1454   RDW 13.6 12/07/2023 1454   LYMPHSABS 1.2 09/12/2023 1051   MONOABS 0.4 09/12/2023 1051   EOSABS 170 11/01/2023 1023   BASOSABS 50 11/01/2023 1023   Recent Labs    08/09/23 0635 08/24/23 0642  08/24/23 1229 08/25/23 0523 09/12/23 1051 11/01/23 1023 12/07/23 1454  HGB 17.2* 16.2 15.3 16.3 16.4 16.0 15.4    CMP     Component Value Date/Time   NA 140 12/07/2023 1454   K 4.0 12/07/2023 1454   CL 103 12/07/2023 1454   CO2 30 12/07/2023 1454   GLUCOSE 116 (H) 12/07/2023 1454   BUN 20 12/07/2023 1454   CREATININE 1.14 12/07/2023 1454   CREATININE 1.25 11/01/2023 1023   CALCIUM  9.1 12/07/2023 1454   PROT 6.6 12/07/2023 1454   ALBUMIN 4.2 12/07/2023 1454   AST 28 12/07/2023 1454   ALT 43 12/07/2023 1454   ALKPHOS 73 12/07/2023 1454   BILITOT 0.4 12/07/2023 1454   GFRNONAA >60 08/25/2023 0523   GFRNONAA 61 08/27/2020 1546   GFRAA 71 08/27/2020 1546      Latest Ref Rng & Units 12/07/2023    2:54 PM 11/01/2023   10:23 AM 09/12/2023   10:51 AM  Hepatic Function  Total Protein 6.0 - 8.3 g/dL 6.6  6.5  7.3   Albumin 3.5 - 5.2 g/dL 4.2   4.4   AST 0 - 37 U/L 28  15  24    ALT 0 - 53 U/L 43  16  50   Alk Phosphatase 39 - 117 U/L 73   63   Total Bilirubin 0.2 - 1.2 mg/dL 0.4  0.7  0.8       Current Medications:   Current Outpatient Medications (Endocrine & Metabolic):    metFORMIN  (GLUCOPHAGE ) 500 MG tablet, Take 1 tablet (500 mg total) by mouth 2 (two) times daily with a meal.  Current Outpatient Medications (Cardiovascular):    nitroGLYCERIN  (NITROSTAT ) 0.4 MG SL tablet, DISSOLVE ONE TABLET UNDER THE TONGUE EVERY 5 MINUTES AS NEEDED FOR CHEST PAIN.  DO NOT EXCEED A TOTAL OF 3 DOSES IN 15 MINUTES (Patient not taking: Reported on 01/03/2024)   Current Outpatient Medications (Analgesics):    meloxicam  (MOBIC ) 7.5 MG tablet, TAKE 1 TABLET BY MOUTH EVERY DAY FOR 21 DAYS THEN AS NEEDED (Patient not taking: Reported on 01/03/2024)   Current Outpatient Medications (Other):    metroNIDAZOLE  (FLAGYL ) 250 MG tablet, Take 1 tablet (250 mg total) by mouth 3 (three) times daily for 10 days.   omeprazole  (PRILOSEC) 40 MG capsule, Take 1 capsule (40 mg total) by mouth 2 (two) times  daily before a meal.   dicyclomine  (BENTYL ) 20 MG tablet, TAKE 1 TABLET BY MOUTH 3 TIMES A DAY AS NEEDED FOR SPASMS (Patient not taking: Reported on 01/03/2024)   mirtazapine  (REMERON ) 15 MG tablet, Take 1 tablet (15 mg total) by mouth at bedtime. (Patient not taking: Reported on 01/03/2024)   neomycin-polymyxin b-dexamethasone (MAXITROL) 3.5-10000-0.1 SUSP, SMARTSIG:In Eye(s) (Patient not taking: Reported on 01/03/2024)  Medical History:  Past Medical History:  Diagnosis Date   Cataract    Diabetes mellitus without complication (HCC)    x few years.   GERD (gastroesophageal reflux disease)    Glaucoma    Gout    Heart disease    HLD (hyperlipidemia)    Hypertension  Allergies:  Allergies  Allergen Reactions   Penicillins Other (See Comments)    Difficulty sleeping     Surgical History:  He  has a past surgical history that includes Cholecystectomy; Eye surgery; Upper gi endoscopy (2023); and Esophagogastroduodenoscopy (N/A, 08/25/2023). Family History:  His family history includes Heart disease in his mother; Heart failure in his father.  REVIEW OF SYSTEMS  : All other systems reviewed and negative except where noted in the History of Present Illness.  PHYSICAL EXAM: BP 124/82   Pulse 84   Ht 5' (1.524 m)   Wt 208 lb (94.3 kg)   BMI 40.62 kg/m  Physical Exam   GENERAL APPEARANCE: Well nourished, in no apparent distress HEENT: No cervical lymphadenopathy, unremarkable thyroid , sclerae anicteric, conjunctiva pink RESPIRATORY: Respiratory effort normal, BS equal bilateral without rales, rhonchi, wheezing CARDIO: RRR with no MRGs, peripheral pulses intact ABDOMEN: Soft, non distended, active bowel sounds in all 4 quadrants, no tenderness to palpation, no rebound, no mass appreciated RECTAL: declines MUSCULOSKELETAL: Full ROM, normal gait, without edema SKIN: Dry, intact without rashes or lesions. No jaundice. NEURO: Alert, oriented, no focal deficits PSYCH: Cooperative,  normal mood and affect.      Alan JONELLE Coombs, PA-C 10:50 AM

## 2024-01-02 NOTE — Telephone Encounter (Signed)
 FYI Only or Action Required?: FYI only for provider.  Patient was last seen in primary care on 11/01/2023 by Patrick Butler DASEN, MD.  Called Nurse Triage reporting Chest Pain.  Symptoms began several weeks ago.  Interventions attempted: Other: eating less .  Symptoms are: gradually worsening.  Triage Disposition: Call PCP Within 24 Hours  Patient/caregiver understands and will follow disposition?: Yes    Patient daughter reports patient has difficulty breathing when laying flat.           Copied from CRM [id number]. Topic: Clinical - Red Word Triage >> Jan 02, 2024  3:46 PM Fonda T wrote: Red Word that prompted transfer to Nurse Triage: Received call from daughter of patient, Patrick Marshall, states Patient is having chest pain and discomfort, with difficulty breathing due to being without medication, and stomach issues, famotidine  (PEPCID ) 40 MG tablet . Reason for Disposition  [1] Patient says chest pain feels exactly the same as previously diagnosed heartburn AND [2] describes burning in chest AND [3] accompanying sour taste in mouth  Answer Assessment - Initial Assessment Questions Appt scheduled tomorrow. Recommended if worsening sx go to ED. Caller patient's daughter on [place]. Patient currently at work. Reports out of pepcid . Appt scheduled with GI tomorrow. Patient daughter requesting metformin  and other medications to be refilled early due to patient will be going out of [place] 22 . Please advise.      1. LOCATION: Where does it hurt?       Under chest pit of stomach per daughter  2. RADIATION: Does the pain go anywhere else? (e.g., into neck, jaw, arms, back)     Up to chest to throat  3. ONSET: When did the chest pain begin? (Minutes, hours or days)      Longer than 2 weeks  4. PATTERN: Does the pain come and go, or has it been constant since it started?  Does it get worse with exertion?      Comes and goes  5. DURATION: How long does it last (e.g.,  seconds, minutes, hours)     Not sure  6. SEVERITY: How bad is the pain?  (e.g., Scale 1-10; mild, moderate, or severe)     Not sure , feels pain worse some days than others  7. CARDIAC RISK FACTORS: Do you have any history of heart problems or risk factors for heart disease? (e.g., angina, prior heart attack; diabetes, high blood pressure, high cholesterol, smoker, or strong family history of heart disease)     DM  8. PULMONARY RISK FACTORS: Do you have any history of lung disease?  (e.g., blood clots in lung, asthma, emphysema, birth control pills)     na 9. CAUSE: What do you think is causing the chest pain?     Out of medication pepcid  10. OTHER SYMPTOMS: Do you have any other symptoms? (e.g., dizziness, nausea, vomiting, sweating, fever, difficulty breathing, cough)       Low chest discomfort, poor po intake due to afraid of worsening pain.  11. PREGNANCY: Is there any chance you are pregnant? When was your last menstrual period?       na  Protocols used: Chest Pain-A-AH

## 2024-01-03 ENCOUNTER — Encounter: Payer: Self-pay | Admitting: Family Medicine

## 2024-01-03 ENCOUNTER — Encounter: Payer: Self-pay | Admitting: Physician Assistant

## 2024-01-03 ENCOUNTER — Ambulatory Visit: Admitting: Physician Assistant

## 2024-01-03 ENCOUNTER — Ambulatory Visit (INDEPENDENT_AMBULATORY_CARE_PROVIDER_SITE_OTHER): Admitting: Family Medicine

## 2024-01-03 VITALS — BP 124/82 | HR 84 | Ht 60.0 in | Wt 208.0 lb

## 2024-01-03 VITALS — BP 130/70 | HR 67 | Temp 97.9°F | Ht 60.0 in | Wt 208.0 lb

## 2024-01-03 DIAGNOSIS — R0602 Shortness of breath: Secondary | ICD-10-CM

## 2024-01-03 DIAGNOSIS — R0789 Other chest pain: Secondary | ICD-10-CM

## 2024-01-03 DIAGNOSIS — R110 Nausea: Secondary | ICD-10-CM | POA: Diagnosis not present

## 2024-01-03 DIAGNOSIS — K5904 Chronic idiopathic constipation: Secondary | ICD-10-CM

## 2024-01-03 DIAGNOSIS — Z7984 Long term (current) use of oral hypoglycemic drugs: Secondary | ICD-10-CM

## 2024-01-03 DIAGNOSIS — K2950 Unspecified chronic gastritis without bleeding: Secondary | ICD-10-CM | POA: Diagnosis not present

## 2024-01-03 DIAGNOSIS — R634 Abnormal weight loss: Secondary | ICD-10-CM | POA: Diagnosis not present

## 2024-01-03 DIAGNOSIS — K2100 Gastro-esophageal reflux disease with esophagitis, without bleeding: Secondary | ICD-10-CM

## 2024-01-03 DIAGNOSIS — K219 Gastro-esophageal reflux disease without esophagitis: Secondary | ICD-10-CM | POA: Diagnosis not present

## 2024-01-03 DIAGNOSIS — E119 Type 2 diabetes mellitus without complications: Secondary | ICD-10-CM | POA: Diagnosis not present

## 2024-01-03 DIAGNOSIS — K582 Mixed irritable bowel syndrome: Secondary | ICD-10-CM

## 2024-01-03 DIAGNOSIS — R1013 Epigastric pain: Secondary | ICD-10-CM | POA: Diagnosis not present

## 2024-01-03 DIAGNOSIS — R079 Chest pain, unspecified: Secondary | ICD-10-CM

## 2024-01-03 MED ORDER — METRONIDAZOLE 250 MG PO TABS: 250.0000 mg | ORAL_TABLET | Freq: Three times a day (TID) | ORAL | 0 refills | Status: AC

## 2024-01-03 MED ORDER — METFORMIN HCL 500 MG PO TABS: 500.0000 mg | ORAL_TABLET | Freq: Two times a day (BID) | ORAL | 3 refills | Status: AC

## 2024-01-03 MED ORDER — OMEPRAZOLE 40 MG PO CPDR: 40.0000 mg | DELAYED_RELEASE_CAPSULE | Freq: Two times a day (BID) | ORAL | 3 refills | Status: AC

## 2024-01-03 NOTE — Progress Notes (Signed)
 Subjective:    Patient ID: Patrick Marshall, male    DOB: Oct 03, 1945, 78 y.o.   MRN: [id number] 08/14/22 Patient has been dealing with chronic abdominal pain for quite some time.  Please see previous office notes.  Last [place]GD in [place]  was 6/23 which showed gastritis. He was lost to follow-up as he went back to [place]  [place] he had endoscopy due to his chronic abdominal pain and he was diagnosed with a hiatal hernia per his report.  He was also told that he had an ulcer in the antrum of the stomach and in the duodenal bulb along with chronic gastritis and erosive esophagitis.  He was placed on Nexium  twice daily as well as cinitaprida which is available only in [place].  He is here today requesting refills  11/03/22 Patient recently went to the emergency room and [facility].  He states for 3 weeks he has been feeling bad.  He reports pain underneath his xiphoid process.  The pain radiated up into his chest.  It hurts to lay down.  He reported nausea.  He reported a burning pain radiating up his throat.  He has stopped all of his medication.  The only medicine he was taking his aspirin .  Per his report, they did a CT scan in the emergency room that was negative.  They started him on omeprazole  40 mg daily.  At that time, my plan was: I believe the patient has chronic gastritis and his symptoms likely return because he stopped his medication.  Therefore I recommended that he continue omeprazole  40 mg daily for 3 months that he add Pepcid  40 mg daily and use Zofran  4 mg every 8 hours as needed for nausea.  Recheck if no better in 2 weeks or sooner if worse  11/07/22 Patient stopped taking his omeprazole  completely and was only taking famotidine .  He presents today with a burning pain in his epigastric area radiating up to his throat EKG shows normal sinus rhythm with normal intervals with no.  He denies any trouble breathing.  Patient rated his pain at a 10.  I gave him a GI  cocktail including viscous lidocaine  and his pain improved to a 3 within 5 minutes.  He denies any angina or shortness of breath.  He states that he has a difficult time eating.  It hurts to even drink water.  08/27/23 Since I last saw the patient, he apparently stopped all of his medications.  He states that the pantoprazole  medicine never helped him.  That is why he stopped it.  He was admitted to the hospital and had an EGD performed due to epigastric pain and esophageal pain.  EGD showed mild esophagitis.  Biopsies are pending.  He was started on pantoprazole  twice a day and then a GI cocktail 15 mL 3 times a day as needed.  He has not been taking the pantoprazole  since discharge from hospital because he states the medicine does not help.  He has been taking the GI cocktail.  Patient has also tried and failed omeprazole  and famotidine   11/01/23 Recently received a note from the patient's GI doctor.  They treated him with Flagyl  for possible SIBO the patient abdominal improved.  He stated in his visit that he was pain-free.  Therefore they released the patient.  They maintain him on Dexilant  and Pepcid .  He presents today and reports 1 week of severe abdominal pain similar to what he was  having before.  He states that the pain is no better.  He continues to report nausea and bloating.  He is on Dexilant  and Pepcid .  He states the symptoms are no better than they were before.  Patient also complains of pain in his right shoulder.  I have previously given him a cortisone injection in the shoulder and the pain improved for approximately 2 months but has returned.  He reports pain with overhead activity and abduction greater than 90 degrees.  I recommended physical therapy but he states he tried physical therapy in [place] and they told him he had a tear.  He would like to see an orthopedist for a second opinion.  He is also here for follow-up of his diabetes.  He is not taking metformin .  He has been off  metformin  now for almost 10 months.  He is not checking his sugars.  01/03/24, Patient saw gastroenterology this morning.  He is currently taking Flagyl  along with omeprazole .  He has been complaining of epigastric abdominal pain for years.  However he is now complaining of pain in the center of his chest.  He had a CT scan of the heart in 2021 that suggested FINDINGS: Coronary calcium  score: The patient's coronary artery calcium  score is 325, which places the patient in the 51st percentile.   Coronary arteries: Normal coronary origins.  Right dominance.   Right Coronary Artery: Large caliber vessel, gives rise to PDA. Scattered mild mixed calcified and noncalcified plaque with maximum 1-24% stenosis.   Left Main Coronary Artery: Normal caliber vessel. Noncalcified plaque in ostium of LM, small amount of mixed calcified and noncalcified plaque in mid to distal, maximum stenosis 1-24%.   Left Anterior Descending Coronary Artery: Normal caliber vessel. There is diffuse significant mixed calcified and noncalcified plaque throughout the proximal to mid LAD. There are several areas of 25-49% stenosis, and due to the quality of the study, higher grades of stenosis cannot be excluded. Gives rise to 2 small diagonal branches.   Left Circumflex Artery: Normal caliber vessel. Mixed calcified and noncalcified plaque in proximal to mid LCx, difficult to fully visualize based on image quality but appears consistent with 1-24% stenosis. Gives rise to large OM1 branch.   Therefore it was felt that he had no significant flow-limiting lesions.  He states that he has daily pain.  Will get him in the sternum area or just below the sternum.  Will radiate up into his neck.  Sometimes it is associated with shortness of breath.  However the pain has been going on for years.  The symptoms are unrelieved by any of the medications he has been given by his gastroenterologist.  Gastroenterology suggested that he  see his cardiologist.  His GI workup has included: CTAP with contrast 08/09/2023 for epigastric pain shows moderate amount of residual fecal matter throughout the colon no obstruction, enlarged prostate, otherwise unremarkable.  No bowel inflammation, liver unremarkable pancreas normal spleen normal prior cholecystectomy EGD 08/25/2023 mild gastritis otherwise unremarkable EGD, negative celiac negative H. pylori no metaplasia/dysplasia reflux esophagitis negative EOE or dysplasia 10/01/2023 GES negative He was on dexilant  but this was too expensive  They are even empirically trying him on Flagyl  for possible small intestinal bacterial overgrowth.  However he complains of severe pain again. Past Medical History:  Diagnosis Date   Cataract    Diabetes mellitus without complication (HCC)    x few years.   GERD (gastroesophageal reflux disease)    Glaucoma  Gout    Heart disease    HLD (hyperlipidemia)    Hypertension    Past Surgical History:  Procedure Laterality Date   CHOLECYSTECTOMY     ESOPHAGOGASTRODUODENOSCOPY N/A 08/25/2023   Procedure: EGD (ESOPHAGOGASTRODUODENOSCOPY);  Surgeon: Charlanne Groom, MD;  Location: [facility];  Service: Gastroenterology;  Laterality: N/A;   EYE SURGERY     2016 both eyes   UPPER GI ENDOSCOPY  2023   Current Outpatient Medications on File Prior to Visit  Medication Sig Dispense Refill   metroNIDAZOLE  (FLAGYL ) 250 MG tablet Take 1 tablet (250 mg total) by mouth 3 (three) times daily for 10 days. 30 tablet 0   omeprazole  (PRILOSEC) 40 MG capsule Take 1 capsule (40 mg total) by mouth 2 (two) times daily before a meal. 180 capsule 3   dicyclomine  (BENTYL ) 20 MG tablet TAKE 1 TABLET BY MOUTH 3 TIMES A DAY AS NEEDED FOR SPASMS (Patient not taking: Reported on 01/03/2024) 50 tablet 0   meloxicam  (MOBIC ) 7.5 MG tablet TAKE 1 TABLET BY MOUTH EVERY DAY FOR 21 DAYS THEN AS NEEDED (Patient not taking: Reported on 01/03/2024) 30 tablet 0   mirtazapine  (REMERON ) 15 MG  tablet Take 1 tablet (15 mg total) by mouth at bedtime. (Patient not taking: Reported on 01/03/2024) 30 tablet 0   neomycin-polymyxin b-dexamethasone (MAXITROL) 3.5-10000-0.1 SUSP SMARTSIG:In Eye(s) (Patient not taking: Reported on 01/03/2024)     nitroGLYCERIN  (NITROSTAT ) 0.4 MG SL tablet DISSOLVE ONE TABLET UNDER THE TONGUE EVERY 5 MINUTES AS NEEDED FOR CHEST PAIN.  DO NOT EXCEED A TOTAL OF 3 DOSES IN 15 MINUTES (Patient not taking: Reported on 01/03/2024) 50 tablet 0   No current facility-administered medications on file prior to visit.   Allergies  Allergen Reactions   Penicillins Other (See Comments)    Difficulty sleeping   Social History   Socioeconomic History   Marital status: Married    Spouse name: Not on file   Number of children: 5   Years of education: Not on file   Highest education level: Not on file  Occupational History   Occupation: IT trainer  Tobacco Use   Smoking status: Former    Current packs/day: 0.00    Types: Cigarettes    Start date: 1990    Quit date: 1993    Years since quitting: 32.7   Smokeless tobacco: Never  Vaping Use   Vaping status: Never Used  Substance and Sexual Activity   Alcohol use: Never    Comment: occasional   Drug use: No   Sexual activity: Not on file  Other Topics Concern   Not on file  Social History Narrative   Not on file   Social Drivers of Health   Financial Resource Strain: Not on file  Food Insecurity: No Food Insecurity (08/24/2023)   Hunger Vital Sign    Worried About Running Out of Food in the Last Year: Never true    Ran Out of Food in the Last Year: Never true  Transportation Needs: No Transportation Needs (08/24/2023)   PRAPARE - Administrator, Civil Service (Medical): No    Lack of Transportation (Non-Medical): No  Physical Activity: Not on file  Stress: Not on file  Social Connections: Socially Isolated (08/24/2023)   Social Connection and Isolation Panel    Frequency of Communication with  Friends and Family: More than three times a week    Frequency of Social Gatherings with Friends and Family: Twice a week    Attends  Religious Services: Never    Active Member of Clubs or Organizations: No    Attends Banker Meetings: Never    Marital Status: Separated  Intimate Partner Violence: Not At Risk (08/24/2023)   Humiliation, Afraid, Rape, and Kick questionnaire    Fear of Current or Ex-Partner: No    Emotionally Abused: No    Physically Abused: No    Sexually Abused: No      Review of Systems  Gastrointestinal:  Positive for abdominal pain.  All other systems reviewed and are negative.      Objective:   Physical Exam Vitals reviewed.  Constitutional:      General: He is not in acute distress.    Appearance: Normal appearance. He is obese. He is not ill-appearing or toxic-appearing.  HENT:     Right Ear: Hearing, tympanic membrane and ear canal normal. There is no impacted cerumen.     Left Ear: Tympanic membrane and ear canal normal. Decreased hearing noted. There is no impacted cerumen.  Neck:     Vascular: No carotid bruit.  Cardiovascular:     Rate and Rhythm: Normal rate and regular rhythm.     Pulses: Normal pulses.     Heart sounds: Normal heart sounds. No murmur heard.    No friction rub. No gallop.  Pulmonary:     Effort: Pulmonary effort is normal. No respiratory distress.     Breath sounds: Normal breath sounds. No stridor. No wheezing, rhonchi or rales.  Chest:     Chest wall: No tenderness.  Abdominal:     General: Abdomen is flat. Bowel sounds are normal. There is no distension.     Palpations: Abdomen is soft.     Tenderness: There is no abdominal tenderness. There is no guarding.  Musculoskeletal:     Right lower leg: No edema.     Left lower leg: No edema.  Neurological:     Mental Status: He is alert.           Assessment & Plan:  Chest pain, unspecified type  Controlled type 2 diabetes mellitus without complication,  without long-term current use of insulin  (HCC) - Plan: metFORMIN  (GLUCOPHAGE ) 500 MG tablet  Chronic gastritis without bleeding, unspecified gastritis type  Gastroesophageal reflux disease with esophagitis without hemorrhage I believe the patient's pain is likely GI related.  Symptoms seem inconsistent with coronary artery disease.  I recommended trying Voquenza 10 mg daily to see if this will help with his GI symptoms.  I will arrange follow-up with his cardiologist as his last CT scan was almost 5 years ago.

## 2024-01-03 NOTE — Patient Instructions (Addendum)
 Tome su inhibidor de la bomba de protones, omerapzol 40 mg dos veses por dia. Tmelo de 30 minutos a 1 hora antes de las comidas para que sea ms efectivo. Agregue Pepcid  por la noche. Suspenda la crcuma. Evite las comidas picantes y cidas. Evite las comidas grasas. Limite el consumo de caf, t, alcohol y bebidas carbonatadas. Esfurcese por mantener un peso saludable. Mantenga la cabecera de la cama elevada al menos 7,5 cm con bloques o una almohada de cua si presenta algn sntoma nocturno. Permanezca erguido durante 2 horas despus de comer. Evite las comidas y refrigerios de 3 a 4 horas antes de [place].  Paga el mobic    I appreciate the  opportunity to care for you  Thank You   [facility]

## 2024-01-04 NOTE — Telephone Encounter (Signed)
 Discontinued on 01/02/24.  Requested Prescriptions  Pending Prescriptions Disp Refills   famotidine  (PEPCID ) 40 MG tablet 90 tablet 0    Sig: Take 1 tablet (40 mg total) by mouth at bedtime. Para reflujo     Gastroenterology:  H2 Antagonists Passed - 01/04/2024 12:51 PM      Passed - Valid encounter within last 12 months    Recent Outpatient Visits           Yesterday Chest pain, unspecified type   [place] [facility] Duanne Butler DASEN, MD   2 months ago Controlled type 2 diabetes mellitus without complication, without long-term current use of insulin  [facility])   [place] [facility] Duanne Butler DASEN, MD   4 months ago Erosive esophagitis   [place] [facility] Duanne Butler DASEN, MD   5 months ago Acute pain of right shoulder   [place] [facility] Duanne Butler DASEN, MD   1 year ago Chest pain, unspecified type   [place] [facility] [place], Butler DASEN, MD

## 2024-01-08 ENCOUNTER — Telehealth: Payer: Self-pay | Admitting: Physician Assistant

## 2024-01-08 NOTE — Telephone Encounter (Signed)
 Inbound call from patients daughter stating that patients pain has not subsided and the medication is not working. She is requesting a call back to discuss. Please advise.

## 2024-01-08 NOTE — Telephone Encounter (Signed)
 Spoke with Pt daughter [name] Recent recommendations were reviewed with the pt daughter from last office visit.  - stop mobic  - add on omeprazole  40 mg BID - continue remeron  at night  suggest following up with cardiology, has appointment at 3 today - trial of flagyl  for possible SIBO  Daughter to confirm that pt is following recommendations and then call us  back.   [name]  verbalized understanding with all questions answered.

## 2024-01-10 ENCOUNTER — Other Ambulatory Visit: Payer: Self-pay

## 2024-01-10 DIAGNOSIS — R634 Abnormal weight loss: Secondary | ICD-10-CM

## 2024-01-10 DIAGNOSIS — R1013 Epigastric pain: Secondary | ICD-10-CM

## 2024-01-10 NOTE — Telephone Encounter (Signed)
 Patient daughter requesting f/u call at [phone] in regards to previous note. Please advise.

## 2024-01-10 NOTE — Telephone Encounter (Signed)
 Spoke with patient's daughter Patrick Marshall & she says patient is still having constant, upper abdominal pain (worse with meals/drinking). He's taking all medication as recommended, except for dicyclomine  since he is out of refills. It was providing only a little relief before. BM's regular. He feels that he has no quality of life with this pain & daughter is calling to see if there is anything else he can do. Last seen with Alan, PA on 01/03/24.

## 2024-01-10 NOTE — Telephone Encounter (Signed)
 Daughter Zoila made aware of PA recommendations. CT ordered & schedulers notified. She has been provided radiology scheduling number & advised if she has not heard from them by next week to please call. She's also aware that if Dr. Charlanne has any other recommendations we will be in touch. She verbalized all understanding.

## 2024-01-11 NOTE — Telephone Encounter (Signed)
 Agree with CTA  RG

## 2024-01-17 NOTE — Telephone Encounter (Signed)
 Left message for patient's daughter Zoila to call back.

## 2024-01-17 NOTE — Telephone Encounter (Signed)
 Spoke with patient's daughter Zoila & have provided her radiology scheduling number and advised she call when patient returns to the country in January.

## 2024-01-17 NOTE — Telephone Encounter (Signed)
-----   Message -----  From: Valdemar Orlene MATSU  Sent: 01/16/2024  11:20 AM EDT  To: April H Pait   10/22 per pt's dtr-pt is out of town for 2 months due to work schedule- would prefer to call us  back when pt is back home [place]  ----- Message -----  From: Teodoro, April H  Sent: 01/10/2024  11:43 AM EDT  To: Orlene MATSU Valdemar    ----- Message -----  From: Marc Nyla LABOR, RN  Sent: 01/10/2024  11:30 AM EDT  To: April H Pait; Andree CHRISTELLA Ang   Hi, please schedule patient for CT angio. Order is in. Thank you!

## 2024-01-28 ENCOUNTER — Encounter: Payer: Self-pay | Admitting: Radiology

## 2024-01-30 ENCOUNTER — Other Ambulatory Visit: Payer: Self-pay | Admitting: Orthopedic Surgery

## 2024-03-24 ENCOUNTER — Ambulatory Visit: Admitting: Physician Assistant

## 2024-03-24 NOTE — Progress Notes (Deleted)
 "    03/24/2024 Patrick Marshall [id number] 02-14-1946  Referring provider: Duanne Butler DASEN, MD Primary GI doctor: Dr. Charlanne  *Due to language barrier, an interpreter was present during the history-taking and subsequent discussion (and for part of the physical exam) with this patient.    ASSESSMENT AND PLAN:  Nausea and epigastric pain, decreased appetite, weight loss, SOB, sweating CTAP with contrast 08/09/2023 for epigastric pain shows moderate amount of residual fecal matter throughout the colon no obstruction, enlarged prostate, otherwise unremarkable.  No bowel inflammation, liver unremarkable pancreas normal spleen normal prior cholecystectomy EGD 08/25/2023 mild gastritis otherwise unremarkable EGD, negative celiac negative H. pylori no metaplasia/dysplasia reflux esophagitis negative EOE or dysplasia 10/01/2023 GES negative He was on dexilant  but this was too expensive On mobic  at this time 7.5 mg once a day likely worsening symptoms - has some sweating, SOB, chest pain and pain in left shoulder with the epigastric pain, has had relatively normal EGD, CT scan, colonoscopy - stop mobic  - add on omeprazole  40 mg BID - continue remeron  at night  suggest following up with cardiology, has appointment at 3 today - trial of flagyl  for possible SIBO  IBS constipation/diarrhea Constipation seen on CT 08/09/2023 - consider linzess  Nonexertional chest pain/DOE CT coronary in 2021 with Ca score 325 (51st percentile), 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 Echo 12/26/2022 EF 55 to 60% thought to be GERD has some sweating, SOB, chest pain and pain in left shoulder with the epigastric pain, has had relatively normal EGD, CT scan, colonoscopy, suggest follow up with cardiology  Type 2 diabetes Lab Results  Component Value Date   HGBA1C 7.1 (H) 11/01/2023   Personal history of colon polyps 12/12/2023 colonoscopy adequate prep to identify polyps only 6 mm polyp, diverticula nonbleeding  internal hemorrhoids no recall due to age.  Patient Care Team: Duanne Butler DASEN, MD as PCP - General (Family Medicine) Lonni Slain, MD as PCP - Cardiology (Cardiology)  HISTORY OF PRESENT ILLNESS: 78 y.o. male with a past medical history of  hyperlipidemia, palpitations, chronic SOB, diabetes mellitus type 2, gout and GERD.  Past cholecystectomy, HTN and others listed below presents for evaluation of epigastric pain and nausea.  I last saw the patient in the office 08/2023 for epigastric pain and nausea. Had follow-up visit most recently with Dr. Charlanne December 07, 2023  Discussed the use of AI scribe software for clinical note transcription with the patient, who gave verbal consent to proceed.  History of Present Illness            He  reports that he quit smoking about 33 years ago. His smoking use included cigarettes. He started smoking about 36 years ago. He has never used smokeless tobacco. He reports that he does not drink alcohol and does not use drugs.  Wt Readings from Last 10 Encounters:  01/03/24 208 lb (94.3 kg)  01/03/24 208 lb (94.3 kg)  12/12/23 209 lb 6.4 oz (95 kg)  12/07/23 209 lb 4 oz (94.9 kg)  11/01/23 207 lb 9.6 oz (94.2 kg)  10/25/23 209 lb 2 oz (94.9 kg)  09/12/23 205 lb 9.6 oz (93.3 kg)  08/27/23 211 lb (95.7 kg)  08/09/23 220 lb (99.8 kg)  07/16/23 224 lb 9.6 oz (101.9 kg)    RELEVANT GI HISTORY, IMAGING AND LABS: Results          09/12/2023 abdominal x-ray 1 view IMPRESSION: Normal bowel gas pattern. Small volume of formed stool in the colon.  10/01/2023  gastric emptying study Normal gastric emptying study   Abdominal CT: Significant stool burden, otherwise unremarkable (07/2023) DIAGNOSTIC Endoscopy: Revealed inflammation, no malignancy or other significant findings   EGD 09/13/2021: - Mild esophagitis but no other endoscopic esophageal abnormality to explain patient's dysphagia. Esophagus dilated. Dilated. Biopsied. -  Gastritis. Biopsied. - Erythematous duodenopathy. Biopsied. 1. Surgical [P], duodenal FRAGMENTS OF DUODENAL MUCOSA WITH NORMAL VILLOUS AND CRYPT ARCHITECTURE. MILD INFLAMMATION IS SEEN BUT NO INCREASE IN INTRAEPITHELIAL LYMPHOCYTES IS NOTED. NO DYSPLASIA OR MALIGNANCY IS SEEN. NO HISTOLOGIC EVIDENCE OF CELIAC SPRUE IS SEEN. 2. Surgical [P], gastritis MILD CHRONIC GASTRITIS. NO SIGNIFICANT ACTIVITY, DYSPLASIA OR MALIGNANCY IS SEEN ALCIAN BLUE STAIN WITH APPROPRIATE CONTROLS IS NEGATIVE FOR INTESTINAL METAPLASIA. IMMUNOHISTOCHEMISTRY FOR H. PYLORI IS NEGATIVE FOR H PYLORI-LIKE ORGANISMS. 3. Surgical [P], mid esophagus and proximal esophagus FRAGMENTS OF INFLAMED SQUAMOUS AND GLANDULAR MUCOSA WITH RARE CELL SHOWING INTESTINAL METAPLASIA CONFIRMED BY ALCIAN BLUE STAIN WITH APPROPRIATE CONTROLS. NO SIGNIFICANT NUMBER OF EOSINOPHILS ARE SEEN. NO DYSPLASIA OR MALIGNANCY IS SEEN. CLINICAL AND ENDOSCOPIC CORELATION IS REQUIRED. 4. Surgical [P], distal esophagus FRAGMENTS OF SQUAMOUS MUCOSA SHOWING MILD INFLAMMATION.. NO SIGNIFICANT NUMBER OF EOSINOPHILS, DYSPLASIA OR MALIGNANCY IS SEEN. CLINICAL AND ENDOSCOPIC CORRELATION IS REQUIRED.    RUQ sono 03/16/2021: 1. 1.6 cm hypoechoic indeterminate right hepatic lesion. Recommend further evaluation with MRI. 2. Heterogeneous liver echotexture may be related to diffuse hepatocellular disease. Please correlate clinically. 3. Cholecystectomy.   Abdominal MRI/ 04/13/2021: 1. 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 This lesion is benign and requires no further workup. CBC    Component Value Date/Time   WBC 9.0 12/07/2023 1454   RBC 4.74 12/07/2023 1454   HGB 15.4 12/07/2023 1454   HCT 45.8 12/07/2023 1454   PLT 233.0 12/07/2023 1454   MCV 96.6 12/07/2023 1454   MCH 31.9 11/01/2023 1023   MCHC 33.5 12/07/2023 1454   RDW 13.6 12/07/2023 1454   LYMPHSABS 1.2 09/12/2023 1051   MONOABS 0.4 09/12/2023 1051   EOSABS 170 11/01/2023 1023   BASOSABS  50 11/01/2023 1023   Recent Labs    08/09/23 0635 08/24/23 0642 08/24/23 1229 08/25/23 0523 09/12/23 1051 11/01/23 1023 12/07/23 1454  HGB 17.2* 16.2 15.3 16.3 16.4 16.0 15.4    CMP     Component Value Date/Time   NA 140 12/07/2023 1454   K 4.0 12/07/2023 1454   CL 103 12/07/2023 1454   CO2 30 12/07/2023 1454   GLUCOSE 116 (H) 12/07/2023 1454   BUN 20 12/07/2023 1454   CREATININE 1.14 12/07/2023 1454   CREATININE 1.25 11/01/2023 1023   CALCIUM  9.1 12/07/2023 1454   PROT 6.6 12/07/2023 1454   ALBUMIN 4.2 12/07/2023 1454   AST 28 12/07/2023 1454   ALT 43 12/07/2023 1454   ALKPHOS 73 12/07/2023 1454   BILITOT 0.4 12/07/2023 1454   GFRNONAA >60 08/25/2023 0523   GFRNONAA 61 08/27/2020 1546   GFRAA 71 08/27/2020 1546      Latest Ref Rng & Units 12/07/2023    2:54 PM 11/01/2023   10:23 AM 09/12/2023   10:51 AM  Hepatic Function  Total Protein 6.0 - 8.3 g/dL 6.6  6.5  7.3   Albumin 3.5 - 5.2 g/dL 4.2   4.4   AST 0 - 37 U/L 28  15  24    ALT 0 - 53 U/L 43  16  50   Alk Phosphatase 39 - 117 U/L 73   63   Total Bilirubin 0.2 - 1.2 mg/dL 0.4  0.7  0.8       Current Medications:   Current Outpatient Medications (Endocrine & Metabolic):    metFORMIN  (GLUCOPHAGE ) 500 MG tablet, Take 1 tablet (500 mg total) by mouth 2 (two) times daily with a meal.  Current Outpatient Medications (Cardiovascular):    nitroGLYCERIN  (NITROSTAT ) 0.4 MG SL tablet, DISSOLVE ONE TABLET UNDER THE TONGUE EVERY 5 MINUTES AS NEEDED FOR CHEST PAIN.  DO NOT EXCEED A TOTAL OF 3 DOSES IN 15 MINUTES (Patient not taking: Reported on 01/03/2024)  Current Outpatient Medications (Analgesics):    meloxicam  (MOBIC ) 7.5 MG tablet, TAKE 1 TABLET BY MOUTH EVERY DAY FOR 21 DAYS THEN AS NEEDED  Current Outpatient Medications (Other):    dicyclomine  (BENTYL ) 20 MG tablet, TAKE 1 TABLET BY MOUTH 3 TIMES A DAY AS NEEDED FOR SPASMS (Patient not taking: Reported on 01/03/2024)   mirtazapine  (REMERON ) 15 MG tablet, Take 1  tablet (15 mg total) by mouth at bedtime. (Patient not taking: Reported on 01/03/2024)   neomycin-polymyxin b-dexamethasone (MAXITROL) 3.5-10000-0.1 SUSP, SMARTSIG:In Eye(s) (Patient not taking: Reported on 01/03/2024)   omeprazole  (PRILOSEC) 40 MG capsule, Take 1 capsule (40 mg total) by mouth 2 (two) times daily before a meal.  Medical History:  Past Medical History:  Diagnosis Date   Cataract    Diabetes mellitus without complication (HCC)    x few years.   GERD (gastroesophageal reflux disease)    Glaucoma    Gout    Heart disease    HLD (hyperlipidemia)    Hypertension    Allergies:  Allergies  Allergen Reactions   Penicillins Other (See Comments)    Difficulty sleeping     Surgical History:  He  has a past surgical history that includes Cholecystectomy; Eye surgery; Upper gi endoscopy (2023); and Esophagogastroduodenoscopy (N/A, 08/25/2023). Family History:  His family history includes Heart disease in his mother; Heart failure in his father.  REVIEW OF SYSTEMS  : All other systems reviewed and negative except where noted in the History of Present Illness.  PHYSICAL EXAM: There were no vitals taken for this visit. Physical Exam          Alan JONELLE Coombs, PA-C 7:55 AM   "

## 2024-03-30 ENCOUNTER — Other Ambulatory Visit: Payer: Self-pay | Admitting: Physician Assistant
